# Patient Record
Sex: Female | Born: 1975 | Race: White | Hispanic: No | Smoking: Former smoker
Health system: Southern US, Community
[De-identification: ages and names within clinical notes are randomized; demographics above are authoritative.]

## PROBLEM LIST (undated history)

## (undated) DIAGNOSIS — N3010 Interstitial cystitis (chronic) without hematuria: Secondary | ICD-10-CM

## (undated) DIAGNOSIS — I639 Cerebral infarction, unspecified: Secondary | ICD-10-CM

## (undated) DIAGNOSIS — E785 Hyperlipidemia, unspecified: Secondary | ICD-10-CM

## (undated) DIAGNOSIS — Z889 Allergy status to unspecified drugs, medicaments and biological substances status: Secondary | ICD-10-CM

## (undated) DIAGNOSIS — J45909 Unspecified asthma, uncomplicated: Secondary | ICD-10-CM

## (undated) DIAGNOSIS — N200 Calculus of kidney: Secondary | ICD-10-CM

## (undated) DIAGNOSIS — Z8489 Family history of other specified conditions: Secondary | ICD-10-CM

## (undated) DIAGNOSIS — N809 Endometriosis, unspecified: Secondary | ICD-10-CM

## (undated) HISTORY — PX: CERVICAL BIOPSY  W/ LOOP ELECTRODE EXCISION: SUR135

## (undated) HISTORY — DX: Cerebral infarction, unspecified: I63.9

## (undated) HISTORY — PX: TUBAL LIGATION: SHX77

## (undated) HISTORY — PX: COLPOSCOPY: SHX161

## (undated) HISTORY — DX: Hyperlipidemia, unspecified: E78.5

## (undated) HISTORY — PX: LEEP: SHX91

## (undated) HISTORY — PX: ABLATION: SHX5711

## (undated) HISTORY — PX: RHINOPLASTY: SUR1284

## (undated) HISTORY — PX: CHOLECYSTECTOMY: SHX55

## (undated) HISTORY — PX: KNEE SURGERY: SHX244

## (undated) HISTORY — PX: TONSILLECTOMY: SUR1361

## (undated) HISTORY — DX: Unspecified asthma, uncomplicated: J45.909

## (undated) HISTORY — PX: CYSTOSTOMY W/ BLADDER BIOPSY: SHX1431

## (undated) HISTORY — PX: ABDOMINAL HYSTERECTOMY: SHX81

---

## 1998-10-26 ENCOUNTER — Emergency Department (HOSPITAL_COMMUNITY): Admission: EM | Admit: 1998-10-26 | Discharge: 1998-10-26 | Payer: Self-pay | Admitting: Emergency Medicine

## 1999-02-25 ENCOUNTER — Emergency Department (HOSPITAL_COMMUNITY): Admission: EM | Admit: 1999-02-25 | Discharge: 1999-02-25 | Payer: Self-pay | Admitting: Internal Medicine

## 1999-06-20 ENCOUNTER — Other Ambulatory Visit: Admission: RE | Admit: 1999-06-20 | Discharge: 1999-06-20 | Payer: Self-pay | Admitting: Obstetrics and Gynecology

## 2000-01-29 ENCOUNTER — Inpatient Hospital Stay (HOSPITAL_COMMUNITY): Admission: AD | Admit: 2000-01-29 | Discharge: 2000-01-31 | Payer: Self-pay | Admitting: *Deleted

## 2000-05-08 ENCOUNTER — Emergency Department (HOSPITAL_COMMUNITY): Admission: EM | Admit: 2000-05-08 | Discharge: 2000-05-08 | Payer: Self-pay | Admitting: Emergency Medicine

## 2000-11-26 ENCOUNTER — Other Ambulatory Visit: Admission: RE | Admit: 2000-11-26 | Discharge: 2000-11-26 | Payer: Self-pay | Admitting: Obstetrics and Gynecology

## 2000-12-13 ENCOUNTER — Emergency Department (HOSPITAL_COMMUNITY): Admission: EM | Admit: 2000-12-13 | Discharge: 2000-12-13 | Payer: Self-pay | Admitting: Emergency Medicine

## 2001-03-10 ENCOUNTER — Emergency Department (HOSPITAL_COMMUNITY): Admission: EM | Admit: 2001-03-10 | Discharge: 2001-03-10 | Payer: Self-pay | Admitting: Emergency Medicine

## 2001-08-24 ENCOUNTER — Encounter: Payer: Self-pay | Admitting: Emergency Medicine

## 2001-08-24 ENCOUNTER — Emergency Department (HOSPITAL_COMMUNITY): Admission: EM | Admit: 2001-08-24 | Discharge: 2001-08-24 | Payer: Self-pay | Admitting: Emergency Medicine

## 2001-11-26 ENCOUNTER — Other Ambulatory Visit: Admission: RE | Admit: 2001-11-26 | Discharge: 2001-11-26 | Payer: Self-pay | Admitting: Obstetrics & Gynecology

## 2002-01-01 ENCOUNTER — Encounter (INDEPENDENT_AMBULATORY_CARE_PROVIDER_SITE_OTHER): Payer: Self-pay

## 2002-01-01 ENCOUNTER — Observation Stay (HOSPITAL_COMMUNITY): Admission: AD | Admit: 2002-01-01 | Discharge: 2002-01-01 | Payer: Self-pay | Admitting: Obstetrics and Gynecology

## 2002-01-21 ENCOUNTER — Emergency Department (HOSPITAL_COMMUNITY): Admission: EM | Admit: 2002-01-21 | Discharge: 2002-01-21 | Payer: Self-pay | Admitting: Emergency Medicine

## 2002-11-15 ENCOUNTER — Emergency Department (HOSPITAL_COMMUNITY): Admission: EM | Admit: 2002-11-15 | Discharge: 2002-11-16 | Payer: Self-pay | Admitting: Emergency Medicine

## 2002-11-16 ENCOUNTER — Encounter: Payer: Self-pay | Admitting: Emergency Medicine

## 2002-11-18 ENCOUNTER — Encounter: Payer: Self-pay | Admitting: Internal Medicine

## 2002-11-18 LAB — CONVERTED CEMR LAB

## 2002-12-21 ENCOUNTER — Inpatient Hospital Stay (HOSPITAL_COMMUNITY): Admission: EM | Admit: 2002-12-21 | Discharge: 2002-12-29 | Payer: Self-pay | Admitting: Psychiatry

## 2003-03-07 ENCOUNTER — Emergency Department (HOSPITAL_COMMUNITY): Admission: EM | Admit: 2003-03-07 | Discharge: 2003-03-08 | Payer: Self-pay

## 2003-11-22 ENCOUNTER — Emergency Department (HOSPITAL_COMMUNITY): Admission: EM | Admit: 2003-11-22 | Discharge: 2003-11-22 | Payer: Self-pay | Admitting: Emergency Medicine

## 2004-07-29 ENCOUNTER — Emergency Department (HOSPITAL_COMMUNITY): Admission: EM | Admit: 2004-07-29 | Discharge: 2004-07-29 | Payer: Self-pay | Admitting: Emergency Medicine

## 2004-08-23 ENCOUNTER — Emergency Department (HOSPITAL_COMMUNITY): Admission: EM | Admit: 2004-08-23 | Discharge: 2004-08-24 | Payer: Self-pay | Admitting: *Deleted

## 2004-10-23 ENCOUNTER — Emergency Department (HOSPITAL_COMMUNITY): Admission: EM | Admit: 2004-10-23 | Discharge: 2004-10-23 | Payer: Self-pay | Admitting: Emergency Medicine

## 2005-02-06 ENCOUNTER — Emergency Department (HOSPITAL_COMMUNITY): Admission: EM | Admit: 2005-02-06 | Discharge: 2005-02-06 | Payer: Self-pay | Admitting: Emergency Medicine

## 2005-02-20 ENCOUNTER — Emergency Department (HOSPITAL_COMMUNITY): Admission: EM | Admit: 2005-02-20 | Discharge: 2005-02-21 | Payer: Self-pay | Admitting: Emergency Medicine

## 2005-02-25 ENCOUNTER — Ambulatory Visit: Payer: Self-pay | Admitting: Internal Medicine

## 2005-03-01 ENCOUNTER — Encounter: Admission: RE | Admit: 2005-03-01 | Discharge: 2005-03-01 | Payer: Self-pay | Admitting: Internal Medicine

## 2005-04-26 ENCOUNTER — Ambulatory Visit: Payer: Self-pay | Admitting: Internal Medicine

## 2005-05-02 ENCOUNTER — Other Ambulatory Visit: Admission: RE | Admit: 2005-05-02 | Discharge: 2005-05-02 | Payer: Self-pay | Admitting: Obstetrics and Gynecology

## 2005-05-03 ENCOUNTER — Other Ambulatory Visit: Admission: RE | Admit: 2005-05-03 | Discharge: 2005-05-03 | Payer: Self-pay | Admitting: Obstetrics and Gynecology

## 2005-06-08 ENCOUNTER — Observation Stay (HOSPITAL_COMMUNITY): Admission: AD | Admit: 2005-06-08 | Discharge: 2005-06-09 | Payer: Self-pay | Admitting: Obstetrics and Gynecology

## 2005-10-01 ENCOUNTER — Ambulatory Visit (HOSPITAL_COMMUNITY): Admission: RE | Admit: 2005-10-01 | Discharge: 2005-10-01 | Payer: Self-pay | Admitting: Obstetrics and Gynecology

## 2005-10-11 ENCOUNTER — Inpatient Hospital Stay (HOSPITAL_COMMUNITY): Admission: AD | Admit: 2005-10-11 | Discharge: 2005-10-11 | Payer: Self-pay | Admitting: Obstetrics and Gynecology

## 2005-12-14 ENCOUNTER — Inpatient Hospital Stay (HOSPITAL_COMMUNITY): Admission: AD | Admit: 2005-12-14 | Discharge: 2005-12-14 | Payer: Self-pay | Admitting: Obstetrics and Gynecology

## 2005-12-19 ENCOUNTER — Inpatient Hospital Stay (HOSPITAL_COMMUNITY): Admission: AD | Admit: 2005-12-19 | Discharge: 2005-12-22 | Payer: Self-pay | Admitting: Obstetrics and Gynecology

## 2005-12-20 ENCOUNTER — Encounter (INDEPENDENT_AMBULATORY_CARE_PROVIDER_SITE_OTHER): Payer: Self-pay | Admitting: *Deleted

## 2006-01-02 ENCOUNTER — Ambulatory Visit (HOSPITAL_COMMUNITY): Admission: RE | Admit: 2006-01-02 | Discharge: 2006-01-02 | Payer: Self-pay | Admitting: Obstetrics and Gynecology

## 2006-02-21 ENCOUNTER — Emergency Department (HOSPITAL_COMMUNITY): Admission: EM | Admit: 2006-02-21 | Discharge: 2006-02-21 | Payer: Self-pay | Admitting: Emergency Medicine

## 2006-03-19 ENCOUNTER — Ambulatory Visit: Payer: Self-pay | Admitting: Cardiology

## 2006-03-19 ENCOUNTER — Ambulatory Visit: Payer: Self-pay | Admitting: Internal Medicine

## 2006-03-24 ENCOUNTER — Ambulatory Visit (HOSPITAL_BASED_OUTPATIENT_CLINIC_OR_DEPARTMENT_OTHER): Admission: RE | Admit: 2006-03-24 | Discharge: 2006-03-24 | Payer: Self-pay | Admitting: Urology

## 2006-03-27 ENCOUNTER — Ambulatory Visit (HOSPITAL_COMMUNITY): Admission: RE | Admit: 2006-03-27 | Discharge: 2006-03-27 | Payer: Self-pay | Admitting: Urology

## 2006-04-11 ENCOUNTER — Emergency Department (HOSPITAL_COMMUNITY): Admission: EM | Admit: 2006-04-11 | Discharge: 2006-04-11 | Payer: Self-pay | Admitting: Emergency Medicine

## 2006-09-16 ENCOUNTER — Ambulatory Visit: Payer: Self-pay | Admitting: Internal Medicine

## 2006-10-17 ENCOUNTER — Ambulatory Visit: Payer: Self-pay | Admitting: Internal Medicine

## 2006-10-17 LAB — CONVERTED CEMR LAB
ALT: 11 units/L (ref 0–40)
AST: 14 units/L (ref 0–37)
Alkaline Phosphatase: 51 units/L (ref 39–117)
Basophils Relative: 0.9 % (ref 0.0–1.0)
Bilirubin Urine: NEGATIVE
Calcium: 9.2 mg/dL (ref 8.4–10.5)
Chloride: 107 meq/L (ref 96–112)
Cholesterol: 220 mg/dL (ref 0–200)
Crystals: NEGATIVE
Eosinophil percent: 9 % — ABNORMAL HIGH (ref 0.0–5.0)
GFR calc non Af Amer: 89 mL/min
Glomerular Filtration Rate, Af Am: 108 mL/min/{1.73_m2}
Glucose, Bld: 93 mg/dL (ref 70–99)
HCT: 43.5 % (ref 36.0–46.0)
HDL: 40.6 mg/dL (ref >39.0)
Hemoglobin, Urine: NEGATIVE
LDL DIRECT: 172.1 mg/dL
MCV: 88 fL (ref 78.0–100.0)
Mucus, UA: NEGATIVE
Nitrite: NEGATIVE
Platelets: 269 10*3/uL (ref 150–400)
Potassium: 4.6 meq/L (ref 3.5–5.1)
RBC: 4.95 M/uL (ref 3.87–5.11)
Sodium: 140 meq/L (ref 135–145)
Total Protein, Urine: NEGATIVE mg/dL
Total Protein: 6.5 g/dL (ref 6.0–8.3)
WBC: 6.1 10*3/uL (ref 4.5–10.5)
pH: 7.5 (ref 5.0–8.0)

## 2006-10-23 ENCOUNTER — Ambulatory Visit: Payer: Self-pay | Admitting: Internal Medicine

## 2006-12-22 ENCOUNTER — Emergency Department (HOSPITAL_COMMUNITY): Admission: EM | Admit: 2006-12-22 | Discharge: 2006-12-22 | Payer: Self-pay | Admitting: Emergency Medicine

## 2007-01-30 ENCOUNTER — Ambulatory Visit (HOSPITAL_COMMUNITY): Admission: RE | Admit: 2007-01-30 | Discharge: 2007-01-30 | Payer: Self-pay | Admitting: Obstetrics and Gynecology

## 2007-02-25 ENCOUNTER — Ambulatory Visit (HOSPITAL_COMMUNITY): Admission: RE | Admit: 2007-02-25 | Discharge: 2007-02-25 | Payer: Self-pay | Admitting: Obstetrics and Gynecology

## 2007-02-25 ENCOUNTER — Encounter (INDEPENDENT_AMBULATORY_CARE_PROVIDER_SITE_OTHER): Payer: Self-pay | Admitting: *Deleted

## 2007-07-23 ENCOUNTER — Ambulatory Visit: Payer: Self-pay | Admitting: Internal Medicine

## 2007-07-30 ENCOUNTER — Encounter: Payer: Self-pay | Admitting: Internal Medicine

## 2007-07-30 DIAGNOSIS — F3289 Other specified depressive episodes: Secondary | ICD-10-CM | POA: Insufficient documentation

## 2007-07-30 DIAGNOSIS — G4700 Insomnia, unspecified: Secondary | ICD-10-CM

## 2007-07-30 DIAGNOSIS — G43909 Migraine, unspecified, not intractable, without status migrainosus: Secondary | ICD-10-CM | POA: Insufficient documentation

## 2007-07-30 DIAGNOSIS — F329 Major depressive disorder, single episode, unspecified: Secondary | ICD-10-CM

## 2007-07-30 DIAGNOSIS — N3010 Interstitial cystitis (chronic) without hematuria: Secondary | ICD-10-CM | POA: Insufficient documentation

## 2007-07-30 DIAGNOSIS — E785 Hyperlipidemia, unspecified: Secondary | ICD-10-CM | POA: Insufficient documentation

## 2007-07-30 DIAGNOSIS — J309 Allergic rhinitis, unspecified: Secondary | ICD-10-CM | POA: Insufficient documentation

## 2007-07-30 DIAGNOSIS — J45909 Unspecified asthma, uncomplicated: Secondary | ICD-10-CM

## 2007-07-30 DIAGNOSIS — F32A Depression, unspecified: Secondary | ICD-10-CM | POA: Insufficient documentation

## 2007-07-30 DIAGNOSIS — R609 Edema, unspecified: Secondary | ICD-10-CM

## 2007-07-30 DIAGNOSIS — F419 Anxiety disorder, unspecified: Secondary | ICD-10-CM

## 2007-08-06 ENCOUNTER — Emergency Department (HOSPITAL_COMMUNITY): Admission: EM | Admit: 2007-08-06 | Discharge: 2007-08-06 | Payer: Self-pay

## 2007-09-14 ENCOUNTER — Emergency Department (HOSPITAL_COMMUNITY): Admission: EM | Admit: 2007-09-14 | Discharge: 2007-09-14 | Payer: Self-pay | Admitting: Emergency Medicine

## 2007-10-06 ENCOUNTER — Emergency Department (HOSPITAL_COMMUNITY): Admission: EM | Admit: 2007-10-06 | Discharge: 2007-10-06 | Payer: Self-pay | Admitting: Emergency Medicine

## 2007-10-19 DIAGNOSIS — Z87442 Personal history of urinary calculi: Secondary | ICD-10-CM | POA: Insufficient documentation

## 2007-11-24 ENCOUNTER — Emergency Department (HOSPITAL_COMMUNITY): Admission: EM | Admit: 2007-11-24 | Discharge: 2007-11-24 | Payer: Self-pay | Admitting: Emergency Medicine

## 2007-12-29 ENCOUNTER — Emergency Department (HOSPITAL_COMMUNITY): Admission: EM | Admit: 2007-12-29 | Discharge: 2007-12-29 | Payer: Self-pay | Admitting: Emergency Medicine

## 2008-01-06 ENCOUNTER — Emergency Department (HOSPITAL_COMMUNITY): Admission: EM | Admit: 2008-01-06 | Discharge: 2008-01-06 | Payer: Self-pay | Admitting: Emergency Medicine

## 2008-04-07 ENCOUNTER — Ambulatory Visit: Payer: Self-pay | Admitting: Internal Medicine

## 2008-04-07 DIAGNOSIS — R109 Unspecified abdominal pain: Secondary | ICD-10-CM

## 2008-04-07 LAB — CONVERTED CEMR LAB
Bilirubin Urine: NEGATIVE
CO2: 29 meq/L (ref 19–32)
Calcium: 9.4 mg/dL (ref 8.4–10.5)
Creatinine, Ser: 0.8 mg/dL (ref 0.4–1.2)
Crystals: NEGATIVE
GFR calc Af Amer: 107 mL/min
Hemoglobin, Urine: NEGATIVE
Nitrite: NEGATIVE
Urobilinogen, UA: 0.2 (ref 0.0–1.0)

## 2008-04-13 ENCOUNTER — Encounter: Payer: Self-pay | Admitting: Internal Medicine

## 2008-06-23 ENCOUNTER — Emergency Department (HOSPITAL_COMMUNITY): Admission: EM | Admit: 2008-06-23 | Discharge: 2008-06-23 | Payer: Self-pay | Admitting: Emergency Medicine

## 2008-06-29 ENCOUNTER — Emergency Department (HOSPITAL_COMMUNITY): Admission: EM | Admit: 2008-06-29 | Discharge: 2008-06-29 | Payer: Self-pay | Admitting: Emergency Medicine

## 2008-09-05 ENCOUNTER — Emergency Department (HOSPITAL_COMMUNITY): Admission: EM | Admit: 2008-09-05 | Discharge: 2008-09-06 | Payer: Self-pay | Admitting: Emergency Medicine

## 2008-11-28 ENCOUNTER — Ambulatory Visit: Payer: Self-pay | Admitting: Internal Medicine

## 2008-12-07 ENCOUNTER — Telehealth (INDEPENDENT_AMBULATORY_CARE_PROVIDER_SITE_OTHER): Payer: Self-pay | Admitting: *Deleted

## 2008-12-07 ENCOUNTER — Emergency Department (HOSPITAL_COMMUNITY): Admission: EM | Admit: 2008-12-07 | Discharge: 2008-12-07 | Payer: Self-pay | Admitting: Emergency Medicine

## 2008-12-08 ENCOUNTER — Ambulatory Visit: Payer: Self-pay | Admitting: Internal Medicine

## 2008-12-08 ENCOUNTER — Encounter (INDEPENDENT_AMBULATORY_CARE_PROVIDER_SITE_OTHER): Payer: Self-pay | Admitting: *Deleted

## 2008-12-13 ENCOUNTER — Ambulatory Visit: Payer: Self-pay | Admitting: Internal Medicine

## 2008-12-13 LAB — CONVERTED CEMR LAB: LDL Goal: 172 mg/dL

## 2008-12-30 ENCOUNTER — Telehealth: Payer: Self-pay | Admitting: Internal Medicine

## 2009-01-17 ENCOUNTER — Encounter: Payer: Self-pay | Admitting: Internal Medicine

## 2009-04-09 IMAGING — CT CT PELVIS W/O CM
1 of 2 series · 15 of 32 positions shown, 19 images · IV contrast (agent unspecified)
Comparison: 03/27/06.

CLINICAL DATA: Left flank pain since 3 a.m.  Assess for stone.  History of previous bladder repair.
 ABDOMEN CT WITHOUT CONTRAST:
TECHNIQUE: Multidetector CT imaging of the abdomen was performed following the standard protocol without IV contrast.
TECHNIQUE: Multidetector CT imaging of the pelvis was performed following the standard protocol without IV contrast.

[Series 2: 160 stone 5.0 b40f st · axial · 0.77mm/px · z∈[-585,-225]mm · 15 of 81 slices shown, 19 images]
[im 6/81  soft-tissue]
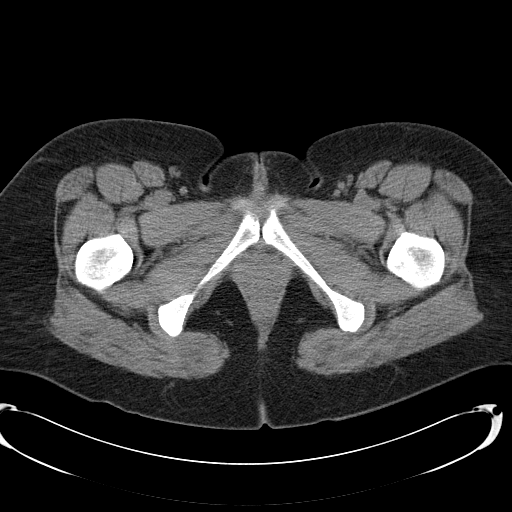
[im 6/81  bone]
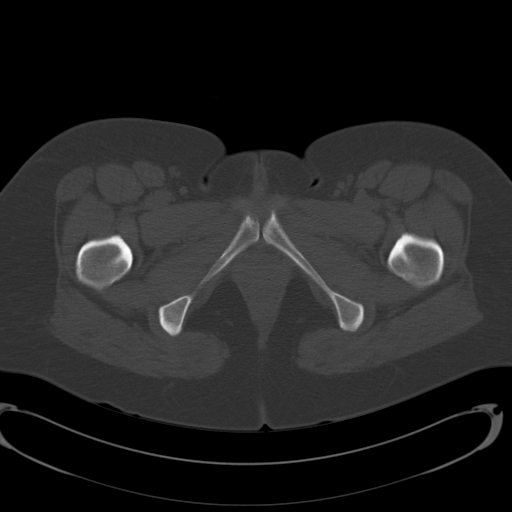
[im 12/81  soft-tissue]
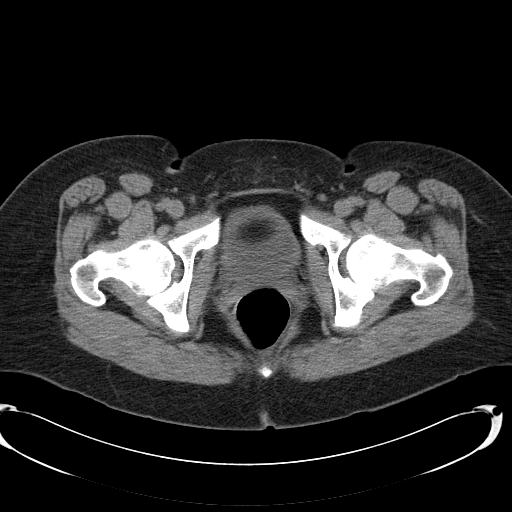
[im 18/81  soft-tissue]
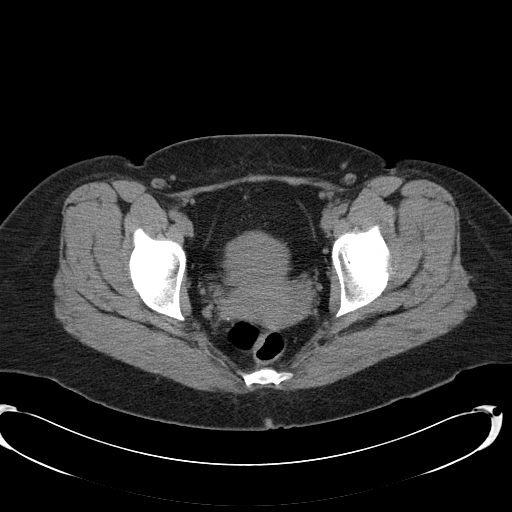
[im 23/81  soft-tissue]
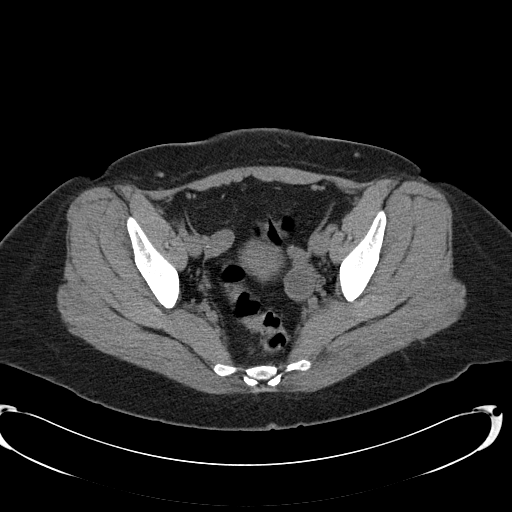
[im 29/81  soft-tissue]
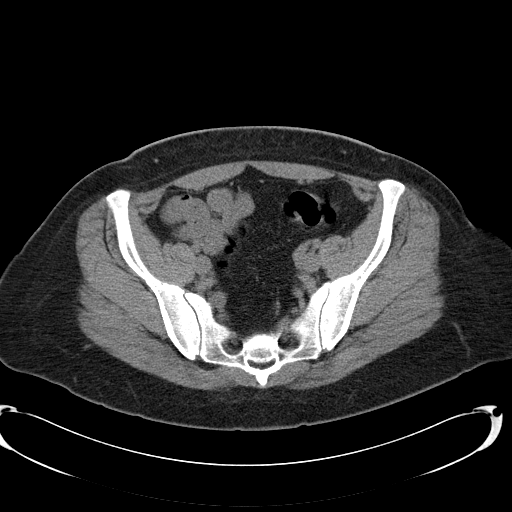
[im 35/81  soft-tissue]
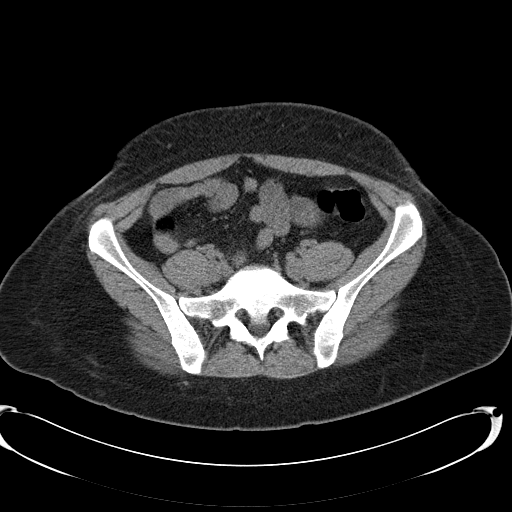
[im 41/81  soft-tissue]
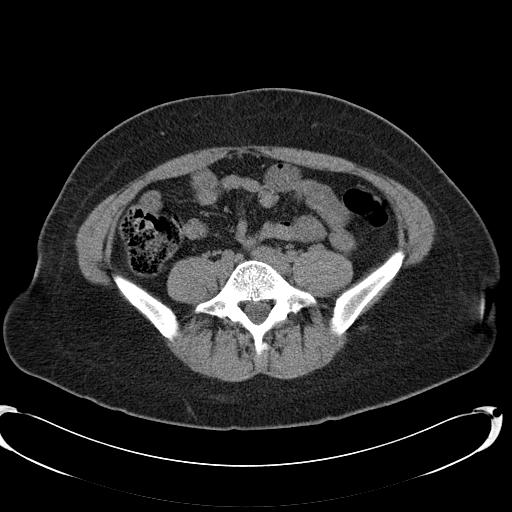
[im 46/81  soft-tissue]
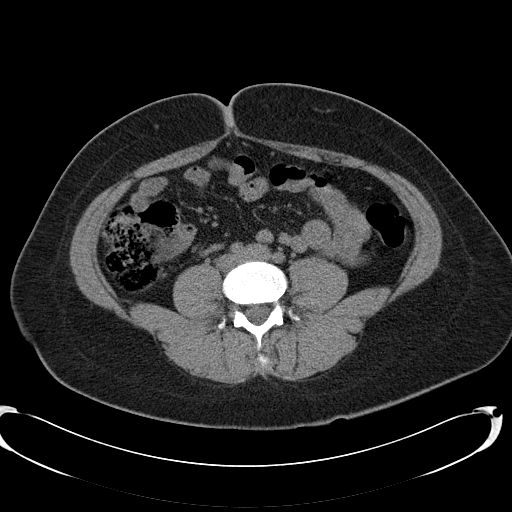
[im 52/81  soft-tissue]
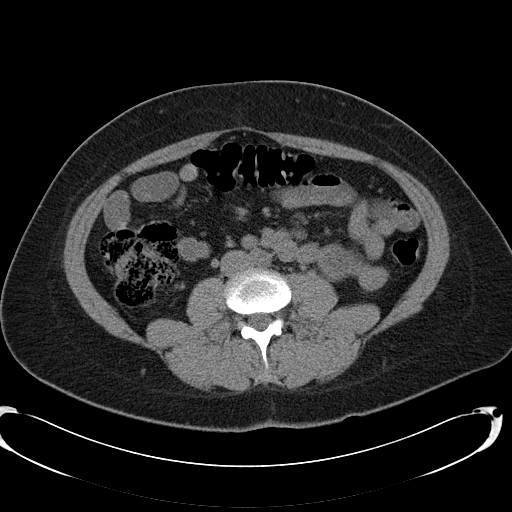
[im 52/81  bone]
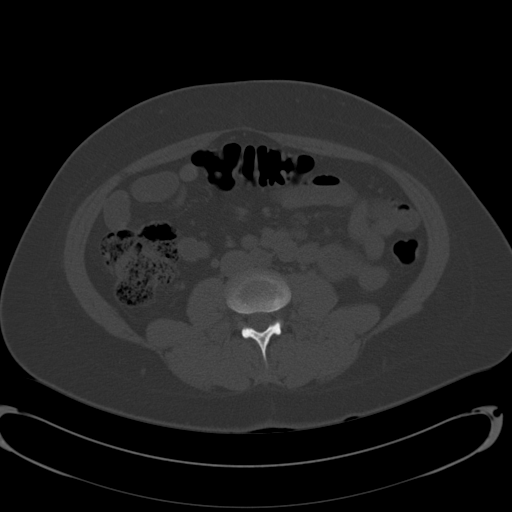
[im 58/81  soft-tissue]
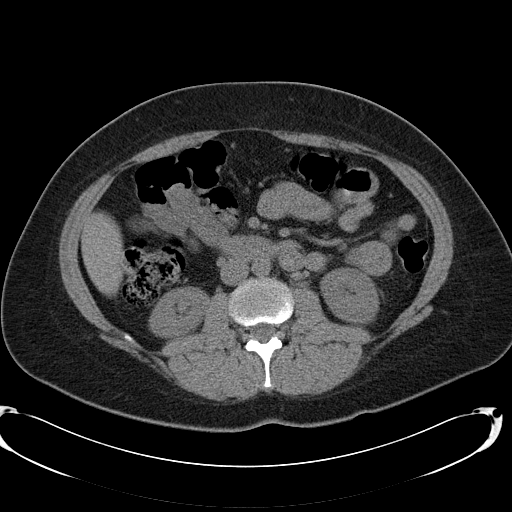
[im 63/81  soft-tissue]
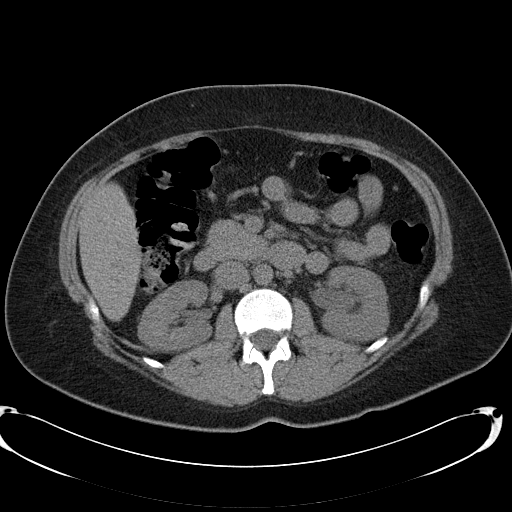
[im 69/81  soft-tissue]
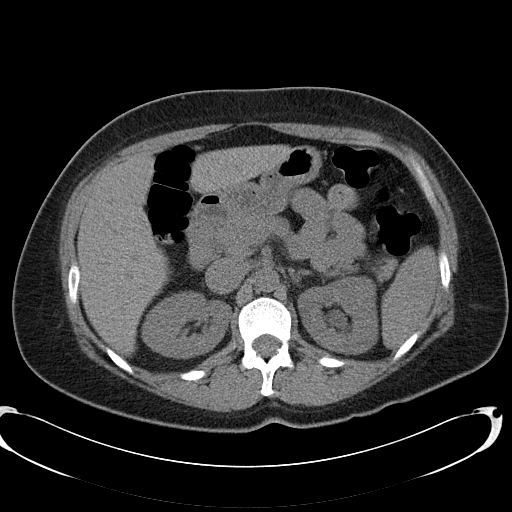
[im 69/81  lung]
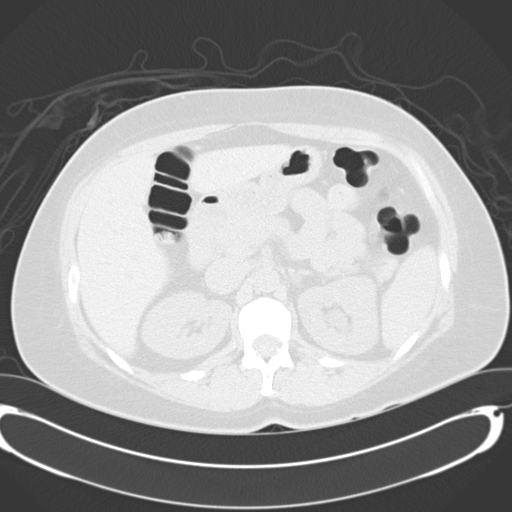
[im 72/81  lung]
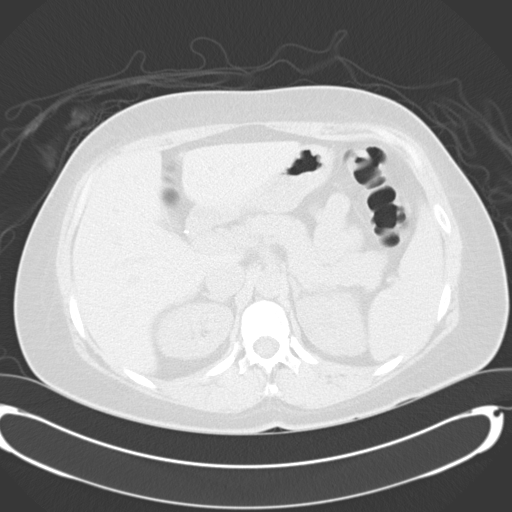
[im 75/81  soft-tissue]
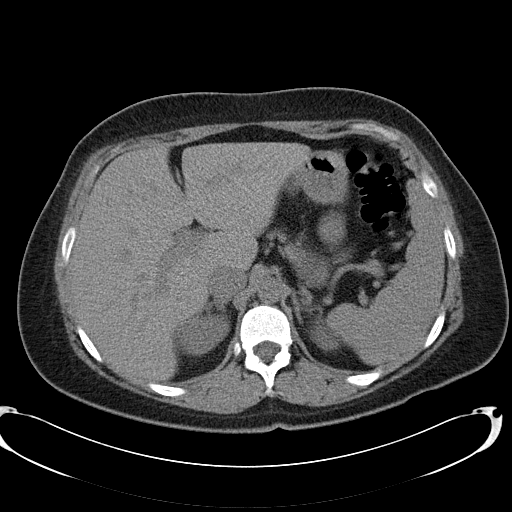
[im 75/81  lung]
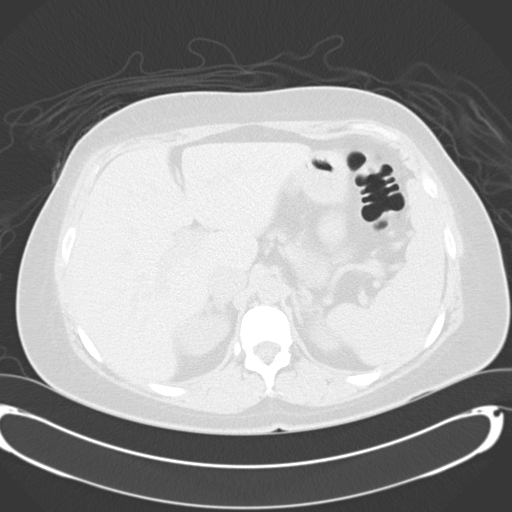
[im 78/81  lung]
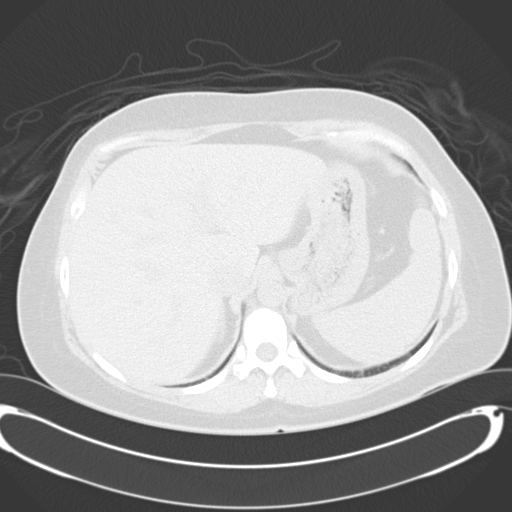

[15 of 32 positions shown; findings below may reference images not displayed]

FINDINGS: The visualized portion of the liver appears normal.  The patient has had cholecystectomy.  The spleen is normal in size, shape, and position.  The pancreas is unremarkable.  Both adrenal glands are normal.  The right kidney is normal.  The left kidney is swollen and shows hydronephrosis of a moderate degree. There is a 1-2 mm stone in an upper pole calix.  There is a 3 mm stone within the proximal ureter apparently responsible for the obstruction.
 The aorta and IVC are unremarkable.  No mass or adenopathy.  No free fluid or air.  No primary bowel pathology is evident.
IMPRESSION: Moderate hydronephrosis on the left because of a 3 mm stone in the proximal left ureter.  There is a 1-2 mm stone in an upper pole calix on the left, nonobstructive.
 PELVIS CT WITHOUT CONTRAST:
FINDINGS: No free fluid, mass, or adenopathy.  No sign of a stone in the pelvic portion of the examination.  There are a few phleboliths which appear the same as was seen previously.  There is a cyst associated with the left ovary measuring a few centimeters in size.
IMPRESSION: No urinary tract stone disease evident in the pelvis.  2 cm left ovarian cyst.

## 2009-05-26 ENCOUNTER — Ambulatory Visit: Payer: Self-pay | Admitting: Internal Medicine

## 2009-05-26 DIAGNOSIS — D239 Other benign neoplasm of skin, unspecified: Secondary | ICD-10-CM | POA: Insufficient documentation

## 2009-05-26 DIAGNOSIS — F17210 Nicotine dependence, cigarettes, uncomplicated: Secondary | ICD-10-CM

## 2009-08-04 ENCOUNTER — Telehealth: Payer: Self-pay | Admitting: Internal Medicine

## 2009-08-26 ENCOUNTER — Emergency Department (HOSPITAL_COMMUNITY): Admission: EM | Admit: 2009-08-26 | Discharge: 2009-08-26 | Payer: Self-pay | Admitting: Emergency Medicine

## 2010-03-06 ENCOUNTER — Emergency Department (HOSPITAL_COMMUNITY): Admission: EM | Admit: 2010-03-06 | Discharge: 2010-03-06 | Payer: Self-pay | Admitting: Emergency Medicine

## 2010-06-11 ENCOUNTER — Emergency Department (HOSPITAL_COMMUNITY): Admission: EM | Admit: 2010-06-11 | Discharge: 2010-06-11 | Payer: Self-pay | Admitting: Emergency Medicine

## 2010-06-18 ENCOUNTER — Emergency Department (HOSPITAL_COMMUNITY): Admission: EM | Admit: 2010-06-18 | Discharge: 2010-06-18 | Payer: Self-pay | Admitting: Family Medicine

## 2010-09-14 ENCOUNTER — Emergency Department (HOSPITAL_COMMUNITY): Admission: EM | Admit: 2010-09-14 | Discharge: 2010-09-14 | Payer: Self-pay | Admitting: Emergency Medicine

## 2010-12-09 ENCOUNTER — Encounter: Payer: Self-pay | Admitting: Internal Medicine

## 2011-01-17 ENCOUNTER — Emergency Department (HOSPITAL_COMMUNITY)
Admission: EM | Admit: 2011-01-17 | Discharge: 2011-01-18 | Disposition: A | Discharge status: Home / Self Care | Payer: Medicaid Other | Attending: Emergency Medicine | Admitting: Emergency Medicine

## 2011-01-17 ENCOUNTER — Emergency Department (HOSPITAL_COMMUNITY): Payer: Medicaid Other

## 2011-01-17 DIAGNOSIS — Z8742 Personal history of other diseases of the female genital tract: Secondary | ICD-10-CM | POA: Insufficient documentation

## 2011-01-17 DIAGNOSIS — N72 Inflammatory disease of cervix uteri: Secondary | ICD-10-CM | POA: Insufficient documentation

## 2011-01-17 DIAGNOSIS — R1032 Left lower quadrant pain: Secondary | ICD-10-CM | POA: Insufficient documentation

## 2011-01-17 DIAGNOSIS — R112 Nausea with vomiting, unspecified: Secondary | ICD-10-CM | POA: Insufficient documentation

## 2011-01-17 DIAGNOSIS — Z87442 Personal history of urinary calculi: Secondary | ICD-10-CM | POA: Insufficient documentation

## 2011-01-17 LAB — DIFFERENTIAL
Basophils Absolute: 0.1 10*3/uL (ref 0.0–0.1)
Basophils Relative: 1 % (ref 0–1)
Neutro Abs: 4.4 10*3/uL (ref 1.7–7.7)
Neutrophils Relative %: 45 % (ref 43–77)

## 2011-01-17 LAB — URINE MICROSCOPIC-ADD ON

## 2011-01-17 LAB — URINALYSIS, ROUTINE W REFLEX MICROSCOPIC
Bilirubin Urine: NEGATIVE
Hgb urine dipstick: NEGATIVE
Ketones, ur: NEGATIVE mg/dL
Specific Gravity, Urine: 1.018 (ref 1.005–1.030)
Urobilinogen, UA: 1 mg/dL (ref 0.0–1.0)

## 2011-01-17 LAB — BASIC METABOLIC PANEL
CO2: 23 mEq/L (ref 19–32)
Chloride: 108 mEq/L (ref 96–112)
GFR calc Af Amer: >60 mL/min (ref >60)
Potassium: 3.5 mEq/L (ref 3.5–5.1)
Sodium: 140 mEq/L (ref 135–145)

## 2011-01-17 LAB — CBC
Hemoglobin: 15.6 g/dL — ABNORMAL HIGH (ref 12.0–15.0)
RBC: 5.05 MIL/uL (ref 3.87–5.11)

## 2011-01-17 LAB — WET PREP, GENITAL
Clue Cells Wet Prep HPF POC: NONE SEEN
Trich, Wet Prep: NONE SEEN

## 2011-01-18 LAB — GC/CHLAMYDIA PROBE AMP, GENITAL
Chlamydia, DNA Probe: NEGATIVE
GC Probe Amp, Genital: NEGATIVE

## 2011-01-29 ENCOUNTER — Emergency Department (HOSPITAL_COMMUNITY)
Admission: EM | Admit: 2011-01-29 | Discharge: 2011-01-29 | Disposition: A | Discharge status: Home / Self Care | Payer: Medicaid Other | Attending: Emergency Medicine | Admitting: Emergency Medicine

## 2011-01-29 DIAGNOSIS — R112 Nausea with vomiting, unspecified: Secondary | ICD-10-CM | POA: Insufficient documentation

## 2011-01-29 DIAGNOSIS — R1032 Left lower quadrant pain: Secondary | ICD-10-CM | POA: Insufficient documentation

## 2011-01-29 LAB — CBC
MCV: 88.8 fL (ref 78.0–100.0)
Platelets: 212 10*3/uL (ref 150–400)
RDW: 12.5 % (ref 11.5–15.5)
WBC: 6.7 10*3/uL (ref 4.0–10.5)

## 2011-01-29 LAB — COMPREHENSIVE METABOLIC PANEL
Albumin: 4 g/dL (ref 3.5–5.2)
BUN: 7 mg/dL (ref 6–23)
CO2: 25 mEq/L (ref 19–32)
Chloride: 106 mEq/L (ref 96–112)
Creatinine, Ser: 0.85 mg/dL (ref 0.4–1.2)
GFR calc non Af Amer: >60 mL/min (ref >60)
Glucose, Bld: 93 mg/dL (ref 70–99)
Total Bilirubin: 0.6 mg/dL (ref 0.3–1.2)

## 2011-01-29 LAB — DIFFERENTIAL
Basophils Absolute: 0.1 10*3/uL (ref 0.0–0.1)
Eosinophils Absolute: 1 10*3/uL — ABNORMAL HIGH (ref 0.0–0.7)
Eosinophils Relative: 15 % — ABNORMAL HIGH (ref 0–5)
Lymphs Abs: 2.3 10*3/uL (ref 0.7–4.0)

## 2011-01-29 LAB — URINALYSIS, ROUTINE W REFLEX MICROSCOPIC
Bilirubin Urine: NEGATIVE
Nitrite: NEGATIVE
Protein, ur: NEGATIVE mg/dL
Specific Gravity, Urine: 1.015 (ref 1.005–1.030)
Urobilinogen, UA: 0.2 mg/dL (ref 0.0–1.0)

## 2011-01-29 LAB — LIPASE, BLOOD: Lipase: 28 U/L (ref 11–59)

## 2011-01-29 LAB — URINE MICROSCOPIC-ADD ON

## 2011-01-30 LAB — DIFFERENTIAL
Basophils Absolute: 0.1 K/uL (ref 0.0–0.1)
Basophils Relative: 1 % (ref 0–1)
Eosinophils Absolute: 1.3 K/uL — ABNORMAL HIGH (ref 0.0–0.7)
Eosinophils Relative: 14 % — ABNORMAL HIGH (ref 0–5)
Lymphocytes Relative: 40 % (ref 12–46)
Lymphs Abs: 3.6 10*3/uL (ref 0.7–4.0)
Monocytes Absolute: 0.5 10*3/uL (ref 0.1–1.0)
Monocytes Relative: 5 % (ref 3–12)
Neutro Abs: 3.4 10*3/uL (ref 1.7–7.7)
Neutrophils Relative %: 39 % — ABNORMAL LOW (ref 43–77)

## 2011-01-30 LAB — URINALYSIS, ROUTINE W REFLEX MICROSCOPIC
Bilirubin Urine: NEGATIVE
Glucose, UA: NEGATIVE mg/dL
Hgb urine dipstick: NEGATIVE
Ketones, ur: NEGATIVE mg/dL
Nitrite: NEGATIVE
Protein, ur: NEGATIVE mg/dL
Specific Gravity, Urine: 1.015 (ref 1.005–1.030)
Urobilinogen, UA: 0.2 mg/dL (ref 0.0–1.0)
pH: 6.5 (ref 5.0–8.0)

## 2011-01-30 LAB — BASIC METABOLIC PANEL WITH GFR
CO2: 24 meq/L (ref 19–32)
Chloride: 109 meq/L (ref 96–112)
GFR calc non Af Amer: >60 mL/min (ref >60)
Glucose, Bld: 97 mg/dL (ref 70–99)
Potassium: 3.5 meq/L (ref 3.5–5.1)
Sodium: 140 meq/L (ref 135–145)

## 2011-01-30 LAB — CBC
HCT: 42.6 % (ref 36.0–46.0)
Hemoglobin: 14.9 g/dL (ref 12.0–15.0)
MCH: 31.5 pg (ref 26.0–34.0)
MCHC: 34.9 g/dL (ref 30.0–36.0)
MCV: 90.3 fL (ref 78.0–100.0)
Platelets: 254 10*3/uL (ref 150–400)
RBC: 4.72 MIL/uL (ref 3.87–5.11)
RDW: 12.9 % (ref 11.5–15.5)
WBC: 8.8 10*3/uL (ref 4.0–10.5)

## 2011-01-30 LAB — LIPASE, BLOOD: Lipase: 30 U/L (ref 11–59)

## 2011-01-30 LAB — BASIC METABOLIC PANEL
BUN: 10 mg/dL (ref 6–23)
Calcium: 9 mg/dL (ref 8.4–10.5)
Creatinine, Ser: 0.73 mg/dL (ref 0.4–1.2)
GFR calc Af Amer: >60 mL/min (ref >60)

## 2011-03-04 LAB — URINALYSIS, ROUTINE W REFLEX MICROSCOPIC
Bilirubin Urine: NEGATIVE
Glucose, UA: NEGATIVE mg/dL
Hgb urine dipstick: NEGATIVE
Ketones, ur: NEGATIVE mg/dL
Nitrite: NEGATIVE
Protein, ur: NEGATIVE mg/dL
Specific Gravity, Urine: 1.02 (ref 1.005–1.030)
Urobilinogen, UA: 0.2 mg/dL (ref 0.0–1.0)
pH: 7 (ref 5.0–8.0)

## 2011-03-04 LAB — PREGNANCY, URINE: Preg Test, Ur: NEGATIVE

## 2011-04-05 NOTE — Op Note (Signed)
NAMEHANNAH, STRADER              ACCOUNT NO.:  [id number]   MEDICAL RECORD NO.:  [id number]          PATIENT TYPE:  INP   LOCATION:  9137                          FACILITY:  [facility]   PHYSICIAN:  Ilda Mori, M.D.   DATE OF BIRTH:  May 19, 1976   DATE OF PROCEDURE:  12/20/2005  DATE OF DISCHARGE:                                 OPERATIVE REPORT   PREOPERATIVE DIAGNOSES:  Multiparity and voluntary sterilization.   POSTOPERATIVE DIAGNOSIS:  Multiparity and voluntary sterilization.   PROCEDURE:  Postpartum bilateral partial salpingectomy for sterilization.   SURGEON:  Dr. Ilda Mori.   ANESTHESIA:  Epidural.   ESTIMATED BLOOD LOSS:  Minimal.   FINDINGS:  Normal-appearing fallopian tubes.   COMPLICATIONS:  None.   SPECIMENS:  Portions of right and left fallopian tube.   INDICATIONS:  This is a 35 year old, gravida 3, para 3 who is approximately  3 hours status post spontaneous vaginal delivery who requested permanent  sterilization. A failure rate of 2 -03/999 was discussed with the patient.  In addition, the patient was informed that this was a permanent procedure  and that there were other forms of birth control that were not permanent.  The patient understood all of this information and elected to proceed   DESCRIPTION OF PROCEDURE:  The patient was taken to the operating room where  epidural anesthesia was reinjected for surgical anesthesia. The abdomen was  prepped and draped in sterile fashion. A subumbilical transverse incision  was made and carried down to the fascia which was grasped with Kocher  clamps, and elevated and incised sharply and extended by sharp and blunt  dissection. The peritoneum was then entered bluntly. The fallopian tube was  identified on the right, grasped with a Babcock clamp and traced to the  fimbriated end. The mid portion of the tube was then elevated and doubly  ligated with plain #0 catgut suture. A portion of tube was then excised for  pathological confirmation. The identical procedure was then carried out on  the left fallopian tube. The peritoneum and fascia was then closed with  running #0 Vicryl suture and the skin was closed with Dermabond adhesive.  The patient tolerated the procedure well and left the operating room in good  condition.     Ilda Mori, M.D.  Electronically Signed    RK/MEDQ  D:  12/20/2005  T:  12/20/2005  Job:  [id number]
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
## 2011-04-05 NOTE — Discharge Summary (Signed)
NAME:  Brandi Morris, Brandi Morris                        ACCOUNT NO.:  [id number]   MEDICAL RECORD NO.:  [id number]                   PATIENT TYPE:  IPS   LOCATION:  0302                                 [facility]ACILITY:  [facility]   PHYSICIAN:  Jeanice Lim, M.D.              DATE OF BIRTH:  1976-09-29   DATE OF ADMISSION:  12/21/2002  DATE OF DISCHARGE:  12/29/2002                                 DISCHARGE SUMMARY   IDENTIFYING DATA:  This is a 35 year old married Caucasian female,  voluntarily admitted, presenting with a history of depression, anxiety and  suicidal thoughts to drive a truck into a ditch or telephone pole.   ADMISSION MEDICATIONS:  Lexapro, Xanax.   ALLERGIES:  LODINE, AMITRIPTYLINE.   PHYSICAL EXAMINATION:  Essentially within normal limits, neurologically  nonfocal.   ROUTINE ADMISSION LABS:  Essentially within normal limits, including CBC,  CMET, and TSH.  Urine pregnancy test negative.  Urine drug screen negative.   MENTAL STATUS EXAM:  Alert, oriented, cooperative female, casually dressed.  Speech was clear, polite, mood depressed, anxious teary eyed.  Thought  process goal directed, thought content negative for psychotic symptoms or  dangerous ideation, cognitively intact.  Judgment and insight fair.   ADMISSION DIAGNOSES:   AXIS I:  1. Major depressive disorder, severe, single episode.  2. Rule out bipolar disorder, mixed.   AXIS II:  None.   AXIS III:  Asthma and tobacco abuse.   AXIS IV:  Moderate, problems related to support system.   AXIS V:  30/70.   HOSPITAL COURSE:  The patient was admitted and ordered routine p.r.n.  medications, underwent further monitoring, and was encouraged to participate  in individual, group and milieu therapy.  The patient was continued on  albuterol, Ambien, Lexapro, and Ativan initially, and Seroquel was added for  acute agitation.  The patient was quite sedated after taking low-dose  Seroquel.  The patient was given Motrin for  headache.  Lexapro was titrated  and stopped, and the patient was placed on Trileptal to stabilize mood and  Darvocet for headache.  The patient reported a positive response to  optimizing Trileptal and Neurontin was added for anxiety, along with mood  swings.  The patient described a clear history of hypomania and depression  and rage episodes, feeling violent, and reported a positive response to  medication changes and clinical intervention.   CONDITION ON DISCHARGE:  Markedly improved.  Mood was more euthymic, affect  brighter, thought process goal directed.  Thought content negative for  dangerous ideation or psychotic symptoms.  The patient reported motivation  to be compliant with the aftercare plan.   DISCHARGE MEDICATIONS:  1. Lexapro 10 mg 1/2 q.a.m.  The patient is to stop.  2. Trileptal 300 mg q.a.m. and q. 3 p.m. and 2 q.h.s.  3. Neurontin 100 mg 2 four times a day.  4. Ativan 0.5 mg 1/2 to 1 twice a  day p.r.n.   DISPOSITION:  Husband was to secure medications per discharge physician and  the patient was to follow up with Dr. Kathrynn Running on February 12 at 2:45 and  [facility] on February 13, at 9:30.  The patient was also to start parenting  classes with family services, to start in April.   DISCHARGE DIAGNOSES:   AXIS I:  Rule out bipolar disorder II,  mixed.   AXIS II:  None.   AXIS III:  Asthma and tobacco abuse.   AXIS IV:  Moderate, problems related to support system.   AXIS V:  Global assessment of function at discharge was 50-55.                                                 Jeanice Lim, M.D.    JEM/MEDQ  D:  01/18/2003  T:  01/18/2003  Job:  [id number]

## 2011-04-05 NOTE — Discharge Summary (Signed)
NAMEJOSHALYN, Brandi Morris              ACCOUNT NO.:  [id number]   MEDICAL RECORD NO.:  [id number]          PATIENT TYPE:  OBV   LOCATION:  9316                          FACILITY:  [facility]   PHYSICIAN:  Carrington Clamp, M.D. DATE OF BIRTH:  Jan 16, 1976   DATE OF ADMISSION:  06/08/2005  DATE OF DISCHARGE:  06/09/2005                                 DISCHARGE SUMMARY   FINAL DIAGNOSES:  1.  Intrauterine pregnancy at 11 and one-seventh weeks gestation.  2.  Probable gastrointestinal illness.   COMPLICATIONS:  None.   This 35 year old G5 P2-0-2-2 presents at 72 and one-seventh weeks gestation  complaining of 3 days of nausea, vomiting and diarrhea. The patient had a  fever up to about 101. She was unable to really eat, but was keeping down  water. She did have some watery diarrhea but no blood and no mucus in stool  Nobody else in her family had been sick. She was admitted at this time, was  started on some Zofran for the nausea and vomiting. Stool cultures were  obtained as well as labs and the patient was started on some IV fluids and  antiemetics. By hospital day #1 the patient was feeling slightly better. She  was having some more semi-formed stool and her stool cultures were still  pending at this time. She was felt ready for discharge. She was sent home on  bland diet with no dairy, to use Phenergan as needed for her nausea, was to  call on Monday for an appointment to be seen that week, and of course to  call if any of these problems returned.   LABORATORY ON DISCHARGE:  The patient had a hemoglobin of 13.1, white blood  cell count of 13.1 Normal liver function tests and normal renal function.  Stool cultures all came back negative.      Leilani Able, P.A.-C.      Carrington Clamp, M.D.  Electronically Signed    MB/MEDQ  D:  07/04/2005  T:  07/04/2005  Job:  [id number]

## 2011-04-05 NOTE — Op Note (Signed)
NAMESHALEE, PAOLO              ACCOUNT NO.:  [id number]   MEDICAL RECORD NO.:  [id number]          PATIENT TYPE:  AMB   LOCATION:  [facility]                         FACILITY:  [facility]   PHYSICIAN:  Sigmund I. Patsi Sears, M.D.DATE OF BIRTH:  07/17/1976   DATE OF PROCEDURE:  03/24/2006  DATE OF DISCHARGE:                                 OPERATIVE REPORT   PREOP DIAGNOSIS:  Right lower ureteral ureterovesical junction stone.   POSTOP DIAGNOSIS:  Right lower ureteral ureterovesical junction stone.   OPERATION:  Cystourethroscopy, bilateral retrograde pyelogram, bilateral  ureteroscopy.   SURGEON:  Sigmund I. Patsi Sears, M.D.   ANESTHESIA:  General LMA.   PREPARATION:  After appropriate preanesthesia, the patient is brought to the  operating, placed on the operating room dorsal supine position where general  LMA anesthesia was induced.  She was then replaced in dorsal lithotomy  position and pubis was prepped with Betadine solution and draped usual  fashion.   REVIEW OF HISTORY:  This 35 year old female was referred by [facility]  emergency room for a 5 mm right ureterovesical junction stone, with  hydronephrosis, and right ureteral colic.  The patient had not passed her  stone when seen on Friday in the office, and was scheduled for surgery on  Monday, if she had not passed  her stone.  She was treated with Flomax, and  IM Toradol on Friday.  The patient reports that she did not pass her stone  over the weekend, and has continued to have right flank pain.  Therefore,  she is for basket extraction today.   PROCEDURE:  Cystourethroscopy accomplished after B and O suppository was  given.  The patient was marked prior to surgery on her right side.  She had  previously had a kidney stone on the left side with basket extraction.   Right retrograde pyelogram was essentially negative, but did show a dilated  right ureter.  Right ureteroscopy was accomplished with a short ureteroscope  to  the level of UPJ, no stone could be identified.  Therefore, the long  ureteroscope was passed, and again, no stone identified.  The retrograde  pyelogram showed a dilated ureter on the right side, but no evidence of  abnormality of the renal pelvis or kidney.  Endoscopy of the renal pelvis  revealed no stone as well.  The calyces that were identified, including the  upper pole and the posterior pole calices were also negative.  Considered  placing a double-J catheter, but because there was no stone, because the  ureter was nicely dilated all the way to the opening, but I elected to not  leave a double-J in this patient.   Left retrograde pyelogram, ureteroscopy were accomplished and showed normal  caliber ureter, with no evidence of stone disease.   The bladder was searched carefully, and showed normal bladder wall with  normal mucosa.  There is no evidence of bladder stone, tumor, or  diverticular formation.  The bladder was drained of fluid.  The patient was  given IV Toradol, awakened and taken to recovery room in good condition.  Sigmund I. Patsi Sears, M.D.  Electronically Signed     SIT/MEDQ  D:  03/24/2006  T:  03/25/2006  Job:  [id number]

## 2011-04-23 ENCOUNTER — Emergency Department (HOSPITAL_COMMUNITY)
Admission: EM | Admit: 2011-04-23 | Discharge: 2011-04-24 | Disposition: A | Discharge status: Home / Self Care | Payer: Medicaid Other | Attending: Emergency Medicine | Admitting: Emergency Medicine

## 2011-04-23 ENCOUNTER — Emergency Department (HOSPITAL_COMMUNITY): Payer: Medicaid Other

## 2011-04-23 DIAGNOSIS — Z87442 Personal history of urinary calculi: Secondary | ICD-10-CM | POA: Insufficient documentation

## 2011-04-23 DIAGNOSIS — N200 Calculus of kidney: Secondary | ICD-10-CM | POA: Insufficient documentation

## 2011-04-23 DIAGNOSIS — R109 Unspecified abdominal pain: Secondary | ICD-10-CM | POA: Insufficient documentation

## 2011-04-23 LAB — URINALYSIS, ROUTINE W REFLEX MICROSCOPIC
Glucose, UA: NEGATIVE mg/dL
Ketones, ur: NEGATIVE mg/dL
Nitrite: NEGATIVE
Specific Gravity, Urine: 1.013 (ref 1.005–1.030)
pH: 6.5 (ref 5.0–8.0)

## 2011-04-23 LAB — URINE MICROSCOPIC-ADD ON

## 2011-07-16 ENCOUNTER — Emergency Department (HOSPITAL_COMMUNITY)
Admission: EM | Admit: 2011-07-16 | Discharge: 2011-07-16 | Disposition: A | Discharge status: Home / Self Care | Payer: Medicaid Other | Attending: Emergency Medicine | Admitting: Emergency Medicine

## 2011-07-16 DIAGNOSIS — K029 Dental caries, unspecified: Secondary | ICD-10-CM | POA: Insufficient documentation

## 2011-07-16 DIAGNOSIS — R300 Dysuria: Secondary | ICD-10-CM | POA: Insufficient documentation

## 2011-07-16 DIAGNOSIS — N390 Urinary tract infection, site not specified: Secondary | ICD-10-CM | POA: Insufficient documentation

## 2011-07-16 LAB — URINE MICROSCOPIC-ADD ON

## 2011-07-16 LAB — CBC
HCT: 41.9 % (ref 36.0–46.0)
Hemoglobin: 14.8 g/dL (ref 12.0–15.0)
RBC: 4.89 MIL/uL (ref 3.87–5.11)

## 2011-07-16 LAB — COMPREHENSIVE METABOLIC PANEL
ALT: 15 U/L (ref 0–35)
Alkaline Phosphatase: 52 U/L (ref 39–117)
CO2: 23 mEq/L (ref 19–32)
GFR calc Af Amer: >60 mL/min (ref >60)
GFR calc non Af Amer: >60 mL/min (ref >60)
Glucose, Bld: 98 mg/dL (ref 70–99)
Potassium: 3.9 mEq/L (ref 3.5–5.1)
Sodium: 136 mEq/L (ref 135–145)
Total Bilirubin: 0.5 mg/dL (ref 0.3–1.2)

## 2011-07-16 LAB — URINALYSIS, ROUTINE W REFLEX MICROSCOPIC
Bilirubin Urine: NEGATIVE
Glucose, UA: NEGATIVE mg/dL
Ketones, ur: NEGATIVE mg/dL
Nitrite: NEGATIVE
Protein, ur: NEGATIVE mg/dL
pH: 6 (ref 5.0–8.0)

## 2011-07-24 ENCOUNTER — Emergency Department (HOSPITAL_COMMUNITY)
Admission: EM | Admit: 2011-07-24 | Discharge: 2011-07-24 | Disposition: A | Discharge status: Home / Self Care | Payer: Medicaid Other | Attending: Emergency Medicine | Admitting: Emergency Medicine

## 2011-07-24 DIAGNOSIS — G43909 Migraine, unspecified, not intractable, without status migrainosus: Secondary | ICD-10-CM | POA: Insufficient documentation

## 2011-08-09 LAB — PREGNANCY, URINE: Preg Test, Ur: NEGATIVE

## 2011-08-09 LAB — URINALYSIS, ROUTINE W REFLEX MICROSCOPIC
Glucose, UA: NEGATIVE
Hgb urine dipstick: NEGATIVE
Ketones, ur: NEGATIVE
Leukocytes, UA: NEGATIVE
Nitrite: NEGATIVE
Protein, ur: NEGATIVE
Specific Gravity, Urine: 1.026
pH: 6.5

## 2011-08-09 LAB — URINE MICROSCOPIC-ADD ON

## 2011-08-16 LAB — POCT I-STAT, CHEM 8
Chloride: 102
HCT: 47 — ABNORMAL HIGH
Potassium: 3.9

## 2011-08-16 LAB — URINALYSIS, ROUTINE W REFLEX MICROSCOPIC
Glucose, UA: NEGATIVE
Protein, ur: NEGATIVE
Urobilinogen, UA: 0.2

## 2011-08-16 LAB — HEPATIC FUNCTION PANEL
ALT: 17
AST: 22
Indirect Bilirubin: 0.7
Total Protein: 6.6

## 2011-08-16 LAB — CBC
MCHC: 35
Platelets: 257
RDW: 12.6

## 2011-08-16 LAB — DIFFERENTIAL
Basophils Absolute: 0.1
Basophils Relative: 1
Neutro Abs: 3.9
Neutrophils Relative %: 40 — ABNORMAL LOW

## 2011-08-16 LAB — URINE MICROSCOPIC-ADD ON

## 2011-08-19 LAB — POCT I-STAT, CHEM 8
Calcium, Ion: 1.12
Chloride: 103
Glucose, Bld: 94
HCT: 41
Hemoglobin: 13.9
TCO2: 27

## 2011-08-19 LAB — URINE MICROSCOPIC-ADD ON

## 2011-08-19 LAB — URINALYSIS, ROUTINE W REFLEX MICROSCOPIC
Bilirubin Urine: NEGATIVE
Hgb urine dipstick: NEGATIVE
Nitrite: NEGATIVE
Specific Gravity, Urine: 1.016
pH: 6

## 2011-08-19 LAB — DIFFERENTIAL
Basophils Absolute: 0.1
Basophils Relative: 1
Lymphocytes Relative: 33
Monocytes Absolute: 0.6
Neutro Abs: 5.4
Neutrophils Relative %: 47

## 2011-08-19 LAB — WET PREP, GENITAL: Trich, Wet Prep: NONE SEEN

## 2011-08-19 LAB — CBC
Hemoglobin: 14.1
MCHC: 33.9
RBC: 4.61
RDW: 12.5

## 2011-08-19 LAB — GC/CHLAMYDIA PROBE AMP, GENITAL: GC Probe Amp, Genital: NEGATIVE

## 2011-12-22 ENCOUNTER — Emergency Department (HOSPITAL_COMMUNITY): Payer: Medicaid Other

## 2011-12-22 ENCOUNTER — Encounter (HOSPITAL_COMMUNITY): Payer: Self-pay | Admitting: Emergency Medicine

## 2011-12-22 ENCOUNTER — Emergency Department (HOSPITAL_COMMUNITY)
Admission: EM | Admit: 2011-12-22 | Discharge: 2011-12-22 | Disposition: A | Discharge status: Home / Self Care | Payer: Medicaid Other | Attending: Emergency Medicine | Admitting: Emergency Medicine

## 2011-12-22 DIAGNOSIS — R209 Unspecified disturbances of skin sensation: Secondary | ICD-10-CM | POA: Insufficient documentation

## 2011-12-22 DIAGNOSIS — M25529 Pain in unspecified elbow: Secondary | ICD-10-CM | POA: Insufficient documentation

## 2011-12-22 DIAGNOSIS — IMO0002 Reserved for concepts with insufficient information to code with codable children: Secondary | ICD-10-CM | POA: Insufficient documentation

## 2011-12-22 DIAGNOSIS — M25521 Pain in right elbow: Secondary | ICD-10-CM

## 2011-12-22 DIAGNOSIS — Y92009 Unspecified place in unspecified non-institutional (private) residence as the place of occurrence of the external cause: Secondary | ICD-10-CM | POA: Insufficient documentation

## 2011-12-22 HISTORY — DX: Interstitial cystitis (chronic) without hematuria: N30.10

## 2011-12-22 HISTORY — DX: Calculus of kidney: N20.0

## 2011-12-22 MED ORDER — HYDROCODONE-ACETAMINOPHEN 5-325 MG PO TABS: ORAL_TABLET | ORAL | Status: AC

## 2011-12-22 NOTE — ED Notes (Signed)
Pt states while assisting husband seizure in on ED stretcher 2 weeks ago pt became tangled in metal rail. Pt experiencing severe pain to R elbow. Pt states pain now unbearable. Limited ROM d/t pain. Slight swelling noted to elbow. +pulses, good cap refill.

## 2011-12-22 NOTE — ED Notes (Signed)
Pt c/o R elbow pain after injury while assisting husband during seizure 12/08/11

## 2011-12-22 NOTE — ED Provider Notes (Signed)
History     CSN: [id number]  Arrival date & time 12/22/11  2005   None     Chief Complaint  Patient presents with  . Elbow Injury    (Consider location/radiation/quality/duration/timing/severity/associated sxs/prior treatment) HPI Comments: Patient presents with right elbow pain from an injury that she sustained 2 weeks ago while straining her husband was having a seizure. She had a direct blow the elbow against the bed. Since that time she has pain but is able to move her elbow through full range of motion. She's been treating at home with ibuprofen and ice. Patient states that tonight the pain was worse and that she just can't stand it anymore. Denies pain in shoulder, wrist or fingers. Patient states that her elbow was swollen at first however swelling has resolved.  Patient is a 36 y.o. female presenting with arm injury. The history is provided by the patient.  Arm Injury  The incident occurred more than 2 days ago. The injury mechanism was a direct blow. The pain is moderate. Pertinent negatives include no numbness, no neck pain and no weakness.    History reviewed. No pertinent past medical history.  No past surgical history on file.  No family history on file.  History  Substance Use Topics  . Smoking status: Not on file  . Smokeless tobacco: Not on file  . Alcohol Use: Not on file    OB History    Grav Para Term Preterm Abortions TAB SAB Ect Mult Living                  Review of Systems  Constitutional: Negative for activity change.  HENT: Negative for neck pain.   Musculoskeletal: Positive for arthralgias. Negative for back pain and joint swelling.  Skin: Negative for wound.  Neurological: Negative for weakness and numbness.    Allergies  Entex and Etodolac  Home Medications  No current outpatient prescriptions on file.  BP 123/84  Pulse 94  Temp(Src) 98.7 F (37.1 C) (Oral)  Resp 16  SpO2 100%  Physical Exam  Nursing note and vitals  reviewed. Constitutional: She is oriented to person, place, and time. She appears well-developed and well-nourished.  HENT:  Head: Normocephalic and atraumatic.  Eyes: Pupils are equal, round, and reactive to light.  Neck: Normal range of motion. Neck supple.  Cardiovascular: Exam reveals no decreased pulses.   Pulses:      Radial pulses are 2+ on the right side, and 2+ on the left side.  Musculoskeletal: She exhibits tenderness. She exhibits no edema.       Right elbow: She exhibits normal range of motion, no swelling, no effusion and no deformity. tenderness found. Medial epicondyle and lateral epicondyle tenderness noted. No radial head and no olecranon process tenderness noted.  Neurological: She is alert and oriented to person, place, and time. A sensory deficit is present.       Motor and vascular distal to the injury is fully intact. Patient states she has some tingling at times over the radial aspect of her right forearm.  Skin: Skin is warm and dry.  Psychiatric: She has a normal mood and affect.    ED Course  Procedures (including critical care time)  Labs Reviewed - No data to display Dg Elbow Complete Right  12/22/2011  *RADIOLOGY REPORT*  Clinical Data: Larey Seat  RIGHT ELBOW - COMPLETE 3+ VIEW  Comparison: None.  Findings: No effusion. Negative for fracture, dislocation, or other acute abnormality.  Normal alignment and  mineralization. No significant degenerative change.  Regional soft tissues unremarkable.  IMPRESSION:  Negative  Original Report Authenticated By: Thora Lance III, M.D.     1. Right elbow pain     8:22 PM Patient seen and examined. X-rays ordered. Patient refuses pain medication here as she is driving home.  Vital signs reviewed and are as follows: Filed Vitals:   12/22/11 2013  BP: 123/84  Pulse: 94  Temp: 98.7 F (37.1 C)  Resp: 16   Patient was informed of x-ray results. Patient was counseled on RICE protocol and told to rest injury, use ice  for no longer than 15 minutes every hour, compress the area, and elevate above the level of their heart as much as possible to reduce swelling.  Questions answered.  Urged to f/u with her orthopedist.  Patient verbalized understanding.   Patient counseled on use of narcotic pain medications. Counseled not to combine these medications with others containing tylenol. Urged not to drink alcohol, drive, or perform any other activities that requires focus while taking these medications. The patient verbalizes understanding and agrees with the plan.    MDM  Elbow pain s/p injury. Neg x-ray. Recommend ortho f/u given duration of symptoms. She has orthopedist.         Carolee Rota, PA 12/22/11 2205

## 2011-12-23 NOTE — ED Provider Notes (Signed)
Medical screening examination/treatment/procedure(s) were performed by non-physician practitioner and as supervising physician I was immediately available for consultation/collaboration.   Zamariya Neal, MD 12/23/11 1706 

## 2012-02-11 ENCOUNTER — Encounter (HOSPITAL_COMMUNITY): Payer: Self-pay | Admitting: Emergency Medicine

## 2012-02-11 ENCOUNTER — Emergency Department (HOSPITAL_COMMUNITY)
Admission: EM | Admit: 2012-02-11 | Discharge: 2012-02-12 | Disposition: A | Discharge status: Home / Self Care | Payer: Medicaid Other | Attending: Emergency Medicine | Admitting: Emergency Medicine

## 2012-02-11 DIAGNOSIS — N3010 Interstitial cystitis (chronic) without hematuria: Secondary | ICD-10-CM | POA: Insufficient documentation

## 2012-02-11 DIAGNOSIS — L299 Pruritus, unspecified: Secondary | ICD-10-CM | POA: Insufficient documentation

## 2012-02-11 DIAGNOSIS — J3489 Other specified disorders of nose and nasal sinuses: Secondary | ICD-10-CM | POA: Insufficient documentation

## 2012-02-11 DIAGNOSIS — Y9229 Other specified public building as the place of occurrence of the external cause: Secondary | ICD-10-CM | POA: Insufficient documentation

## 2012-02-11 DIAGNOSIS — R059 Cough, unspecified: Secondary | ICD-10-CM | POA: Insufficient documentation

## 2012-02-11 DIAGNOSIS — I10 Essential (primary) hypertension: Secondary | ICD-10-CM | POA: Insufficient documentation

## 2012-02-11 DIAGNOSIS — T7840XA Allergy, unspecified, initial encounter: Secondary | ICD-10-CM | POA: Insufficient documentation

## 2012-02-11 DIAGNOSIS — F17200 Nicotine dependence, unspecified, uncomplicated: Secondary | ICD-10-CM | POA: Insufficient documentation

## 2012-02-11 DIAGNOSIS — R05 Cough: Secondary | ICD-10-CM | POA: Insufficient documentation

## 2012-02-11 DIAGNOSIS — R0602 Shortness of breath: Secondary | ICD-10-CM | POA: Insufficient documentation

## 2012-02-11 HISTORY — DX: Allergy status to unspecified drugs, medicaments and biological substances: Z88.9

## 2012-02-11 MED ORDER — PREDNISONE 20 MG PO TABS
60.0000 mg | ORAL_TABLET | Freq: Once | ORAL | Status: AC
  Administered 2012-02-11: 60 mg via ORAL
  Filled 2012-02-11: qty 3

## 2012-02-11 NOTE — ED Notes (Signed)
PT. REPORTS LAVENDER SCENT WHILE AT A FUNERAL HOME - STATES ALLERGIC TO LAVENDER , PRESENTS WITH THROAT ITCHING WITH PRODUCTIVE COUGH / LIPS TINGLING , AIRWAY INTACY /RESPIRATIONS UNLABORED, RECEIVED EPI PEN PRIOR TO ARRIVAL.

## 2012-02-11 NOTE — ED Notes (Signed)
Patient states that she was around a lot of lavender flowers and has allergy to same. Pt states she had onset of watering eyes, SOB and wheezing.  Pt used her epi pen for same.  Pt also used someone's albuterol inhaler.  PT states that SOB has improved, watering eyes have improved.  Pt states that she cont. To have a lump in her throat.  Pt is able to speak complete sentences. No distress noted.  No hives noted.

## 2012-02-11 NOTE — ED Provider Notes (Signed)
History     CSN: [id number]  Arrival date & time 02/11/12  2041   First MD Initiated Contact with Patient 02/11/12 2302      Chief Complaint  Patient presents with  . Allergic Reaction     HPI  History provided by the patient. 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 Patient reports that she was attending a funeral at which she had noticed some flower arrangements containing lavender. Patient also reports having a woman nearby with a lavender body lotion they gave a strong odor. During this process she began to feel scratching around her mouth and throat with increased rhinorrhea and congestion. Patient reports having several episodes of coughing lasting approximately 30 minutes. Patient came concern for a severe allergic reaction and use her EpiPen around 8 PM. Since that time she has had gradually improvement of symptoms. Currently patient reports some itching to the eyes. Patient no longer feels any shortness of breath or difficulty breathing.     Past Medical History  Diagnosis Date  . Migraine   . Kidney stone   . Interstitial cystitis   . Multiple allergies     Past Surgical History  Procedure Date  . Rhinoplasty   . Bladder augmentation   . Tubal ligation   . Knee surgery     No family history on file.  History  Substance Use Topics  . Smoking status: Current Everyday Smoker  . Smokeless tobacco: Not on file  . Alcohol Use: No    OB History    Grav Para Term Preterm Abortions TAB SAB Ect Mult Living   5 3              Review of Systems  Constitutional: Negative for fever.  HENT: Positive for rhinorrhea. Negative for sore throat, drooling and trouble swallowing.   Eyes: Positive for itching.  Respiratory: Positive for cough and shortness of breath.   Gastrointestinal: Negative for nausea and vomiting.    Allergies    Amitriptyline; Entex; and Etodolac  Home Medications   Current Outpatient Rx  Name Route Sig Dispense Refill  . ALPRAZOLAM 1 MG PO TABS Oral Take 1 mg by mouth 2 (two) times daily as needed. For anxiety    . BUPROPION HCL ER (XL) 300 MG PO TB24 Oral Take 300 mg by mouth daily.    Marland Kitchen EPIPEN IJ Injection Inject 1 application as directed daily as needed. For severe allergic reaction    . IBUPROFEN 200 MG PO TABS Oral Take 800 mg by mouth daily.     Marland Kitchen LORATADINE 10 MG PO TABS Oral Take 10 mg by mouth daily.    Marland Kitchen PRAVASTATIN SODIUM 40 MG PO TABS Oral Take 40 mg by mouth daily.    . TOPIRAMATE 50 MG PO TABS Oral Take 50 mg by mouth 2 (two) times daily.    . TRAMADOL HCL 50 MG PO TABS Oral Take 50 mg by mouth every 6 (six) hours as needed. For pain      BP 128/85  Pulse 98  Resp 16  SpO2 96%  Physical Exam  Nursing note and vitals reviewed. Constitutional: She is oriented to person, place, and time. She appears well-developed and well-nourished. No distress.  HENT:  Head: Normocephalic and atraumatic.  Mouth/Throat: Oropharynx is clear and moist.       Lips, tongue and oropharynx normal without signs  of edema.  Neck: Normal range of motion. Neck supple.       No stridor. No swelling of the neck.  Cardiovascular: Normal rate and regular rhythm.   Pulmonary/Chest: Effort normal and breath sounds normal. No stridor. No respiratory distress. She has no wheezes. She has no rales.  Musculoskeletal: She exhibits no edema and no tenderness.  Neurological: She is alert and oriented to person, place, and time.  Skin: Skin is warm and dry. No rash noted.  Psychiatric: She has a normal mood and affect. Her behavior is normal.    ED Course  Procedures      1. Allergic reaction       MDM  11:08 PM patient seen and evaluated. Patient no acute distress. Patient with normal respirations and normal speech.        Angus Seller, [place] 02/13/12 [id number]

## 2012-02-12 MED ORDER — EPINEPHRINE 0.3 MG/0.3ML IJ DEVI: 0.3000 mg | INTRAMUSCULAR | Status: DC | PRN

## 2012-02-12 NOTE — Discharge Instructions (Signed)
Please followup with your primary care provider for continued evaluation and treatment of your allergy symptoms. Return to emergency room if you have any swelling of the throat, shortness of breath or concerns for allergic reaction.   Allergic Reaction, Mild to Moderate Allergies may happen from anything your body is sensitive to. This may be food, medications, pollens, chemicals, and nearly anything around you in everyday life that produces allergens. An allergen is anything that causes an allergy producing substance. Allergens cause your body to release allergic antibodies. Through a chain of events, they cause a release of histamine into the blood stream. Histamines are meant to protect you, but they also cause your discomfort. This is why antihistamines are often used for allergies. Heredity is often a factor in causing allergic reactions. This means you may have some of the same allergies as your parents. Allergies happen in all age groups. You may have some idea of what caused your reaction. There are many allergens around [place]. It may be difficult to know what caused your reaction. If this is a first time event, it may never happen again. Allergies cannot be cured but can be controlled with medications. SYMPTOMS  You may get some or all of the following problems from allergies.  Swelling and itching in and around the mouth.   Tearing, itchy eyes.   Nasal congestion and runny nose.   Sneezing and coughing.   An itchy red rash or hives.   Vomiting or diarrhea.   Difficulty breathing.  Seasonal allergies occur in all age groups. They are seasonal because they usually occur during the same season every year. They may be a reaction to molds, grass pollens, or tree pollens. Other causes of allergies are house dust mite allergens, pet dander and mold spores. These are just a common few of the thousands of allergens around [place]. All of the symptoms listed above happen when you come in contact with  pollens and other allergens. Seasonal allergies are usually not life threatening. They are generally more of a nuisance that can often be handled using medications. Hay fever is a combination of all or some of the above listed allergy problems. It may often be treated with simple over-the-counter medications such as diphenhydramine. Take medication as directed. Check with your caregiver or package insert for child dosages. TREATMENT AND HOME CARE INSTRUCTIONS If hives or rash are present:  Take medications as directed.   You may use an over-the-counter antihistamine (diphenhydramine) for hives and itching as needed. Do not drive or drink alcohol until medications used to treat the reaction have worn off. Antihistamines tend to make people sleepy.   Apply cold cloths (compresses) to the skin or take baths in cool water. This will help itching. Avoid hot baths or showers. Heat will make a rash and itching worse.   If your allergies persist and become more severe, and over the counter medications are not effective, there are many new medications your caretaker can prescribe. Immunotherapy or desensitizing injections can be used if all else fails. Follow up with your caregiver if problems continue.  SEEK MEDICAL CARE IF:   Your allergies are becoming progressively more troublesome.   You suspect a food allergy. Symptoms generally happen within 30 minutes of eating a food.   Your symptoms have not gone away within 2 days or are getting worse.   You develop new symptoms.   You want to retest yourself or your child with a food or drink you think causes an  allergic reaction. Never test yourself or your child of a suspected allergy without being under the watchful eye of your caregivers. A second exposure to an allergen may be life-threatening.  SEEK IMMEDIATE MEDICAL CARE IF:  You develop difficulty breathing or wheezing, or have a tight feeling in your chest or throat.   You develop a swollen  mouth, hives, swelling, or itching all over your body.  A severe reaction with any of the above problems should be considered life-threatening. If you suddenly develop difficulty breathing call for local emergency medical help. THIS IS AN EMERGENCY. MAKE SURE YOU:   Understand these instructions.   Will watch your condition.   Will get help right away if you are not doing well or get worse.  Document Released: 09/01/2007 Document Revised: 10/24/2011 Document Reviewed: 09/01/2007 [facility] Patient Information 2012 [place], [place].   RESOURCE GUIDE  Dental Problems  Patients with Medicaid: [facility] [phone] [address]                                           [phone] [address] Phone:  [phone]                                                  Phone:  [phone]  If unable to pay or uninsured, contact:  [facility] or [facility] [employer] to become qualified for the adult dental clinic.  Chronic Pain Problems Contact [facility]  [phone] Patients need to be referred by their primary care doctor.  Insufficient Money for Medicine Contact [employer]:  call "211" or [facility] 408-246-8661.  No Primary Care Doctor Call [facility]  [phone] Other agencies that provide inexpensive medical care    [facility]  [phone]    [facility]  [phone]    [facility]  [phone]    [facility]  [phone]    [facility]  [phone]    [facility]  [phone]  Psychological Services [facility]  [phone] [facility]  4036705995 [facility]   [phone] (emergency services ([phone])  Substance Abuse Resources [facility]  (938)681-3996 [facility] 610-154-1081 [facility] [phone] Floydene Flock 9190531837 Residential & Outpatient Substance Abuse Program   (312)598-3802  Abuse/Neglect [facility] Child Abuse Hotline 5101288487 [facility] Child Abuse Hotline (203)051-3043 (After Hours)  Emergency Shelter [facility] 250-606-5389  Maternity Homes [facility] at the [place] [facility] (210)860-5773 [facility] 302-285-0304  MRSA Hotline #:   909-089-1191    [facility]  [facility]     [employer]                          [facility] [address] [place]                       [address]      [address] [place]  [address]                                                Cristobal Goldmann Phone:  [phone]                                   Phone:  [phone]                 Phone:  [phone]  [facility] Phone:  [phone]  [facility] Child Abuse Hotline (551)184-8400 559-299-4631 (After Hours)

## 2012-02-13 NOTE — ED Provider Notes (Signed)
Medical screening examination/treatment/procedure(s) were performed by non-physician practitioner and as supervising physician I was immediately available for consultation/collaboration.   Aimi Essner Y. Caeli Linehan, MD 02/13/12 0644 

## 2012-05-25 ENCOUNTER — Emergency Department (HOSPITAL_COMMUNITY)
Admission: EM | Admit: 2012-05-25 | Discharge: 2012-05-25 | Disposition: A | Discharge status: Home / Self Care | Payer: Self-pay | Attending: Emergency Medicine | Admitting: Emergency Medicine

## 2012-05-25 ENCOUNTER — Encounter (HOSPITAL_COMMUNITY): Payer: Self-pay | Admitting: *Deleted

## 2012-05-25 DIAGNOSIS — X58XXXA Exposure to other specified factors, initial encounter: Secondary | ICD-10-CM | POA: Insufficient documentation

## 2012-05-25 DIAGNOSIS — F17200 Nicotine dependence, unspecified, uncomplicated: Secondary | ICD-10-CM | POA: Insufficient documentation

## 2012-05-25 DIAGNOSIS — S335XXA Sprain of ligaments of lumbar spine, initial encounter: Secondary | ICD-10-CM | POA: Insufficient documentation

## 2012-05-25 DIAGNOSIS — S39012A Strain of muscle, fascia and tendon of lower back, initial encounter: Secondary | ICD-10-CM

## 2012-05-25 DIAGNOSIS — M545 Low back pain, unspecified: Secondary | ICD-10-CM | POA: Insufficient documentation

## 2012-05-25 DIAGNOSIS — Z87442 Personal history of urinary calculi: Secondary | ICD-10-CM | POA: Insufficient documentation

## 2012-05-25 MED ORDER — OXYCODONE-ACETAMINOPHEN 5-325 MG PO TABS: 1.0000 | ORAL_TABLET | Freq: Four times a day (QID) | ORAL | Status: AC | PRN

## 2012-05-25 MED ORDER — OXYCODONE-ACETAMINOPHEN 5-325 MG PO TABS
1.0000 | ORAL_TABLET | Freq: Once | ORAL | Status: AC
  Administered 2012-05-25: 1 via ORAL
  Filled 2012-05-25: qty 1

## 2012-05-25 MED ORDER — IBUPROFEN 800 MG PO TABS: 800.0000 mg | ORAL_TABLET | Freq: Three times a day (TID) | ORAL | Status: AC | PRN

## 2012-05-25 NOTE — ED Notes (Signed)
Pt reports lower back pain that began approx 7hrs ago - states she is packing to move and has been lifting heavy items. Pt reports she was bending over to lift something when she heard a grinding noise w/ acute onset of back pain. Pt w/ grimacing on assessment.

## 2012-05-25 NOTE — ED Provider Notes (Signed)
History     CSN: [id number]  Arrival date & time 05/25/12  4098   First MD Initiated Contact with Patient 05/25/12 2017      Chief Complaint  Patient presents with  . Back Pain    (Consider location/radiation/quality/duration/timing/severity/associated sxs/prior treatment) HPI Comments: Patient presents with left lower back pain that started acutely after bending over while packing today. Patient states that the pain radiates into her left leg. She's been taking ibuprofen without relief. She denies red flag signs and symptoms of lower back pain. She is not a history of lower back problems. She denies weakness, numbness, or tingling in her leg. She did not fall. Onset was acute. Course is constant. Nothing makes the symptoms better. Certain positions and movement makes the pain worse.  Patient is a 36 y.o. female presenting with back pain. The history is provided by the patient.  Back Pain  This is a new problem. The current episode started 3 to 5 hours ago. The problem occurs constantly. The problem has not changed since onset.The pain is associated with lifting heavy objects. The pain is present in the lumbar spine. The quality of the pain is described as shooting. The pain radiates to the left thigh. The pain is moderate. The symptoms are aggravated by bending, twisting and certain positions. Pertinent negatives include no fever, no numbness, no weight loss, no abdominal swelling, no bowel incontinence, no perianal numbness, no bladder incontinence, no dysuria, no pelvic pain, no tingling and no weakness. She has tried NSAIDs for the symptoms. The treatment provided no relief.    Past Medical History  Diagnosis Date  . Migraine   . Kidney stone   . Interstitial cystitis   . Multiple allergies     Past Surgical History  Procedure Date  . Rhinoplasty   . Bladder augmentation   . Tubal ligation   . Knee surgery     No family history on file.  History  Substance Use Topics  .  Smoking status: Current Everyday Smoker  . Smokeless tobacco: Not on file  . Alcohol Use: No    OB History    Grav Para Term Preterm Abortions TAB SAB Ect Mult Living   5 3              Review of Systems  Constitutional: Negative for fever, weight loss and unexpected weight change.  Gastrointestinal: Negative for constipation and bowel incontinence.       Negative for fecal incontinence.   Genitourinary: Negative for bladder incontinence, dysuria, hematuria, flank pain, vaginal bleeding, vaginal discharge and pelvic pain.       Negative for urinary incontinence or retention.  Musculoskeletal: Positive for back pain.  Neurological: Negative for tingling, weakness and numbness.       Denies saddle paresthesias.    Allergies  Amitriptyline; Entex; and Etodolac  Home Medications  No current outpatient prescriptions on file.  BP 115/75  Pulse 110  Temp 97.9 F (36.6 C) (Oral)  Resp 20  SpO2 100%  Physical Exam  Nursing note and vitals reviewed. Constitutional: She appears well-developed and well-nourished.  HENT:  Head: Normocephalic and atraumatic.  Eyes: Conjunctivae are normal.  Neck: Normal range of motion. Neck supple.  Pulmonary/Chest: Effort normal.  Abdominal: Soft. There is no tenderness. There is no CVA tenderness.  Musculoskeletal: Normal range of motion.       There is no tenderness to palpation over cervical/thoracic/lumbar/sacral spine. Tenderness to palpation over lumbar paraspinal muscles. No step-off noted with  palpation of spine.   Neurological: She is alert. She has normal strength and normal reflexes. No sensory deficit.       5/5 strength in entire lower extremities bilaterally. No sensation deficit.   Skin: Skin is warm and dry. No rash noted.  Psychiatric: She has a normal mood and affect.    ED Course  Procedures (including critical care time)  Labs Reviewed - No data to display No results found.   1. Lumbosacral strain     8:47 PM  Patient seen and examined. Medications ordered.   Vital signs reviewed and are as follows: Filed Vitals:   05/25/12 1948  BP: 115/75  Pulse: 110  Temp: 97.9 F (36.6 C)  Resp: 20   No red flag s/s of low back pain. Patient was counseled on back pain precautions and told to do activity as tolerated but do not lift, push, or pull heavy objects more than 10 pounds for the next week.  Patient counseled to use ice or heat on back for no longer than 15 minutes every hour.   Patient prescribed narcotic pain medicine and counseled on proper use of narcotic pain medications. Counseled not to combine this medication with others containing tylenol.   Urged patient not to drink alcohol, drive, or perform any other activities that requires focus while taking either of these medications.  Patient urged to follow-up with PCP if pain does not improve with treatment and rest or if pain becomes recurrent. Urged to return with worsening severe pain, loss of bowel or bladder control, trouble walking.   The patient verbalizes understanding and agrees with the plan.   MDM  Patient with back pain. No neurological deficits. Patient is ambulatory. No warning symptoms of back pain including: loss of bowel or bladder control, night sweats, waking from sleep with back pain, unexplained fevers or weight loss, h/o cancer, IVDU, recent trauma. No concern for cauda equina, epidural abscess, or other serious cause of back pain. Conservative measures such as rest, ice/heat and pain medicine indicated with PCP follow-up if no improvement with conservative management.          Renne Crigler, [place] 05/28/12 1112

## 2012-05-25 NOTE — ED Notes (Signed)
Pt packing today; bent over and heard a noise like "grinding" and has had excruciating pain since that time

## 2012-05-25 NOTE — ED Notes (Signed)
Rx given x2 Pt ambulating independently w/ steady gait on d/c in no acute distress, A&Ox4. D/c instructions reviewed w/ pt and family - pt and family deny any further questions or concerns at present.  

## 2012-05-29 NOTE — ED Provider Notes (Signed)
Medical screening examination/treatment/procedure(s) were performed by non-physician practitioner and as supervising physician I was immediately available for consultation/collaboration.    Celene Kras, MD 05/29/12 1110

## 2013-12-28 ENCOUNTER — Emergency Department (HOSPITAL_COMMUNITY)
Admission: EM | Admit: 2013-12-28 | Discharge: 2013-12-28 | Disposition: A | Discharge status: Home / Self Care | Payer: Self-pay | Attending: Emergency Medicine | Admitting: Emergency Medicine

## 2013-12-28 ENCOUNTER — Emergency Department (HOSPITAL_COMMUNITY): Payer: Self-pay

## 2013-12-28 ENCOUNTER — Encounter (HOSPITAL_COMMUNITY): Payer: Self-pay | Admitting: Emergency Medicine

## 2013-12-28 DIAGNOSIS — Z87442 Personal history of urinary calculi: Secondary | ICD-10-CM | POA: Insufficient documentation

## 2013-12-28 DIAGNOSIS — F17200 Nicotine dependence, unspecified, uncomplicated: Secondary | ICD-10-CM | POA: Insufficient documentation

## 2013-12-28 DIAGNOSIS — Z8701 Personal history of pneumonia (recurrent): Secondary | ICD-10-CM | POA: Insufficient documentation

## 2013-12-28 DIAGNOSIS — Z87448 Personal history of other diseases of urinary system: Secondary | ICD-10-CM | POA: Insufficient documentation

## 2013-12-28 DIAGNOSIS — Z8679 Personal history of other diseases of the circulatory system: Secondary | ICD-10-CM | POA: Insufficient documentation

## 2013-12-28 DIAGNOSIS — J40 Bronchitis, not specified as acute or chronic: Secondary | ICD-10-CM | POA: Insufficient documentation

## 2013-12-28 LAB — CBC
HCT: 41.2 % (ref 36.0–46.0)
HEMOGLOBIN: 14.3 g/dL (ref 12.0–15.0)
MCH: 31 pg (ref 26.0–34.0)
MCHC: 34.7 g/dL (ref 30.0–36.0)
MCV: 89.2 fL (ref 78.0–100.0)
Platelets: 252 10*3/uL (ref 150–400)
RBC: 4.62 MIL/uL (ref 3.87–5.11)
RDW: 13.6 % (ref 11.5–15.5)
WBC: 12.7 10*3/uL — ABNORMAL HIGH (ref 4.0–10.5)

## 2013-12-28 LAB — BASIC METABOLIC PANEL
BUN: 8 mg/dL (ref 6–23)
CHLORIDE: 101 meq/L (ref 96–112)
CO2: 21 mEq/L (ref 19–32)
Calcium: 8.5 mg/dL (ref 8.4–10.5)
Creatinine, Ser: 0.61 mg/dL (ref 0.50–1.10)
Glucose, Bld: 108 mg/dL — ABNORMAL HIGH (ref 70–99)
Potassium: 5.8 mEq/L — ABNORMAL HIGH (ref 3.7–5.3)
Sodium: 134 mEq/L — ABNORMAL LOW (ref 137–147)

## 2013-12-28 LAB — POCT I-STAT TROPONIN I: TROPONIN I, POC: 0 ng/mL (ref 0.00–0.08)

## 2013-12-28 MED ORDER — ALBUTEROL SULFATE HFA 108 (90 BASE) MCG/ACT IN AERS
2.0000 | INHALATION_SPRAY | RESPIRATORY_TRACT | Status: DC
  Administered 2013-12-28: 2 via RESPIRATORY_TRACT
  Filled 2013-12-28 (×2): qty 6.7

## 2013-12-28 MED ORDER — PREDNISONE 10 MG PO TABS
20.0000 mg | ORAL_TABLET | Freq: Every day | ORAL | Status: DC
Start: 2013-12-28 — End: 2014-01-17

## 2013-12-28 NOTE — ED Notes (Signed)
Bed: JQ73 Expected date:  Expected time:  Means of arrival:  Comments: Closed

## 2013-12-28 NOTE — ED Notes (Signed)
Pt escorted to discharge window. Pt verbalized understanding discharge instructions. In no acute distress.  

## 2013-12-28 NOTE — ED Notes (Signed)
Pt alert and oriented x4. Respirations even and unlabored, bilateral symmetrical rise and fall of chest. Skin warm and dry. In no acute distress. Denies needs.   

## 2013-12-28 NOTE — Progress Notes (Signed)
Rt gave pt MDI with spacer. Pt knows and understands how to use. 

## 2013-12-28 NOTE — Discharge Instructions (Signed)

## 2013-12-28 NOTE — ED Provider Notes (Signed)
CSN: [id number]     Arrival date & time 12/28/13  1028 History   First MD Initiated Contact with Patient 12/28/13 1116     Chief Complaint  Patient presents with  . Shortness of Breath  . Cough     (Consider location/radiation/quality/duration/timing/severity/associated sxs/prior Treatment) Patient is a 38 y.o. female presenting with shortness of breath and cough. The history is provided by the patient.  Shortness of Breath Associated symptoms: cough   Cough Associated symptoms: shortness of breath    patient here complaining of cough x3 days that is worse when she is exposed to cool air. Notes subjective temperature without vomiting or diarrhea. Cough has been productive of green sputum and is similar to when she had pneumonia in the past. She does have a history of heavy tobacco use and continues to smoke a pack of cigarettes a day. Denies any anginal type chest pain. No vomiting or diarrhea. Denies any urinary symptoms. Seen using over-the-counter medications without relief.  Past Medical History  Diagnosis Date  . Migraine   . Kidney stone   . Interstitial cystitis   . Multiple allergies    Past Surgical History  Procedure Laterality Date  . Rhinoplasty    . Bladder augmentation    . Tubal ligation    . Knee surgery     History reviewed. No pertinent family history. History  Substance Use Topics  . Smoking status: Current Every Day Smoker  . Smokeless tobacco: Not on file  . Alcohol Use: No   OB History   Grav Para Term Preterm Abortions TAB SAB Ect Mult Living   5 3             Review of Systems  Respiratory: Positive for cough and shortness of breath.   All other systems reviewed and are negative.      Allergies  Amitriptyline; Entex; and Etodolac  Home Medications   Current Outpatient Rx  Name  Route  Sig  Dispense  Refill  . ibuprofen (ADVIL,MOTRIN) 200 MG tablet   Oral   Take 600 mg by mouth every 6 (six) hours as needed (fever).         .  Pseudoephedrine-DM-GG-APAP (TYLENOL COLD) 30-15-200-325 MG TABS   Oral   Take 2 tablets by mouth every 8 (eight) hours as needed (Cold Symptoms).          BP 121/74  Pulse 99  Temp(Src) 98.3 F (36.8 C) (Oral)  Resp 20  SpO2 100% Physical Exam  Nursing note and vitals reviewed. Constitutional: She is oriented to person, place, and time. She appears well-developed and well-nourished.  Non-toxic appearance. No distress.  HENT:  Head: Normocephalic and atraumatic.  Eyes: Conjunctivae, EOM and lids are normal. Pupils are equal, round, and reactive to light.  Neck: Normal range of motion. Neck supple. No tracheal deviation present. No mass present.  Cardiovascular: Normal rate, regular rhythm and normal heart sounds.  Exam reveals no gallop.   No murmur heard. Pulmonary/Chest: Effort normal. No stridor. No respiratory distress. She has decreased breath sounds. She has no wheezes. She has no rhonchi. She has no rales.  Abdominal: Soft. Normal appearance and bowel sounds are normal. She exhibits no distension. There is no tenderness. There is no rebound and no CVA tenderness.  Musculoskeletal: Normal range of motion. She exhibits no edema and no tenderness.  Neurological: She is alert and oriented to person, place, and time. She has normal strength. No cranial nerve deficit or sensory deficit. GCS  eye subscore is 4. GCS verbal subscore is 5. GCS motor subscore is 6.  Skin: Skin is warm and dry. No abrasion and no rash noted.  Psychiatric: She has a normal mood and affect. Her speech is normal and behavior is normal.    ED Course  Procedures (including critical care time) Labs Review Labs Reviewed  CBC - Abnormal; Notable for the following:    WBC 12.7 (*)    All other components within normal limits  BASIC METABOLIC PANEL  POCT I-STAT TROPONIN I   Imaging Review No results found.  EKG Interpretation    Date/Time:  Tuesday December 28 2013 10:46:51 EST Ventricular Rate:  90 PR  Interval:  116 QRS Duration: 89 QT Interval:  339 QTC Calculation: 415 R Axis:   77 Text Interpretation:  Sinus rhythm Borderline short PR interval Confirmed by Zenia Resides  MD, Vashti Bolanos ([id number]) on 12/28/2013 11:37:46 AM            MDM   Final diagnoses:  None    Patient to be treated for bronchitis with albuterol and course of prednisone.    Leota Jacobsen, MD 12/28/13 [phone]

## 2013-12-28 NOTE — ED Notes (Addendum)
Pt reports nonproductive cough x2 days, slight shortness of breath yesterday but today SOB increased, pt reports she is unable to catch her breath. Reports her chest "burns" when she takes a breath. Pain 4/10. Lung sounds clear. Vomiting, denies diarrhea.

## 2013-12-28 NOTE — Progress Notes (Signed)
[facility] provided pt with a list of primary care resources, [facility] information, and a [facility] Orange Card application to help patient establish primary care.

## 2014-01-17 ENCOUNTER — Emergency Department (HOSPITAL_COMMUNITY)
Admission: EM | Admit: 2014-01-17 | Discharge: 2014-01-17 | Discharge status: Against Medical Advice | Payer: Medicaid Other | Attending: Emergency Medicine | Admitting: Emergency Medicine

## 2014-01-17 ENCOUNTER — Encounter (HOSPITAL_BASED_OUTPATIENT_CLINIC_OR_DEPARTMENT_OTHER): Payer: Self-pay | Admitting: Emergency Medicine

## 2014-01-17 ENCOUNTER — Emergency Department (HOSPITAL_BASED_OUTPATIENT_CLINIC_OR_DEPARTMENT_OTHER): Payer: Medicaid Other

## 2014-01-17 ENCOUNTER — Emergency Department (HOSPITAL_BASED_OUTPATIENT_CLINIC_OR_DEPARTMENT_OTHER)
Admission: EM | Admit: 2014-01-17 | Discharge: 2014-01-17 | Disposition: A | Discharge status: Home / Self Care | Payer: Medicaid Other | Attending: Emergency Medicine | Admitting: Emergency Medicine

## 2014-01-17 ENCOUNTER — Encounter (HOSPITAL_COMMUNITY): Payer: Self-pay | Admitting: Emergency Medicine

## 2014-01-17 DIAGNOSIS — Z79899 Other long term (current) drug therapy: Secondary | ICD-10-CM | POA: Insufficient documentation

## 2014-01-17 DIAGNOSIS — F17200 Nicotine dependence, unspecified, uncomplicated: Secondary | ICD-10-CM | POA: Insufficient documentation

## 2014-01-17 DIAGNOSIS — Z87442 Personal history of urinary calculi: Secondary | ICD-10-CM | POA: Insufficient documentation

## 2014-01-17 DIAGNOSIS — Z87448 Personal history of other diseases of urinary system: Secondary | ICD-10-CM | POA: Insufficient documentation

## 2014-01-17 DIAGNOSIS — G43909 Migraine, unspecified, not intractable, without status migrainosus: Secondary | ICD-10-CM

## 2014-01-17 MED ORDER — DIPHENHYDRAMINE HCL 50 MG/ML IJ SOLN
25.0000 mg | Freq: Once | INTRAMUSCULAR | Status: AC
  Administered 2014-01-17: 25 mg via INTRAVENOUS
  Filled 2014-01-17: qty 1

## 2014-01-17 MED ORDER — SODIUM CHLORIDE 0.9 % IV BOLUS (SEPSIS)
1000.0000 mL | Freq: Once | INTRAVENOUS | Status: AC
  Administered 2014-01-17: 1000 mL via INTRAVENOUS

## 2014-01-17 MED ORDER — METHYLPREDNISOLONE SODIUM SUCC 125 MG IJ SOLR
125.0000 mg | Freq: Once | INTRAMUSCULAR | Status: AC
  Administered 2014-01-17: 125 mg via INTRAVENOUS
  Filled 2014-01-17: qty 2

## 2014-01-17 MED ORDER — METOCLOPRAMIDE HCL 5 MG/ML IJ SOLN
10.0000 mg | Freq: Once | INTRAMUSCULAR | Status: AC
Start: 2014-01-17 — End: 2014-01-17
  Administered 2014-01-17: 10 mg via INTRAVENOUS
  Filled 2014-01-17: qty 2

## 2014-01-17 NOTE — ED Notes (Signed)
Per pt sts that she has had a migraine since yesterday associated with N,V. sts blurred vision. sts taking OTC meds without relief.

## 2014-01-17 NOTE — ED Notes (Signed)
Headache x 2 days. Hx of migraines. States her speech has been slurred and she cant focus with her eyes. Chills.

## 2014-01-17 NOTE — ED Notes (Signed)
Pt stated to registration that she is leaving.

## 2014-01-17 NOTE — ED Provider Notes (Signed)
CSN: [id number]     Arrival date & time 01/17/14  1853 History  This chart was scribed for Brandi Johns, MD by Marcha Dutton, ED Scribe. This patient was seen in room MH02/MH02 and the patient's care was started at 8:08 PM.    Chief Complaint  Patient presents with  . Headache     The history is provided by the patient. A language interpreter was used.   HPI Comments: SONG Brandi Morris is a 38 y.o. female who presents to the Emergency Department complaining of gradually worsening head ache that she states came on quickly in the back of her head yesterday. She reports a h/o of migraines with similar pain however it is usually located in the top of her head. Pt reports using 2 doses of BC powder and excedrin yesterday with minimal relief. Pt denies numbness, weakness in extremities. She also reports a metallic taste in her mouth she hasn't been able to get rid of.  She states that this pain feels different and worse than her typical migraines.   Past Medical History  Diagnosis Date  . Migraine   . Kidney stone   . Interstitial cystitis   . Multiple allergies    Past Surgical History  Procedure Laterality Date  . Rhinoplasty    . Bladder augmentation    . Tubal ligation    . Knee surgery     No family history on file. History  Substance Use Topics  . Smoking status: Current Every Day Smoker  . Smokeless tobacco: Not on file  . Alcohol Use: No   OB History   Grav Para Term Preterm Abortions TAB SAB Ect Mult Living   5 3             Review of Systems  Constitutional: Negative for fever, chills, diaphoresis and fatigue.  HENT: Negative for congestion, rhinorrhea and sneezing.   Eyes: Negative.   Respiratory: Negative for cough, chest tightness and shortness of breath.   Cardiovascular: Negative for chest pain and leg swelling.  Gastrointestinal: Positive for nausea and vomiting. Negative for abdominal pain, diarrhea and blood in stool.  Genitourinary: Negative for  frequency, hematuria, flank pain and difficulty urinating.  Musculoskeletal: Negative for arthralgias and back pain.  Skin: Negative for rash.  Neurological: Positive for headaches. Negative for dizziness, speech difficulty, weakness and numbness.      Allergies  Amitriptyline; Entex; Etodolac; Flexeril; Lavender oil; and Robaxin  Home Medications   Current Outpatient Rx  Name  Route  Sig  Dispense  Refill  . albuterol (PROVENTIL HFA;VENTOLIN HFA) 108 (90 BASE) MCG/ACT inhaler   Inhalation   Inhale 1-2 puffs into the lungs every 6 (six) hours as needed for wheezing or shortness of breath.         Marland Kitchen aspirin-acetaminophen-caffeine (EXCEDRIN MIGRAINE) 250-250-65 MG per tablet   Oral   Take 2 tablets by mouth every 4 (four) hours as needed for migraine.         . diphenhydrAMINE (BENADRYL) 25 MG tablet   Oral   Take 50 mg by mouth 3 (three) times daily.         Marland Kitchen EPINEPHrine (EPI-PEN) 0.3 mg/0.3 mL SOAJ injection   Intramuscular   Inject 0.3 mg into the muscle daily as needed (allergic reaction).         Marland Kitchen ibuprofen (ADVIL,MOTRIN) 200 MG tablet   Oral   Take 600 mg by mouth every 6 (six) hours as needed (fever).  Triage Vitals: BP 113/76  Pulse 93  Temp(Src) 98.3 F (36.8 C) (Oral)  Resp 18  Ht 5\' 2"  (1.575 m)  Wt 161 lb (73.029 kg)  BMI 29.44 kg/m2  SpO2 98%  Physical Exam  Constitutional: She is oriented to person, place, and time. She appears well-developed and well-nourished.  HENT:  Head: Normocephalic and atraumatic.  Eyes: Pupils are equal, round, and reactive to light.  Neck: Normal range of motion. Neck supple.  Cardiovascular: Normal rate, regular rhythm and normal heart sounds.   Pulmonary/Chest: Effort normal and breath sounds normal. No respiratory distress. She has no wheezes. She has no rales. She exhibits no tenderness.  Abdominal: Soft. Bowel sounds are normal. There is no tenderness. There is no rebound and no guarding.   Musculoskeletal: Normal range of motion. She exhibits no edema.  Lymphadenopathy:    She has no cervical adenopathy.  Neurological: She is alert and oriented to person, place, and time. No cranial nerve deficit.  Motor 5/5 all ext, sensation grossly intact to LT all extremities.  FTN intact, no pronator drift  Skin: Skin is warm and dry. No rash noted.  Psychiatric: She has a normal mood and affect.    ED Course  Procedures (including critical care time)  DIAGNOSTIC STUDIES: Oxygen Saturation is 98% on RA, normal by my interpretation.    COORDINATION OF CARE: 8:11 PM- Will order CT scan of head. Pt advised of plan for treatment and pt agrees.    Labs Review Labs Reviewed - No data to display Imaging Review Ct Head Wo Contrast  01/17/2014   CLINICAL DATA:  Headache 2 days with blurred vision.  EXAM: CT HEAD WITHOUT CONTRAST  TECHNIQUE: Contiguous axial images were obtained from the base of the skull through the vertex without intravenous contrast.  COMPARISON:  12/07/2008 and 06/23/2008  FINDINGS: Ventricles, cisterns and other CSF spaces are within normal. There is no mass, mass effect, shift of midline structures or acute hemorrhage. There is no evidence to suggest acute infarction. Remaining bones soft tissues are within normal.  IMPRESSION: No acute intracranial findings.   Electronically Signed   By: Marin Olp M.D.   On: 01/17/2014 21:24     EKG Interpretation None      MDM   Final diagnoses:  Migraine    Patient presents with a headache. She states it's different than her typical migraine headaches. She was given a migraine cocktail and felt much better after that. Given that she said her headache was different and worse than her typical migraines, I did do a head CT which was negative. I advised her that an LP was indicated next, however she is refusing this at this point. I did advise her that she could potentially have a subarachnoid hemorrhage or meningitis that do  not show up on the CT scan and that could be life-threatening. She understands this and is refusing LP. She states she'll return if her headache worsens again or she changes her mind about the LP.  I personally performed the services described in this documentation, which was scribed in my presence.  The recorded information has been reviewed and considered.     Brandi Johns, MD 01/17/14 2138

## 2014-01-17 NOTE — Discharge Instructions (Signed)
Migraine Headache A migraine headache is an intense, throbbing pain on one or both sides of your head. A migraine can last for 30 minutes to several hours. CAUSES  The exact cause of a migraine headache is not always known. However, a migraine may be caused when nerves in the brain become irritated and release chemicals that cause inflammation. This causes pain. Certain things may also trigger migraines, such as:  Alcohol.  Smoking.  Stress.  Menstruation.  Aged cheeses.  Foods or drinks that contain nitrates, glutamate, aspartame, or tyramine.  Lack of sleep.  Chocolate.  Caffeine.  Hunger.  Physical exertion.  Fatigue.  Medicines used to treat chest pain (nitroglycerine), birth control pills, estrogen, and some blood pressure medicines. SIGNS AND SYMPTOMS  Pain on one or both sides of your head.  Pulsating or throbbing pain.  Severe pain that prevents daily activities.  Pain that is aggravated by any physical activity.  Nausea, vomiting, or both.  Dizziness.  Pain with exposure to bright lights, loud noises, or activity.  General sensitivity to bright lights, loud noises, or smells. Before you get a migraine, you may get warning signs that a migraine is coming (aura). An aura may include:  Seeing flashing lights.  Seeing bright spots, halos, or zig-zag lines.  Having tunnel vision or blurred vision.  Having feelings of numbness or tingling.  Having trouble talking.  Having muscle weakness. DIAGNOSIS  A migraine headache is often diagnosed based on:  Symptoms.  Physical exam.  A CT scan or MRI of your head. These imaging tests cannot diagnose migraines, but they can help rule out other causes of headaches. TREATMENT Medicines may be given for pain and nausea. Medicines can also be given to help prevent recurrent migraines.  HOME CARE INSTRUCTIONS  Only take over-the-counter or prescription medicines for pain or discomfort as directed by your  health care provider. The use of long-term narcotics is not recommended.  Lie down in a dark, quiet room when you have a migraine.  Keep a journal to find out what may trigger your migraine headaches. For example, write down:  What you eat and drink.  How much sleep you get.  Any change to your diet or medicines.  Limit alcohol consumption.  Quit smoking if you smoke.  Get 7 9 hours of sleep, or as recommended by your health care provider.  Limit stress.  Keep lights dim if bright lights bother you and make your migraines worse. SEEK IMMEDIATE MEDICAL CARE IF:   Your migraine becomes severe.  You have a fever.  You have a stiff neck.  You have vision loss.  You have muscular weakness or loss of muscle control.  You start losing your balance or have trouble walking.  You feel faint or pass out.  You have severe symptoms that are different from your first symptoms. MAKE SURE YOU:   Understand these instructions.  Will watch your condition.  Will get help right away if you are not doing well or get worse. Document Released: 11/04/2005 Document Revised: 08/25/2013 Document Reviewed: 07/12/2013 [employer] Patient Information 2014 [employer].  

## 2014-03-08 ENCOUNTER — Encounter (HOSPITAL_COMMUNITY): Payer: Self-pay | Admitting: Emergency Medicine

## 2014-03-08 ENCOUNTER — Emergency Department (HOSPITAL_COMMUNITY)
Admission: EM | Admit: 2014-03-08 | Discharge: 2014-03-08 | Disposition: A | Discharge status: Home / Self Care | Payer: Medicaid Other | Attending: Emergency Medicine | Admitting: Emergency Medicine

## 2014-03-08 DIAGNOSIS — F39 Unspecified mood [affective] disorder: Secondary | ICD-10-CM | POA: Insufficient documentation

## 2014-03-08 DIAGNOSIS — R197 Diarrhea, unspecified: Secondary | ICD-10-CM | POA: Insufficient documentation

## 2014-03-08 DIAGNOSIS — Z87442 Personal history of urinary calculi: Secondary | ICD-10-CM | POA: Insufficient documentation

## 2014-03-08 DIAGNOSIS — R5383 Other fatigue: Secondary | ICD-10-CM

## 2014-03-08 DIAGNOSIS — F3289 Other specified depressive episodes: Secondary | ICD-10-CM | POA: Insufficient documentation

## 2014-03-08 DIAGNOSIS — Z79899 Other long term (current) drug therapy: Secondary | ICD-10-CM | POA: Insufficient documentation

## 2014-03-08 DIAGNOSIS — R5381 Other malaise: Secondary | ICD-10-CM | POA: Insufficient documentation

## 2014-03-08 DIAGNOSIS — F329 Major depressive disorder, single episode, unspecified: Secondary | ICD-10-CM | POA: Insufficient documentation

## 2014-03-08 DIAGNOSIS — Z9851 Tubal ligation status: Secondary | ICD-10-CM | POA: Insufficient documentation

## 2014-03-08 DIAGNOSIS — G43909 Migraine, unspecified, not intractable, without status migrainosus: Secondary | ICD-10-CM | POA: Insufficient documentation

## 2014-03-08 DIAGNOSIS — Z7982 Long term (current) use of aspirin: Secondary | ICD-10-CM | POA: Insufficient documentation

## 2014-03-08 DIAGNOSIS — F430 Acute stress reaction: Secondary | ICD-10-CM | POA: Insufficient documentation

## 2014-03-08 DIAGNOSIS — Z3202 Encounter for pregnancy test, result negative: Secondary | ICD-10-CM | POA: Insufficient documentation

## 2014-03-08 DIAGNOSIS — F411 Generalized anxiety disorder: Secondary | ICD-10-CM | POA: Insufficient documentation

## 2014-03-08 DIAGNOSIS — Z791 Long term (current) use of non-steroidal anti-inflammatories (NSAID): Secondary | ICD-10-CM | POA: Insufficient documentation

## 2014-03-08 DIAGNOSIS — Z87448 Personal history of other diseases of urinary system: Secondary | ICD-10-CM | POA: Insufficient documentation

## 2014-03-08 DIAGNOSIS — F17200 Nicotine dependence, unspecified, uncomplicated: Secondary | ICD-10-CM | POA: Insufficient documentation

## 2014-03-08 DIAGNOSIS — R112 Nausea with vomiting, unspecified: Secondary | ICD-10-CM | POA: Insufficient documentation

## 2014-03-08 LAB — COMPREHENSIVE METABOLIC PANEL
ALBUMIN: 3.8 g/dL (ref 3.5–5.2)
ALK PHOS: 62 U/L (ref 39–117)
ALT: 7 U/L (ref 0–35)
AST: 10 U/L (ref 0–37)
BILIRUBIN TOTAL: 0.2 mg/dL — AB (ref 0.3–1.2)
BUN: 14 mg/dL (ref 6–23)
CHLORIDE: 106 meq/L (ref 96–112)
CO2: 24 meq/L (ref 19–32)
Calcium: 9.2 mg/dL (ref 8.4–10.5)
Creatinine, Ser: 0.83 mg/dL (ref 0.50–1.10)
GFR calc Af Amer: >90 mL/min (ref >90)
GFR calc non Af Amer: 88 mL/min — ABNORMAL LOW (ref >90)
Glucose, Bld: 94 mg/dL (ref 70–99)
POTASSIUM: 4.4 meq/L (ref 3.7–5.3)
Sodium: 142 mEq/L (ref 137–147)
Total Protein: 6.4 g/dL (ref 6.0–8.3)

## 2014-03-08 LAB — PREGNANCY, URINE: Preg Test, Ur: NEGATIVE

## 2014-03-08 LAB — URINALYSIS, ROUTINE W REFLEX MICROSCOPIC
BILIRUBIN URINE: NEGATIVE
Glucose, UA: NEGATIVE mg/dL
Hgb urine dipstick: NEGATIVE
Ketones, ur: NEGATIVE mg/dL
Nitrite: NEGATIVE
PH: 6.5 (ref 5.0–8.0)
Protein, ur: NEGATIVE mg/dL
Specific Gravity, Urine: 1.013 (ref 1.005–1.030)
Urobilinogen, UA: 0.2 mg/dL (ref 0.0–1.0)

## 2014-03-08 LAB — CBC WITH DIFFERENTIAL/PLATELET
BASOS ABS: 0 10*3/uL (ref 0.0–0.1)
Basophils Relative: 1 % (ref 0–1)
Eosinophils Absolute: 0.5 10*3/uL (ref 0.0–0.7)
Eosinophils Relative: 7 % — ABNORMAL HIGH (ref 0–5)
HCT: 42.7 % (ref 36.0–46.0)
HEMOGLOBIN: 14.2 g/dL (ref 12.0–15.0)
LYMPHS PCT: 25 % (ref 12–46)
Lymphs Abs: 2 10*3/uL (ref 0.7–4.0)
MCH: 29.5 pg (ref 26.0–34.0)
MCHC: 33.3 g/dL (ref 30.0–36.0)
MCV: 88.8 fL (ref 78.0–100.0)
Monocytes Absolute: 0.7 10*3/uL (ref 0.1–1.0)
Monocytes Relative: 8 % (ref 3–12)
NEUTROS ABS: 4.6 10*3/uL (ref 1.7–7.7)
NEUTROS PCT: 59 % (ref 43–77)
Platelets: 218 10*3/uL (ref 150–400)
RBC: 4.81 MIL/uL (ref 3.87–5.11)
RDW: 13.4 % (ref 11.5–15.5)
WBC: 7.8 10*3/uL (ref 4.0–10.5)

## 2014-03-08 LAB — URINE MICROSCOPIC-ADD ON

## 2014-03-08 LAB — LIPASE, BLOOD: Lipase: 25 U/L (ref 11–59)

## 2014-03-08 MED ORDER — SODIUM CHLORIDE 0.9 % IV BOLUS (SEPSIS)
1000.0000 mL | Freq: Once | INTRAVENOUS | Status: AC
  Administered 2014-03-08: 1000 mL via INTRAVENOUS

## 2014-03-08 NOTE — ED Notes (Signed)
Pt c/o nausea, diarrhea, fatigue, indigestion, and brusiing on body x 1 month. Has not seen be seen for any of these symptoms. Pt also states she gets dizzy and lightheaded at times.

## 2014-03-08 NOTE — Progress Notes (Signed)
[facility] provided pt with a list of primary care resources. Patient stated that she was going to [facility] with Dr. Jimmye Norman, MD. Patient stated that she was pending insurance through job.

## 2014-03-08 NOTE — ED Provider Notes (Signed)
CSN: [id number]     Arrival date & time 03/08/14  1018 History   First MD Initiated Contact with Patient 03/08/14 1101     Chief Complaint  Patient presents with  . Nausea  . Diarrhea  . Gastrophageal Reflux  . Fatigue     (Consider location/radiation/quality/duration/timing/severity/associated sxs/prior Treatment) HPI Comments: Patient presents emergency department with chief complaint of anxiety/depression. She states that she has over the past month had nausea, diarrhea, fatigue, and indigestion. He states that she has been very stressed because of social and work issues. She denies any chest pain, or abdominal pain. She denies fevers or chills. She states that sometimes she is dizzy and lightheaded. She is tried taking OTC antidiarrheals. She states her symptoms are aggravated by eating. They're relieved by nothing. She states that prior to the diarrhea, she does have some crampy abdominal pain. Denies any foreign travel, or recent surgery.  The history is provided by the patient. No language interpreter was used.    Past Medical History  Diagnosis Date  . Migraine   . Kidney stone   . Interstitial cystitis   . Multiple allergies    Past Surgical History  Procedure Laterality Date  . Rhinoplasty    . Bladder augmentation    . Tubal ligation    . Knee surgery     No family history on file. History  Substance Use Topics  . Smoking status: Current Every Day Smoker  . Smokeless tobacco: Not on file  . Alcohol Use: No   OB History   Grav Para Term Preterm Abortions TAB SAB Ect Mult Living   5 3             Review of Systems  Constitutional: Negative for fever and chills.  Respiratory: Negative for shortness of breath.   Cardiovascular: Negative for chest pain.  Gastrointestinal: Positive for nausea, vomiting and diarrhea. Negative for constipation.  Genitourinary: Negative for dysuria.  Psychiatric/Behavioral: Positive for dysphoric mood. The patient is  nervous/anxious.       Allergies  Amitriptyline; Entex; Etodolac; Flexeril; Lavender oil; and Robaxin  Home Medications   Prior to Admission medications   Medication Sig Start Date End Date Taking? Authorizing Provider  albuterol (PROVENTIL HFA;VENTOLIN HFA) 108 (90 BASE) MCG/ACT inhaler Inhale 1-2 puffs into the lungs every 6 (six) hours as needed for wheezing or shortness of breath.   Yes Historical Provider, MD  aspirin-acetaminophen-caffeine (EXCEDRIN MIGRAINE) [phone] MG per tablet Take 2 tablets by mouth every 4 (four) hours as needed for migraine.   Yes Historical Provider, MD  diphenhydrAMINE (BENADRYL) 25 MG tablet Take 50 mg by mouth 3 (three) times daily.   Yes Historical Provider, MD  ibuprofen (ADVIL,MOTRIN) 200 MG tablet Take 600 mg by mouth every 6 (six) hours as needed (fever).   Yes Historical Provider, MD  EPINEPHrine (EPI-PEN) 0.3 mg/0.3 mL SOAJ injection Inject 0.3 mg into the muscle daily as needed (allergic reaction).    Historical Provider, MD   BP 118/85  Pulse 89  Temp(Src) 97.8 F (36.6 C) (Oral)  Resp 18  SpO2 99% Physical Exam  Nursing note and vitals reviewed. Constitutional: She is oriented to person, place, and time. She appears well-developed and well-nourished.  HENT:  Head: Normocephalic and atraumatic.  Eyes: Conjunctivae and EOM are normal. Pupils are equal, round, and reactive to light.  Neck: Normal range of motion. Neck supple.  Cardiovascular: Normal rate and regular rhythm.  Exam reveals no gallop and no friction rub.  No murmur heard. Pulmonary/Chest: Effort normal and breath sounds normal. No respiratory distress. She has no wheezes. She has no rales. She exhibits no tenderness.  Abdominal: Soft. She exhibits no distension and no mass. There is no tenderness. There is no rebound and no guarding.  No focal abdominal tenderness, no RLQ tenderness or pain at McBurney's point, no RUQ tenderness or Murphy's sign, no left-sided abdominal  tenderness, no fluid wave, or signs of peritonitis   Musculoskeletal: Normal range of motion. She exhibits no edema and no tenderness.  Neurological: She is alert and oriented to person, place, and time.  Skin: Skin is warm and dry.  Scattered ecchymosis on lower extremities, no signs of cellulitis  Psychiatric: She has a normal mood and affect. Her behavior is normal. Judgment and thought content normal.    ED Course  Procedures (including critical care time) Results for orders placed during the hospital encounter of 03/08/14  CBC WITH DIFFERENTIAL      Result Value Ref Range   WBC 7.8  4.0 - 10.5 K/uL   RBC 4.81  3.87 - 5.11 MIL/uL   Hemoglobin 14.2  12.0 - 15.0 g/dL   HCT 42.7  36.0 - 46.0 %   MCV 88.8  78.0 - 100.0 fL   MCH 29.5  26.0 - 34.0 pg   MCHC 33.3  30.0 - 36.0 g/dL   RDW 13.4  11.5 - 15.5 %   Platelets 218  150 - 400 K/uL   Neutrophils Relative % 59  43 - 77 %   Neutro Abs 4.6  1.7 - 7.7 K/uL   Lymphocytes Relative 25  12 - 46 %   Lymphs Abs 2.0  0.7 - 4.0 K/uL   Monocytes Relative 8  3 - 12 %   Monocytes Absolute 0.7  0.1 - 1.0 K/uL   Eosinophils Relative 7 (*) 0 - 5 %   Eosinophils Absolute 0.5  0.0 - 0.7 K/uL   Basophils Relative 1  0 - 1 %   Basophils Absolute 0.0  0.0 - 0.1 K/uL  COMPREHENSIVE METABOLIC PANEL      Result Value Ref Range   Sodium 142  137 - 147 mEq/L   Potassium 4.4  3.7 - 5.3 mEq/L   Chloride 106  96 - 112 mEq/L   CO2 24  19 - 32 mEq/L   Glucose, Bld 94  70 - 99 mg/dL   BUN 14  6 - 23 mg/dL   Creatinine, Ser 0.83  0.50 - 1.10 mg/dL   Calcium 9.2  8.4 - 10.5 mg/dL   Total Protein 6.4  6.0 - 8.3 g/dL   Albumin 3.8  3.5 - 5.2 g/dL   AST 10  0 - 37 U/L   ALT 7  0 - 35 U/L   Alkaline Phosphatase 62  39 - 117 U/L   Total Bilirubin 0.2 (*) 0.3 - 1.2 mg/dL   GFR calc non Af Amer 88 (*) >90 mL/min   GFR calc Af Amer >90  >90 mL/min  LIPASE, BLOOD      Result Value Ref Range   Lipase 25  11 - 59 U/L  URINALYSIS, ROUTINE W REFLEX  MICROSCOPIC      Result Value Ref Range   Color, Urine YELLOW  YELLOW   APPearance CLOUDY (*) CLEAR   Specific Gravity, Urine 1.013  1.005 - 1.030   pH 6.5  5.0 - 8.0   Glucose, UA NEGATIVE  NEGATIVE mg/dL   Hgb urine dipstick  NEGATIVE  NEGATIVE   Bilirubin Urine NEGATIVE  NEGATIVE   Ketones, ur NEGATIVE  NEGATIVE mg/dL   Protein, ur NEGATIVE  NEGATIVE mg/dL   Urobilinogen, UA 0.2  0.0 - 1.0 mg/dL   Nitrite NEGATIVE  NEGATIVE   Leukocytes, UA TRACE (*) NEGATIVE  PREGNANCY, URINE      Result Value Ref Range   Preg Test, Ur NEGATIVE  NEGATIVE  URINE MICROSCOPIC-ADD ON      Result Value Ref Range   Squamous Epithelial / LPF FEW (*) RARE   WBC, UA 3-6  <3 WBC/hpf   RBC / HPF 0-2  <3 RBC/hpf   Bacteria, UA FEW (*) RARE   No results found.   Imaging Review No results found.   EKG Interpretation None      MDM   Final diagnoses:  Diarrhea    Patient diarrhea. She states that she's been having symptoms for the past month. The diarrhea is nonbloody, nonbilious. Abdomen is benign on exam. No imaging indicated at this time. Labs are unremarkable. Vitals are stable. Discussed patient with Dr. Tamera Punt, who recommends gastroenterology followup. Will give the patient instructions regarding digestive diseases such as celiacs. Recommend GI followup. Patient understands and agrees with the plan. She is stable and ready for discharge.  Patient instructed to return for:  New or worsening symptoms, including, increased abdominal pain, especially pain that localizes to one side, bloody vomit, bloody diarrhea, fever >101, and intractable vomiting.     Montine Circle, PA-C 03/08/14 1238

## 2014-03-08 NOTE — ED Notes (Signed)
MD at bedside. EDPA Herbie Baltimore present to evaluate this pt

## 2014-03-08 NOTE — Discharge Instructions (Signed)
Celiac Disease Celiac disease is a digestive disease that causes your body's natural defense system (immune system) to react against its own cells. It interferes with taking in (absorbing) nutrients from food. Celiac disease is also known as celiac sprue, nontropical sprue, and gluten-sensitive enteropathy. People who have celiac disease cannot tolerate gluten. Gluten is a protein found in wheat, rye, and barley. With time, celiac disease will damage the cells lining the small intestine. This leads to being unable to absorb nutrients from food (malabsorption), diarrhea, and nutritional problems. CAUSES  Celiac disease is genetic. This means you have a higher likelihood of getting the disease if someone in your family has or has had it. Up to 10% of your close relatives (parent, sibling, child) may also have the disease.  People with celiac disease tend to have other autoimmune diseases. The link may be genetic. These diseases include:  Dermatitis herpetiformis.  Thyroid disease.  Systemic lupus erythematosis.  Type 1 diabetes.  Liver disease.  Collagen vascular disease.  Rheumatoid arthritis.  Sjgren's syndrome. SYMPTOMS  The symptoms of celiac disease vary from person to person. The symptoms are generally digestive or nutritional. Digestive symptoms include:  Recurring belly (abdominal) bloating and pain.  Gas.  Long-term (chronic) diarrhea.  Pale, bad-smelling, greasy, or oily stool. Nutritional symptoms include:  Failure to thrive in infants.  Delayed growth in children.  Weight loss in children and adults.  Missed menstrual periods (often due to extreme weight loss).  Anemia.  Weakening bones (osteoporosis).  Fatigue and weakness.  Tingling or other signs of nerve damage (peripheral neuropathy).  Depression. DIAGNOSIS  If your symptoms and physical exam suggest that a digestive disorder or malnutrition is present, your caregiver may suspect celiac disease. You  may have already begun a gluten-free diet. If symptoms persist, testing may be needed to confirm the diagnosis. Some tests are best done while you are on a normal, unrestricted diet. Tests may include:  Blood tests to check for nutritional deficiencies.  Blood tests to look for evidence that the body is producing antibodies against its own small intestine cells.  Taking a tissue sample (biopsy) from the small bowel for evaluation.  X-rays of the small bowel.  Evaluating the stool for fat.  Tests to check for nutrient absorption from the intestine. TREATMENT  It is important to seek treatment. Untreated celiac disease can cause growth problems (in children), anemia, osteoporosis, and possible nerve problems. A pregnant patient with untreated celiac disease has a higher risk of miscarriage, and the fetus has an increased risk of low birth weight and other growth problems. If celiac disease is diagnosed in the early stages, treatment can allow you to live a long, nearly symptom-free life. Treatment includes following a gluten-free diet. This means avoiding all foods that contain gluten. Eating even a small amount of gluten can damage your intestine. For most people, following this diet will stop symptoms. It will heal existing intestinal damage and prevent further damage. Improvements begin within days of starting the diet. The small intestine is usually completely healed within 3 to 6 months, or it may take up to 2 years for older adults. A small percentage of people do not improve on the gluten-free diet. Depending on your age and the stage at which you were diagnosed, some problems such as delayed growth and discolored teeth may not improve. Sometimes, damaged intestines cannot heal. If your intestines are not absorbing enough nutrients, you may need to receive nutrition supplements through an intravenous (IV) tube.  Drug treatments are being tested for unresponsive celiac disease. In this case, you  may need to be evaluated for complications of the disease. Your caregiver may also recommend:  A pneumonia vaccination.  Nutrients and other treatments for any nutritional deficiencies. Your caregiver can provide you with more information on a gluten-free diet. Discussion with a dietician skilled in this illness will be valuable. Support groups may also be helpful. HOME CARE INSTRUCTIONS   Focus on a gluten-free diet. This diet must become a way of life.  Monitor your response to the gluten-free diet and treat any nutritional deficiencies.  Prepare ahead of time if you decide to eat outside the home.  Make and keep your regular follow-up visits with your caregiver.  Suggest to family members that they get screened for early signs of the disease. SEEK MEDICAL CARE IF:   You continue to have digestive symptoms (gas, cramping, diarrhea) despite a proper diet.  You have trouble sticking to the gluten-free diet.  You develop an itchy rash with groups of tiny blisters.  You develop severe weakness, balance problems, menstrual problems, or depression. Document Released: 11/04/2005 Document Revised: 01/27/2012 Document Reviewed: 02/21/2010 [facility] Patient Information 2014 [place], [place]. Gluten-Free Diet Gluten is a protein found in many grains. Gluten is present in wheat, rye, and barley. Gluten from wheat, rye, and barley protein interferes with the absorption of food in people with gluten sensitivity. It may also cause intestinal injury when eaten by individuals with gluten sensitivity.  A sample piece (biopsy) of the small intestine is usually required for a positive diagnosis of gluten sensitivity. Dietary treatment consists of eliminating foods and food ingredients from wheat, rye, and barley. When these are taken out of the diet completely, most people regain function of the small intestine. Strict compliance is important even during symptom-free periods. People with gluten  sensitivity need to be on a gluten-free diet for a lifetime. During the first stages of treatment, some people will also need to restrict dairy products that contain lactose, which is a naturally occurring sugar. Lactose is difficult to absorb when the small intestines are damaged (lactose intolerance).  WHO NEEDS THIS DIET Some people who have certain diseases need to be on a gluten-free diet. These diseases include:  Celiac disease.  Nontropical sprue.  Gluten-sensitive enteropathy.  Dermatitis herpetiformis. SPECIAL NOTES  Read all labels because gluten may have been added as an incidental ingredient. Words to check for on the label include: flour, starch, durum flour, graham flour, phosphated flour, self-rising flour, semolina, farina, modified food starch, cereal, thickening, fillers, emulsifiers, any kind of malt flavoring, and hydrolyzed vegetable protein. A registered dietician can help you identify possible harmful ingredients in the foods you normally eat.  If you are not sure whether an ingredient contains gluten, check with the manufacturer. Note that some manufacturers may change ingredients without notice. Always read labels.  Since flour and cereal products are often used in the preparation of foods, it is important to be aware of the methods of preparation used, as well as the foods themselves. This is especially true when you are dining out. Starches  Allowed: Only those prepared from arrowroot, corn, potato, rice, and bean flours. Rice wafers(*), pure cornmeal tortillas, popcorn, some crackers, and chips(*). Hot cereals made from cornmeal. Ask your dietician which specific hot and cold cereals are allowed. White or sweet potatoes, yams, hominy, rice or wild rice, and special gluten-free pasta. Some oriental rice noodles or bean noodles.  Avoid: All wheat and  rye cereals, wheat germ, barley, bran, graham, malt, bulgur, and millet(-). NOTE: Avoid cereals containing malt as a  flavoring, such as rice cereal. Regular noodles, spaghetti, macaroni, and most packaged rice mixes(*). All others containing wheat, rye, or barley. Vegetables  Allowed: All plain, fresh, frozen, or canned vegetables.  Avoid: Creamed vegetables(*) and vegetables canned in sauces(*). Any prepared with wheat, rye, or barley. Fruit  Allowed: All fresh, frozen, canned, or dried fruits. Fruit juices.  Avoid: Thickened or prepared fruits and some pie fillings(*). Meat and Meat Substitutes  Allowed: Meat, fish, poultry, or eggs prepared without added wheat, rye, or barley. Luncheon meat(*), frankfurters(*), and pure meat. All aged cheese and processed cheese products(*). Cottage cheese(+) and cream cheese(+). Dried beans, dried peas, and lentils.  Avoid: Any meat or meat alternate containing wheat, rye, barley, or gluten stabilizers. Bread-containing products, such as Swiss steak, croquettes, and meatloaf. Tuna canned in vegetable broth(*); [place] with HVP injected as part of the basting; any cheese product containing oat gum as an ingredient. Milk  Allowed: Milk. Yogurt made with allowed ingredients(*).  Avoid: Commercial chocolate milk which may have cereal added(*). Malted milk. Soups and Combination Foods  Allowed: Homemade broth and soups made with allowed ingredients; some canned or frozen soups are allowed(*). Combination or prepared foods that do not contain gluten(*). Read labels.  Avoid: All soups containing wheat, rye, or barley flour. Bouillon and bouillon cubes that contain hydrolyzed vegetable protein (HVP). Combination or prepared foods that contain gluten(*). Desserts  Allowed:  Custard, some pudding mixes(*), homemade puddings from cornstarch, rice, and tapioca. Gelatin desserts, ices, and sherbet(*). Cake, cookies, and other desserts prepared with allowed flours. Some commercial ice creams(*). Ask your dietician about specific brands of dessert that are allowed.  Avoid: Cakes,  cookies, doughnuts, and pastries that are prepared with wheat, rye, or barley flour. Some commercial ice creams(*), ice cream flavors which contain cookies, crumbs, or cheesecake(*). Ice cream cones. All commercially prepared mixes for cakes, cookies, and other desserts(*). Bread pudding and other puddings thickened with flour. Sweets  Allowed: Sugar, honey, syrup(*), molasses, jelly, jam, plain hard candy, marshmallows, gumdrops, homemade candies free from wheat, rye, or barley. Coconut.  Avoid: Commercial candies containing wheat, rye, or barley(*). Certain buttercrunch toffees are dusted with wheat flour. Ask your dietician about specific brands that are not allowed. Chocolate-coated nuts, which are often rolled in flour. Fats and Oils  Allowed: Butter, margarine, vegetable oil, sour cream(+), whipping cream, shortening, lard, cream, mayonnaise(*). Some commercial salad dressings(*). Peanut butter.  Avoid: Some commercial salad dressings(*). Beverages  Allowed: Coffee (regular or decaffeinated), tea, herbal tea (read label to be sure that no wheat flour has been added). Carbonated beverages and some root beers(*). Wine, sake, and distilled spirits, such as gin, vodka, and whiskey.  Avoid:  Certain cereal beverages. Ask your dietician about specific brands that are not allowed. Beer (unless gluten-free), ale, malted milk, and some root beers, wine, and sake. Condiments/ Miscellaneous  Allowed: Salt, pepper, herbs, spices, extracts, and food colorings. Monosodium glutamate (MSG). Cider, rice, and wine vinegar. Baking soda and baking powder. Certain soy sauces. Ask your dietician about specific brands that are allowed. Nuts, coconut, chocolate, and pure cocoa powder.  Avoid: Some curry powder(*), some dry seasoning mixes(*), some gravy extracts(*), some meat sauces(*), some catsup(*), some prepared mustard(*), horseradish(*), some soy sauce(*), chip dips(*), and some chewing gum(*). Yeast extract  (contains barley). Caramel color (may contain malt). Ask your dietician about specific brands of condiments to avoid.  Flour and Thickening Agents  Allowed: Arrowroot starch (A); Corn bran (B); Corn flour (B,C,D); Corn germ (B); Cornmeal (B,C,D); Corn starch (A); Potato flour (B,C,E); Potato starch flour (B,C,E); Rice bran (B); Rice flours: Plain, brown (B,C,D,E), and Sweet (A,B,C,F). Rice polish (B,C,G); Soy flour (B,C,G); Tapioca starch (A). The flour and thickening agents described above are good for: (A) Good thickening agent (B) Good when combined with other flours (C) Best combined with milk and eggs in baked products (D) Best in grainy-textured products (E) Produces drier product than other flours (F) Produces moister product than other flours (G) Adds distinct flavor to product. Use in moderation. (*) Check labels and investigate any questionable ingredients.  (-) Additional research is needed before this product can be recommended. (+) Check vegetable gum used. SAMPLE MEAL PLAN Breakfast   Orange juice.  Banana.  Rice or corn cereal.  Toast (gluten-free bread).  Heart-healthy tub margarine.  Jam.  Milk.  Coffee or tea. Lunch  Chicken salad sandwich (with gluten-free bread and mayonnaise).  Sliced tomatoes.  Heart-healthy tub margarine.  Apple.  Milk.  Coffee or tea. Dinner  [employer].  Baked potato.  Broccoli.  Lettuce salad with gluten-free dressing.  Gluten-free bread.  Custard.  Heart-healthy margarine.  Coffee or tea. These meal plans are provided as samples. Your daily meal plans will vary. Document Released: 11/04/2005 Document Revised: 05/05/2012 Document Reviewed: 12/15/2011 [facility] Patient Information 2014 [place]. Diarrhea Diarrhea is frequent loose and watery bowel movements. It can cause you to feel weak and dehydrated. Dehydration can cause you to become tired and thirsty, have a dry mouth, and have decreased urination  that often is dark yellow. Diarrhea is a sign of another problem, most often an infection that will not last long. In most cases, diarrhea typically lasts 2 3 days. However, it can last longer if it is a sign of something more serious. It is important to treat your diarrhea as directed by your caregive to lessen or prevent future episodes of diarrhea. CAUSES  Some common causes include:  Gastrointestinal infections caused by viruses, bacteria, or parasites.  Food poisoning or food allergies.  Certain medicines, such as antibiotics, chemotherapy, and laxatives.  Artificial sweeteners and fructose.  Digestive disorders. HOME CARE INSTRUCTIONS  Ensure adequate fluid intake (hydration): have 1 cup (8 oz) of fluid for each diarrhea episode. Avoid fluids that contain simple sugars or sports drinks, fruit juices, whole milk products, and sodas. Your urine should be clear or pale yellow if you are drinking enough fluids. Hydrate with an oral rehydration solution that you can purchase at pharmacies, retail stores, and online. You can prepare an oral rehydration solution at home by mixing the following ingredients together:    tsp table salt.   tsp baking soda.   tsp salt substitute containing potassium chloride.  1  tablespoons sugar.  1 L (34 oz) of water.  Certain foods and beverages may increase the speed at which food moves through the gastrointestinal (GI) tract. These foods and beverages should be avoided and include:  Caffeinated and alcoholic beverages.  High-fiber foods, such as raw fruits and vegetables, nuts, seeds, and whole grain breads and cereals.  Foods and beverages sweetened with sugar alcohols, such as xylitol, sorbitol, and mannitol.  Some foods may be well tolerated and may help thicken stool including:  Starchy foods, such as rice, toast, pasta, low-sugar cereal, oatmeal, grits, baked potatoes, crackers, and bagels.  Bananas.  Applesauce.  Add probiotic-rich  foods to help increase  healthy bacteria in the GI tract, such as yogurt and fermented milk products.  Wash your hands well after each diarrhea episode.  Only take over-the-counter or prescription medicines as directed by your caregiver.  Take a warm bath to relieve any burning or pain from frequent diarrhea episodes. SEEK IMMEDIATE MEDICAL CARE IF:   You are unable to keep fluids down.  You have persistent vomiting.  You have blood in your stool, or your stools are black and tarry.  You do not urinate in 6 8 hours, or there is only a small amount of very dark urine.  You have abdominal pain that increases or localizes.  You have weakness, dizziness, confusion, or lightheadedness.  You have a severe headache.  Your diarrhea gets worse or does not get better.  You have a fever or persistent symptoms for more than 2 3 days.  You have a fever and your symptoms suddenly get worse. MAKE SURE YOU:   Understand these instructions.  Will watch your condition.  Will get help right away if you are not doing well or get worse. Document Released: 10/25/2002 Document Revised: 10/21/2012 Document Reviewed: 07/12/2012 [facility] Patient Information 2014 [place], [place].

## 2014-03-08 NOTE — ED Provider Notes (Signed)
Medical screening examination/treatment/procedure(s) were performed by non-physician practitioner and as supervising physician I was immediately available for consultation/collaboration.   EKG Interpretation None        [name], MD 03/08/14 [phone]

## 2014-03-08 NOTE — ED Notes (Signed)
MD at bedside. 

## 2014-03-24 ENCOUNTER — Emergency Department (INDEPENDENT_AMBULATORY_CARE_PROVIDER_SITE_OTHER)
Admission: EM | Admit: 2014-03-24 | Discharge: 2014-03-24 | Disposition: A | Discharge status: Home / Self Care | Payer: Self-pay | Source: Home / Self Care | Attending: Family Medicine | Admitting: Family Medicine

## 2014-03-24 ENCOUNTER — Encounter (HOSPITAL_COMMUNITY): Payer: Self-pay | Admitting: Emergency Medicine

## 2014-03-24 DIAGNOSIS — G43909 Migraine, unspecified, not intractable, without status migrainosus: Secondary | ICD-10-CM

## 2014-03-24 MED ORDER — ONDANSETRON 4 MG PO TBDP
4.0000 mg | ORAL_TABLET | Freq: Once | ORAL | Status: AC
  Administered 2014-03-24: 4 mg via ORAL

## 2014-03-24 MED ORDER — RIZATRIPTAN BENZOATE 5 MG PO TABS: 5.0000 mg | ORAL_TABLET | ORAL | Status: DC | PRN

## 2014-03-24 MED ORDER — ONDANSETRON HCL 4 MG PO TABS: 4.0000 mg | ORAL_TABLET | Freq: Three times a day (TID) | ORAL | Status: DC | PRN

## 2014-03-24 MED ORDER — KETOROLAC TROMETHAMINE 30 MG/ML IJ SOLN
INTRAMUSCULAR | Status: AC
  Filled 2014-03-24: qty 1

## 2014-03-24 MED ORDER — ONDANSETRON 4 MG PO TBDP
ORAL_TABLET | ORAL | Status: AC
  Filled 2014-03-24: qty 1

## 2014-03-24 MED ORDER — KETOROLAC TROMETHAMINE 30 MG/ML IJ SOLN
30.0000 mg | Freq: Once | INTRAMUSCULAR | Status: AC
  Administered 2014-03-24: 30 mg via INTRAMUSCULAR

## 2014-03-24 NOTE — Discharge Instructions (Signed)
Migraine Headache A migraine headache is an intense, throbbing pain on one or both sides of your head. A migraine can last for 30 minutes to several hours. CAUSES  The exact cause of a migraine headache is not always known. However, a migraine may be caused when nerves in the brain become irritated and release chemicals that cause inflammation. This causes pain. Certain things may also trigger migraines, such as:  Alcohol.  Smoking.  Stress.  Menstruation.  Aged cheeses.  Foods or drinks that contain nitrates, glutamate, aspartame, or tyramine.  Lack of sleep.  Chocolate.  Caffeine.  Hunger.  Physical exertion.  Fatigue.  Medicines used to treat chest pain (nitroglycerine), birth control pills, estrogen, and some blood pressure medicines. SIGNS AND SYMPTOMS  Pain on one or both sides of your head.  Pulsating or throbbing pain.  Severe pain that prevents daily activities.  Pain that is aggravated by any physical activity.  Nausea, vomiting, or both.  Dizziness.  Pain with exposure to bright lights, loud noises, or activity.  General sensitivity to bright lights, loud noises, or smells. Before you get a migraine, you may get warning signs that a migraine is coming (aura). An aura may include:  Seeing flashing lights.  Seeing bright spots, halos, or zig-zag lines.  Having tunnel vision or blurred vision.  Having feelings of numbness or tingling.  Having trouble talking.  Having muscle weakness. DIAGNOSIS  A migraine headache is often diagnosed based on:  Symptoms.  Physical exam.  A CT scan or MRI of your head. These imaging tests cannot diagnose migraines, but they can help rule out other causes of headaches. TREATMENT Medicines may be given for pain and nausea. Medicines can also be given to help prevent recurrent migraines.  HOME CARE INSTRUCTIONS  Only take over-the-counter or prescription medicines for pain or discomfort as directed by your  health care provider. The use of long-term narcotics is not recommended.  Lie down in a dark, quiet room when you have a migraine.  Keep a journal to find out what may trigger your migraine headaches. For example, write down:  What you eat and drink.  How much sleep you get.  Any change to your diet or medicines.  Limit alcohol consumption.  Quit smoking if you smoke.  Get 7 9 hours of sleep, or as recommended by your health care provider.  Limit stress.  Keep lights dim if bright lights bother you and make your migraines worse. SEEK IMMEDIATE MEDICAL CARE IF:   Your migraine becomes severe.  You have a fever.  You have a stiff neck.  You have vision loss.  You have muscular weakness or loss of muscle control.  You start losing your balance or have trouble walking.  You feel faint or pass out.  You have severe symptoms that are different from your first symptoms. MAKE SURE YOU:   Understand these instructions.  Will watch your condition.  Will get help right away if you are not doing well or get worse. Document Released: 11/04/2005 Document Revised: 08/25/2013 Document Reviewed: 07/12/2013 [employer] Patient Information 2014 [employer].  

## 2014-03-24 NOTE — ED Notes (Signed)
Pt  Reports  A  History  Of  Migraines    With   Nausea     Vomiting    And  Photophobia        X  2  Days      Cannot  Afford  Her  Home  maintance  meds

## 2014-03-24 NOTE — ED Provider Notes (Signed)
CSN: [id number]     Arrival date & time 03/24/14  55 History   First MD Initiated Contact with Patient 03/24/14 1300     Chief Complaint  Patient presents with  . Headache   (Consider location/radiation/quality/duration/timing/severity/associated sxs/prior Treatment) HPI Comments: Patient presents with what she describes as a typical migraine headache that has been unrelieved at home with use of multiple OTC products for pain. States she used to take Zomig and Maxalt for her migraine headaches, but is now without insurance and cannot afford these medications. PCP: Dr. Gwyndolyn Saxon at [facility] on [address] in [place] Headache has been associated with N/V, photophobia, phonophobia and blurred vision in right eye. 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  Smoker   Patient is a 38 y.o. female presenting with headaches. The history is provided by the patient.  Headache Pain location:  Frontal and R temporal Quality: "throbbing" Onset quality:  Gradual Duration:  2 days Timing:  Constant Progression:  Unchanged Associated symptoms: nausea, photophobia and vomiting     Past Medical History  Diagnosis Date  . Migraine   . Kidney stone   . Interstitial cystitis   . Multiple allergies    Past Surgical History  Procedure Laterality Date  . Rhinoplasty    . Bladder augmentation    . Tubal ligation    . Knee surgery     History reviewed. No pertinent family history. History  Substance Use Topics  . Smoking status: Current Every Day Smoker  . Smokeless tobacco: Not on file  . Alcohol Use: No   OB History   Grav Para Term Preterm Abortions TAB SAB Ect Mult Living   5 3             Review of Systems  Constitutional: Negative.   Eyes: Positive for photophobia and visual disturbance.  Respiratory: Negative.   Cardiovascular: Negative.   Gastrointestinal: Positive for nausea and vomiting.  Neurological: Positive for headaches.     Allergies  Amitriptyline; Entex; Etodolac; Flexeril; Lavender oil; and Robaxin  Home Medications   Prior to Admission medications   Medication Sig Start Date End Date Taking? Authorizing Provider  albuterol (PROVENTIL HFA;VENTOLIN HFA) 108 (90 BASE) MCG/ACT inhaler Inhale 1-2 puffs into the lungs every 6 (six) hours as needed for wheezing or shortness of breath.    Historical Provider, MD  aspirin-acetaminophen-caffeine (EXCEDRIN MIGRAINE) [id number] MG per tablet Take 2 tablets by mouth every 4 (four) hours as needed for migraine.    Historical Provider, MD  diphenhydrAMINE (BENADRYL) 25 MG tablet Take 50 mg by mouth 3 (three) times daily.    Historical Provider, MD  EPINEPHrine (EPI-PEN) 0.3 mg/0.3 mL SOAJ injection Inject 0.3 mg into the muscle daily as needed (allergic reaction).    Historical Provider, MD  ibuprofen (ADVIL,MOTRIN) 200 MG tablet Take 600 mg by mouth every 6 (six) hours as needed (fever).    Historical Provider, MD   BP 118/75  Pulse 75  Temp(Src) 98.9 F (37.2 C) (Oral)  Resp 18  SpO2 100% Physical Exam  Nursing note and vitals reviewed. Constitutional: She is oriented to person, place, and time. She appears well-developed and well-nourished. No distress.  HENT:  Head: Normocephalic and atraumatic.  Eyes: Conjunctivae and EOM are normal. Pupils are equal, round, and reactive to light. Right eye exhibits no discharge. Left eye exhibits no discharge. No scleral icterus.  Neck: Normal range of motion, full passive range of motion without pain  and phonation normal. Neck supple. No thyromegaly present.  Cardiovascular: Normal rate, regular rhythm and normal heart sounds.   Pulmonary/Chest: Effort normal and breath sounds normal.  Abdominal: Soft. Bowel sounds are normal. She exhibits no distension. There is no tenderness.  Musculoskeletal: Normal range of motion.  Lymphadenopathy:    She has no cervical adenopathy.  Neurological: She is alert and oriented to  person, place, and time. No cranial nerve deficit.  Skin: Skin is warm and dry.  Psychiatric: She has a normal mood and affect. Her behavior is normal.    ED Course  Procedures (including critical care time) Labs Review Labs Reviewed - No data to display  Imaging Review No results found.   MDM   1. Migraine   Some relief following toradol and zofran. Patient states she wells well enough to drive home to rest. We discussed that maxalt was now available in generic form and she welcomed the idea of having Rx for this medication for home. Will also provide Rx for zofran to help with nausea. Patient plans to follow up with her PCP.    [place], [place] 03/24/14 [phone]

## 2014-03-25 NOTE — ED Provider Notes (Signed)
Medical screening examination/treatment/procedure(s) were performed by a resident physician or non-physician practitioner and as the supervising physician I was immediately available for consultation/collaboration.  Lynne Leader, MD    Gregor Hams, MD 03/25/14 [phone]

## 2014-04-10 ENCOUNTER — Emergency Department (HOSPITAL_COMMUNITY)
Admission: EM | Admit: 2014-04-10 | Discharge: 2014-04-11 | Disposition: A | Discharge status: Home / Self Care | Payer: Self-pay | Attending: Emergency Medicine | Admitting: Emergency Medicine

## 2014-04-10 ENCOUNTER — Encounter (HOSPITAL_COMMUNITY): Payer: Self-pay | Admitting: Emergency Medicine

## 2014-04-10 DIAGNOSIS — T782XXA Anaphylactic shock, unspecified, initial encounter: Secondary | ICD-10-CM

## 2014-04-10 DIAGNOSIS — R059 Cough, unspecified: Secondary | ICD-10-CM | POA: Insufficient documentation

## 2014-04-10 DIAGNOSIS — R0602 Shortness of breath: Secondary | ICD-10-CM | POA: Insufficient documentation

## 2014-04-10 DIAGNOSIS — G43909 Migraine, unspecified, not intractable, without status migrainosus: Secondary | ICD-10-CM | POA: Insufficient documentation

## 2014-04-10 DIAGNOSIS — Z79899 Other long term (current) drug therapy: Secondary | ICD-10-CM | POA: Insufficient documentation

## 2014-04-10 DIAGNOSIS — Z8742 Personal history of other diseases of the female genital tract: Secondary | ICD-10-CM | POA: Insufficient documentation

## 2014-04-10 DIAGNOSIS — I10 Essential (primary) hypertension: Secondary | ICD-10-CM | POA: Insufficient documentation

## 2014-04-10 DIAGNOSIS — R05 Cough: Secondary | ICD-10-CM | POA: Insufficient documentation

## 2014-04-10 DIAGNOSIS — Z87442 Personal history of urinary calculi: Secondary | ICD-10-CM | POA: Insufficient documentation

## 2014-04-10 DIAGNOSIS — F17200 Nicotine dependence, unspecified, uncomplicated: Secondary | ICD-10-CM | POA: Insufficient documentation

## 2014-04-10 MED ORDER — METHYLPREDNISOLONE SODIUM SUCC 125 MG IJ SOLR
125.0000 mg | Freq: Once | INTRAMUSCULAR | Status: AC
  Administered 2014-04-11: 125 mg via INTRAVENOUS

## 2014-04-10 NOTE — ED Notes (Signed)
Pt given by EMS: 0.3 Epinephrine 50 mg Benadryl  5 mg Albuterol

## 2014-04-10 NOTE — ED Notes (Signed)
Bed: WA04 Expected date:  Expected time:  Means of arrival:  Comments: EMS allergic reaction 

## 2014-04-11 MED ORDER — EPINEPHRINE 0.3 MG/0.3ML IJ SOAJ: 0.3000 mg | INTRAMUSCULAR | Status: DC | PRN

## 2014-04-11 MED ORDER — SODIUM CHLORIDE 0.9 % IV BOLUS (SEPSIS)
1000.0000 mL | Freq: Once | INTRAVENOUS | Status: AC
  Administered 2014-04-11: 1000 mL via INTRAVENOUS

## 2014-04-11 NOTE — Discharge Instructions (Signed)
Anaphylactic Reaction °An anaphylactic reaction is a sudden, severe allergic reaction that involves the whole body. It can be life threatening. A hospital stay is often required. People with asthma, eczema, or hay fever are slightly more likely to have an anaphylactic reaction. °CAUSES  °An anaphylactic reaction may be caused by anything to which you are allergic. After being exposed to the allergic substance, your immune system becomes sensitized to it. When you are exposed to that allergic substance again, an allergic reaction can occur. Common causes of an anaphylactic reaction include: °· Medicines. °· Foods, especially peanuts, wheat, shellfish, milk, and eggs. °· Insect bites or stings. °· Blood products. °· Chemicals, such as dyes, latex, and contrast material used for imaging tests. °SYMPTOMS  °When an allergic reaction occurs, the body releases histamine and other substances. These substances cause symptoms such as tightening of the airway. Symptoms often develop within seconds or minutes of exposure. Symptoms may include: °· Skin rash or hives. °· Itching. °· Chest tightness. °· Swelling of the eyes, tongue, or lips. °· Trouble breathing or swallowing. °· Lightheadedness or fainting. °· Anxiety or confusion. °· Stomach pains, vomiting, or diarrhea. °· Nasal congestion. °· A fast or irregular heartbeat (palpitations). °DIAGNOSIS  °Diagnosis is based on your history of recent exposure to allergic substances, your symptoms, and a physical exam. Your caregiver may also perform blood or urine tests to confirm the diagnosis. °TREATMENT  °Epinephrine medicine is the main treatment for an anaphylactic reaction. Other medicines that may be used for treatment include antihistamines, steroids, and albuterol. In severe cases, fluids and medicine to support blood pressure may be given through an intravenous line (IV). Even if you improve after treatment, you need to be observed to make sure your condition does not get  worse. This may require a stay in the hospital. °HOME CARE INSTRUCTIONS  °· Wear a medical alert bracelet or necklace stating your allergy. °· You and your family must learn how to use an anaphylaxis kit or give an epinephrine injection to temporarily treat an emergency allergic reaction. Always carry your epinephrine injection or anaphylaxis kit with you. This can be lifesaving if you have a severe reaction. °· Do not drive or perform tasks after treatment until the medicines used to treat your reaction have worn off, or until your caregiver says it is okay. °· If you have hives or a rash: °· Take medicines as directed by your caregiver. °· You may use an over-the-counter antihistamine (diphenhydramine) as needed. °· Apply cold compresses to the skin or take baths in cool water. Avoid hot baths or showers. °SEEK MEDICAL CARE IF:  °· You develop symptoms of an allergic reaction to a new substance. Symptoms may start right away or minutes later. °· You develop a rash, hives, or itching. °· You develop new symptoms. °SEEK IMMEDIATE MEDICAL CARE IF:  °· You have swelling of the mouth, difficulty breathing, or wheezing. °· You have a tight feeling in your chest or throat. °· You develop hives, swelling, or itching all over your body. °· You develop severe vomiting or diarrhea. °· You feel faint or pass out. °This is an emergency. Use your epinephrine injection or anaphylaxis kit as you have been instructed. Call your local emergency services (911 in [place] Even if you improve after the injection, you need to be examined at a hospital emergency department. °MAKE SURE YOU:  °· Understand these instructions. °· Will watch your condition. °· Will get help right away if you are not   doing well or get worse. Document Released: 11/04/2005 Document Revised: 05/05/2012 Document Reviewed: 02/05/2012 [facility] Patient Information 2014 [place], [place].

## 2014-04-11 NOTE — ED Provider Notes (Signed)
CSN: [id number]     Arrival date & time 04/10/14  2313 History   First MD Initiated Contact with Patient 04/10/14 2337     Chief Complaint  Patient presents with  . Allergic Reaction     (Consider location/radiation/quality/duration/timing/severity/associated sxs/prior Treatment) HPI Comments: 38 year old female with asthma, interstitial cystitis, smoking, anaphylaxis, multiple allergies presents after coughing and shortness breath episode following lavender exposure. This is similar previous. Patient gave EpiPen after breathing issues similar to previous. Patient gradually improved since and has had Benadryl for which he takes daily. No other new exposures known. No syncope. Symptoms started prior to arrival.  Patient is a 38 y.o. female presenting with allergic reaction. The history is provided by the patient.  Allergic Reaction   Past Medical History  Diagnosis Date  . Migraine   . Kidney stone   . Interstitial cystitis   . Multiple allergies    Past Surgical History  Procedure Laterality Date  . Rhinoplasty    . Bladder augmentation    . Tubal ligation    . Knee surgery     History reviewed. No pertinent family history. History  Substance Use Topics  . Smoking status: Current Every Day Smoker  . Smokeless tobacco: Not on file  . Alcohol Use: No   OB History   Grav Para Term Preterm Abortions TAB SAB Ect Mult Living   5 3             Review of Systems  Constitutional: Negative for fever and chills.  HENT: Negative for congestion.   Eyes: Negative for visual disturbance.  Respiratory: Positive for cough and shortness of breath.   Cardiovascular: Negative for chest pain.  Gastrointestinal: Negative for vomiting and abdominal pain.  Genitourinary: Negative for dysuria and flank pain.  Musculoskeletal: Negative for neck pain and neck stiffness.  Neurological: Negative for light-headedness and headaches.      Allergies  Amitriptyline; Bee venom; Entex; Etodolac;  Flexeril; Iodine; Lavender oil; Mushroom extract complex; Robaxin; and Shellfish allergy  Home Medications   Prior to Admission medications   Medication Sig Start Date End Date Taking? Authorizing Provider  albuterol (PROVENTIL HFA;VENTOLIN HFA) 108 (90 BASE) MCG/ACT inhaler Inhale 1-2 puffs into the lungs every 6 (six) hours as needed for wheezing or shortness of breath.   Yes Historical Provider, MD  aspirin-acetaminophen-caffeine (EXCEDRIN MIGRAINE) [id number] MG per tablet Take 2 tablets by mouth every 4 (four) hours as needed for migraine.   Yes Historical Provider, MD  diphenhydrAMINE (BENADRYL) 25 MG tablet Take 50 mg by mouth 3 (three) times daily.   Yes Historical Provider, MD  EPINEPHrine (EPI-PEN) 0.3 mg/0.3 mL SOAJ injection Inject 0.3 mg into the muscle daily as needed (allergic reaction).   Yes Historical Provider, MD  rizatriptan (MAXALT) 5 MG tablet Take 1 tablet (5 mg total) by mouth as needed for migraine. May repeat in 2 hours if needed 03/24/14  Yes Annett Gula Presson, PA   BP 137/78  Pulse 103  Temp(Src) 98.2 F (36.8 C) (Oral)  Resp 20  SpO2 98% Physical Exam  Nursing note and vitals reviewed. Constitutional: She is oriented to person, place, and time. She appears well-developed and well-nourished.  HENT:  Head: Normocephalic and atraumatic.  Eyes: Conjunctivae are normal. Right eye exhibits no discharge. Left eye exhibits no discharge.  Neck: Normal range of motion. Neck supple. No tracheal deviation present.  No angioedema  Cardiovascular: Normal rate and regular rhythm.   Pulmonary/Chest: Effort normal and breath sounds normal.  Abdominal: Soft. She exhibits no distension. There is no tenderness. There is no guarding.  Musculoskeletal: She exhibits no edema.  Neurological: She is alert and oriented to person, place, and time.  Skin: Skin is warm. No rash noted.  Psychiatric: She has a normal mood and affect.    ED Course  Procedures (including critical  care time) Labs Review Labs Reviewed - No data to display  Imaging Review No results found.   EKG Interpretation None      MDM   Final diagnoses:  Anaphylaxis   Patient received epi Benadryl and albuterol prior to arrival and symptoms improving. Plan for IV fluids, Solu-Medrol and observation ED. Plan for sending home with EpiPen as patient is out.  Patient observed in the ER and all symptoms resolved. Vitals improved. Patient comfortable going home and return reasons discussed. Results and differential diagnosis were discussed with the patient/parent/guardian. Close follow up outpatient was discussed, comfortable with the plan.   Filed Vitals:   04/10/14 2313 04/10/14 2317 04/11/14 0130  BP: 126/79 137/78   Pulse: 123 103 81  Temp:  98.2 F (36.8 C)   TempSrc:  Oral   Resp: 22 20 17   SpO2: 99% 98% 97%      Anaphylaxis   Mariea Clonts, MD 04/11/14 [phone]

## 2014-04-19 ENCOUNTER — Encounter (HOSPITAL_COMMUNITY): Payer: Self-pay | Admitting: Emergency Medicine

## 2014-04-19 ENCOUNTER — Emergency Department (HOSPITAL_COMMUNITY)
Admission: EM | Admit: 2014-04-19 | Discharge: 2014-04-19 | Disposition: A | Discharge status: Home / Self Care | Payer: Self-pay | Attending: Emergency Medicine | Admitting: Emergency Medicine

## 2014-04-19 ENCOUNTER — Emergency Department (HOSPITAL_COMMUNITY): Payer: Self-pay

## 2014-04-19 DIAGNOSIS — G43909 Migraine, unspecified, not intractable, without status migrainosus: Secondary | ICD-10-CM | POA: Insufficient documentation

## 2014-04-19 DIAGNOSIS — R0602 Shortness of breath: Secondary | ICD-10-CM | POA: Insufficient documentation

## 2014-04-19 DIAGNOSIS — Z8719 Personal history of other diseases of the digestive system: Secondary | ICD-10-CM | POA: Insufficient documentation

## 2014-04-19 DIAGNOSIS — Z79899 Other long term (current) drug therapy: Secondary | ICD-10-CM | POA: Insufficient documentation

## 2014-04-19 DIAGNOSIS — Z87442 Personal history of urinary calculi: Secondary | ICD-10-CM | POA: Insufficient documentation

## 2014-04-19 DIAGNOSIS — R079 Chest pain, unspecified: Secondary | ICD-10-CM | POA: Insufficient documentation

## 2014-04-19 DIAGNOSIS — F17200 Nicotine dependence, unspecified, uncomplicated: Secondary | ICD-10-CM | POA: Insufficient documentation

## 2014-04-19 MED ORDER — ALBUTEROL SULFATE (2.5 MG/3ML) 0.083% IN NEBU
5.0000 mg | INHALATION_SOLUTION | Freq: Once | RESPIRATORY_TRACT | Status: AC
  Administered 2014-04-19: 5 mg via RESPIRATORY_TRACT
  Filled 2014-04-19: qty 6

## 2014-04-19 MED ORDER — BENZONATATE 100 MG PO CAPS: 100.0000 mg | ORAL_CAPSULE | Freq: Three times a day (TID) | ORAL | Status: DC

## 2014-04-19 MED ORDER — PREDNISONE 20 MG PO TABS
40.0000 mg | ORAL_TABLET | Freq: Once | ORAL | Status: AC
  Administered 2014-04-19: 40 mg via ORAL
  Filled 2014-04-19: qty 2

## 2014-04-19 MED ORDER — ALBUTEROL SULFATE HFA 108 (90 BASE) MCG/ACT IN AERS: 2.0000 | INHALATION_SPRAY | RESPIRATORY_TRACT | Status: DC | PRN

## 2014-04-19 MED ORDER — ALBUTEROL SULFATE HFA 108 (90 BASE) MCG/ACT IN AERS
2.0000 | INHALATION_SPRAY | Freq: Once | RESPIRATORY_TRACT | Status: DC
Start: 2014-04-19 — End: 2014-04-19
  Filled 2014-04-19: qty 6.7

## 2014-04-19 MED ORDER — PREDNISONE 20 MG PO TABS: 40.0000 mg | ORAL_TABLET | Freq: Every day | ORAL | Status: DC

## 2014-04-19 NOTE — ED Provider Notes (Signed)
CSN: [id number]     Arrival date & time 04/19/14  2025 History   First MD Initiated Contact with Patient 04/19/14 [phone]     Chief Complaint  Patient presents with  . Shortness of Breath  . Chest Pain     (Consider location/radiation/quality/duration/timing/severity/associated sxs/prior Treatment) HPI Comments: 38 year old female, history of anaphylactic shock to [place]. She states that 9 days ago she was exposed to [employer], she developed anaphylactic shock required EpiPen x2, was treated in the emergency department and released. She had stable vital signs. Since that time she has had ongoing shortness of breath with a cough, she feels as though she has phlegm rattling in her chest and cannot get it up, she denies any swelling of her lips tongue or face and denies any rashes. This evening she states that she has had increased shortness of breath, chest tightness and a cough. No fevers chills nausea or vomiting. She has not been on prednisone this week.  Patient is a 38 y.o. female presenting with shortness of breath and chest pain. The history is provided by the patient and medical records.  Shortness of Breath Associated symptoms: chest pain   Chest Pain Associated symptoms: shortness of breath     Past Medical History  Diagnosis Date  . Migraine   . Kidney stone   . Interstitial cystitis   . Multiple allergies    Past Surgical History  Procedure Laterality Date  . Rhinoplasty    . Bladder augmentation    . Tubal ligation    . Knee surgery    . Tonsillectomy    . Cholecystectomy     No family history on file. History  Substance Use Topics  . Smoking status: Current Every Day Smoker  . Smokeless tobacco: Not on file  . Alcohol Use: No   OB History   Grav Para Term Preterm Abortions TAB SAB Ect Mult Living   5 3             Review of Systems  Respiratory: Positive for shortness of breath.   Cardiovascular: Positive for chest pain.  All other systems reviewed and are  negative.     Allergies  Bee venom; Lavender oil; Amitriptyline; Entex; Etodolac; Flexeril; Iodine; Mushroom extract complex; Robaxin; and Shellfish allergy  Home Medications   Prior to Admission medications   Medication Sig Start Date End Date Taking? Authorizing Provider  albuterol (PROVENTIL HFA;VENTOLIN HFA) 108 (90 BASE) MCG/ACT inhaler Inhale 1-2 puffs into the lungs every 6 (six) hours as needed for wheezing or shortness of breath.   Yes Historical Provider, MD  aspirin-acetaminophen-caffeine (EXCEDRIN MIGRAINE) [id number] MG per tablet Take 2 tablets by mouth every 4 (four) hours as needed for migraine.   Yes Historical Provider, MD  diphenhydrAMINE (BENADRYL) 25 MG tablet Take 50 mg by mouth 4 (four) times daily.    Yes Historical Provider, MD  EPINEPHrine (EPI-PEN) 0.3 mg/0.3 mL SOAJ injection Inject 0.3 mg into the muscle daily as needed (allergic reaction).   Yes Historical Provider, MD  rizatriptan (MAXALT) 5 MG tablet Take 1 tablet (5 mg total) by mouth as needed for migraine. May repeat in 2 hours if needed 03/24/14  Yes Lahoma Rocker, PA  albuterol (PROVENTIL HFA;VENTOLIN HFA) 108 (90 BASE) MCG/ACT inhaler Inhale 2 puffs into the lungs every 4 (four) hours as needed for wheezing or shortness of breath. 04/19/14   Johnna Acosta, MD  benzonatate (TESSALON) 100 MG capsule Take 1 capsule (100 mg total) by mouth  every 8 (eight) hours. 04/19/14   Johnna Acosta, MD  predniSONE (DELTASONE) 20 MG tablet Take 2 tablets (40 mg total) by mouth daily. 04/19/14   Johnna Acosta, MD   BP 114/61  Pulse 94  Temp(Src) 98.1 F (36.7 C) (Oral)  Resp 19  SpO2 98% Physical Exam  Nursing note and vitals reviewed. Constitutional: She appears well-developed and well-nourished. No distress.  HENT:  Head: Normocephalic and atraumatic.  Mouth/Throat: Oropharynx is clear and moist. No oropharyngeal exudate.  Eyes: Conjunctivae and EOM are normal. Pupils are equal, round, and reactive to light.  Right eye exhibits no discharge. Left eye exhibits no discharge. No scleral icterus.  Neck: Normal range of motion. Neck supple. No JVD present. No thyromegaly present.  Cardiovascular: Normal rate, regular rhythm, normal heart sounds and intact distal pulses.  Exam reveals no gallop and no friction rub.   No murmur heard. Pulmonary/Chest: Effort normal and breath sounds normal. No respiratory distress. She has no wheezes. She has no rales.  Abdominal: Soft. Bowel sounds are normal. She exhibits no distension and no mass. There is no tenderness.  Musculoskeletal: Normal range of motion. She exhibits no edema and no tenderness.  Lymphadenopathy:    She has no cervical adenopathy.  Neurological: She is alert. Coordination normal.  Skin: Skin is warm and dry. No rash noted. No erythema.  Psychiatric: She has a normal mood and affect. Her behavior is normal.    ED Course  Procedures (including critical care time) Labs Review Labs Reviewed - No data to display  Imaging Review Dg Chest 2 View  04/19/2014   CLINICAL DATA:  Increasing shortness of breath, cough and chest tightness.  EXAM: CHEST  2 VIEW  COMPARISON:  Chest radiograph December 28, 2013  FINDINGS: Cardiomediastinal silhouette is unremarkable. The lungs are clear without pleural effusions or focal consolidations. Trachea projects midline and there is no pneumothorax. Soft tissue planes and included osseous structures are non-suspicious. Remote left anterior rib fractures. Surgical clips in the abdomen may reflect cholecystectomy.  IMPRESSION: No acute cardiopulmonary process.   Electronically Signed   By: Elon Alas   On: 04/19/2014 06:40     EKG Interpretation   Date/Time:  Tuesday April 19 2014 05:17:16 EDT Ventricular Rate:  104 PR Interval:  130 QRS Duration: 92 QT Interval:  342 QTC Calculation: 449 R Axis:   82 Text Interpretation:  Sinus tachycardia Otherwise normal ECG since last  tracing no significant change  Confirmed by Maebel Marasco  MD, [place] ([id number]) on  04/19/2014 5:21:26 AM      MDM   Final diagnoses:  Shortness of breath    The patient has occasional coughing spells, her lungs are clear without wheezing, oropharynx is clear and moist, no swelling of the posterior pharynx, uvula or soft palate. Abdomen is soft, no peripheral edema or rashes. We'll do a chest x-ray, prednisone and give albuterol treatments though at this time the patient does not appear acutely ill. Her symptoms have been ongoing for 8 days.  Medications the patient is much much improved and states she has no symptoms. She will be discharged home with albuterol MDI and hand, prednisone and a cough suppressant. The patient is agreeable to this plan.  Meds given in ED:  Medications  albuterol (PROVENTIL HFA;VENTOLIN HFA) 108 (90 BASE) MCG/ACT inhaler 2 puff (not administered)  predniSONE (DELTASONE) tablet 40 mg (40 mg Oral Given 04/19/14 0546)  albuterol (PROVENTIL) (2.5 MG/3ML) 0.083% nebulizer solution 5 mg (5 mg Nebulization  Given 04/19/14 0545)    New Prescriptions   ALBUTEROL (PROVENTIL HFA;VENTOLIN HFA) 108 (90 BASE) MCG/ACT INHALER    Inhale 2 puffs into the lungs every 4 (four) hours as needed for wheezing or shortness of breath.   BENZONATATE (TESSALON) 100 MG CAPSULE    Take 1 capsule (100 mg total) by mouth every 8 (eight) hours.   PREDNISONE (DELTASONE) 20 MG TABLET    Take 2 tablets (40 mg total) by mouth daily.      Johnna Acosta, MD 04/19/14 [phone]

## 2014-04-19 NOTE — ED Notes (Signed)
MD at bedside. 

## 2014-04-19 NOTE — ED Notes (Signed)
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  Pt her with increasing sob, cough, and chest tightness.  Pt states throat is raw.

## 2014-04-19 NOTE — Discharge Instructions (Signed)
Please call your doctor for a followup appointment within 24-48 hours. When you talk to your doctor please let them know that you were seen in the emergency department and have them acquire all of your records so that they can discuss the findings with you and formulate a treatment plan to fully care for your new and ongoing problems.  Use the prednisone once daily for shortness of breath, albuterol inhaler 2 puffs every 4 hours for 24 hours.

## 2014-05-20 ENCOUNTER — Emergency Department (INDEPENDENT_AMBULATORY_CARE_PROVIDER_SITE_OTHER)
Admission: EM | Admit: 2014-05-20 | Discharge: 2014-05-20 | Disposition: A | Discharge status: Home / Self Care | Payer: Self-pay | Source: Home / Self Care | Attending: Emergency Medicine | Admitting: Emergency Medicine

## 2014-05-20 ENCOUNTER — Encounter (HOSPITAL_COMMUNITY): Payer: Self-pay | Admitting: Emergency Medicine

## 2014-05-20 ENCOUNTER — Emergency Department (INDEPENDENT_AMBULATORY_CARE_PROVIDER_SITE_OTHER): Payer: Self-pay

## 2014-05-20 DIAGNOSIS — S8392XA Sprain of unspecified site of left knee, initial encounter: Secondary | ICD-10-CM

## 2014-05-20 DIAGNOSIS — IMO0002 Reserved for concepts with insufficient information to code with codable children: Secondary | ICD-10-CM

## 2014-05-20 DIAGNOSIS — J4530 Mild persistent asthma, uncomplicated: Secondary | ICD-10-CM

## 2014-05-20 MED ORDER — HYDROCODONE-ACETAMINOPHEN 5-325 MG PO TABS: ORAL_TABLET | ORAL | Status: DC

## 2014-05-20 MED ORDER — MELOXICAM 15 MG PO TABS: 15.0000 mg | ORAL_TABLET | Freq: Every day | ORAL | Status: DC

## 2014-05-20 MED ORDER — ALBUTEROL SULFATE HFA 108 (90 BASE) MCG/ACT IN AERS: 1.0000 | INHALATION_SPRAY | Freq: Four times a day (QID) | RESPIRATORY_TRACT | Status: DC | PRN

## 2014-05-20 MED ORDER — BECLOMETHASONE DIPROPIONATE 80 MCG/ACT IN AERS: 2.0000 | INHALATION_SPRAY | Freq: Two times a day (BID) | RESPIRATORY_TRACT | Status: DC

## 2014-05-20 MED ORDER — PREDNISONE 10 MG PO TABS: ORAL_TABLET | ORAL | Status: DC

## 2014-05-20 NOTE — ED Provider Notes (Signed)
Chief Complaint   Chief Complaint  Patient presents with  . Knee Injury    History of Present Illness   Brandi Morris is a 38 year old female who has slipped multiple times in grease while at work. The most recent time was this past Monday, 5 days ago. These injuries have occurred over the course of a month. Her left knee is swollen and painful. It pops and catches. She has difficulty straightening the knee. Is sometimes grinds. She denies any giving way of the joint. There is no swelling. She arthroscopic surgery done by Dr. Clydell Hakim when she was 24. This is not a workers comp injury.  She also wants refills on her asthma medicine. Her asthma has been worse recently and she's had to use her rescue inhaler multiple times per day.  Review of Systems   Other than as noted above, the patient denies any of the following symptoms: Systemic:  No fevers or chills. Musculoskeletal:  No arthritis, swelling, or joint pain.  Neurological:  No muscular weakness or paresthesias.  [place]   Past medical history, family history, social history, meds, and allergies were reviewed.     Physical Examination   Vital signs:  BP 119/82  Pulse 91  Temp(Src) 98.2 F (36.8 C) (Oral)  Resp 16  SpO2 99% Gen:  Alert and oriented times 3.  In no distress. Lungs: Clear to auscultation. Musculoskeletal: She has pain to palpation of the medial and lateral joint lines. The knee has a full range of motion with no crepitus. She's able to fully flex but lacks about 5 of full extension.   McMurray's test was negative.  Lachman's test was negative.  Anterior drawer test was negative.   Varus and valgus stress result in pain but no ligamentous laxity.  Otherwise, all joints had a full a ROM with no swelling, bruising or deformity.  No edema, pulses full. Extremities were warm and pink.  Capillary refill was brisk.  Skin:  Clear, warm and dry.  No rash. Neuro:  Alert and oriented times 3.  Muscle strength was  normal.  Sensation was intact to light touch.   Radiology   Dg Knee Complete 4 Views Left  05/20/2014   CLINICAL DATA:  [place].  Left knee pain.  EXAM: LEFT KNEE - COMPLETE 4+ VIEW  COMPARISON:  None.  FINDINGS: The joint spaces are maintained. No acute fractures identified. No osteochondral abnormality. No joint effusion.  IMPRESSION: No acute fracture.   Electronically Signed   By: Kalman Jewels M.D.   On: 05/20/2014 11:12   I reviewed the images independently and personally and concur with the radiologist's findings.    Course in Urgent [place]   Patient was placed in a knee sleeve.  Assessment   The primary encounter diagnosis was Knee sprain, left, initial encounter. A diagnosis of Asthma, mild persistent, uncomplicated was also pertinent to this visit.  Plan    1.  Meds:  The following meds were prescribed:   Discharge Medication List as of 05/20/2014 11:28 AM    START taking these medications   Details  !! albuterol (PROVENTIL HFA;VENTOLIN HFA) 108 (90 BASE) MCG/ACT inhaler Inhale 1-2 puffs into the lungs every 6 (six) hours as needed for wheezing or shortness of breath., Starting 05/20/2014, Until Discontinued, Normal    beclomethasone (QVAR) 80 MCG/ACT inhaler Inhale 2 puffs into the lungs 2 (two) times daily., Starting 05/20/2014, Until Discontinued, Normal    HYDROcodone-acetaminophen (NORCO/VICODIN) 5-325 MG per tablet 1 to  2 tabs every 4 to 6 hours as needed for pain., Print    meloxicam (MOBIC) 15 MG tablet Take 1 tablet (15 mg total) by mouth daily., Starting 05/20/2014, Until Discontinued, Normal    !! predniSONE (DELTASONE) 10 MG tablet Take 4 tabs daily for 4 days, 3 tabs daily for 4 days, 2 tabs daily for 4 days, then 1 tab daily for 4 days., Normal     !! - Potential duplicate medications found. Please discuss with provider.      2.  Patient Education/Counseling:  The patient was given appropriate handouts, self care instructions, and instructed in symptomatic  relief, including rest and activity, elevation, application of ice and compression.  Given the exercises and followup with orthopedics in one week. Also needs followup with her primary care physician for her asthma.  3.  Follow up:  The patient was told to follow up here if no better in 3 to 4 days, or sooner if becoming worse in any way, and given some red flag symptoms such as worsening pain or neurological symptoms which would prompt immediate return.       Harden Mo, MD 05/20/14 2118

## 2014-05-20 NOTE — Discharge Instructions (Signed)
Knee pain can be caused by many conditions:  Osteoarthritis, gout, bursitis, tendonitis, cartiledge damage, condromalacia patella, patellofemoral syndrome, and ligament sprain to name just a few.  Often some simple conservative measures can help alleviate the pain.  Do not do the following:  Avoid squatting and doing deep knee bends.  This puts too much of load on your cartiledges and tendons.  If you do a knee bend, go only half way down, flexing your knee no more than 90 degrees.  Do the following:  If you are overweight or obese, lose weight.  This makes for a lot less load on your knee joints.  If you use tobacco, quit.  Nicotine causes spasm of the small arteries, decreases blood flow, and impairs your body's normal ability to repair damage.  If your knee is acutely inflamed, use the principles of RICE (rest, ice, compression, and elevation).  Wearing a knee brace can help.  These are usually made of neoprene and can be purchased over the counter at the drug store.  Use of over the counter pain meds can be of help.  Tylenol (or acetaminophen) is the safest to use.  It often helps to take this regularly.  You can take up to 2 325 mg tablets 5 times daily, but it best to start out much lower that that, perhaps 2 325 mg tablets twice daily, then increase from there. People who are on the blood thinner warfarin have to be careful about taking high doses of Tylenol.  For people who are able to tolerate them, ibuprofen and naproxyn can also help with the pain.  You should discuss these agents with your physician before taking them.  People with chronic kidney disease, hypertension, peptic ulcer disease, and reflux can suffer adverse side effects. They should not be taken with warfarin. The maximum dosage of ibuprofen is 800 mg 3 times daily with meals.  The maximum dosage of naprosyn is 2 and 1/2 tablets twice daily with food, but again, start out low and gradually increase the dose until adequate  pain relief is achieved. Ibuprofen and naprosyn should always be taken with food.  People with cartiledge injury or osteoarthritis may find glucosamine to be helpful.  This is an over-the-counter supplement that helps nourish and repair cartiledge.  The dose is 500 mg 3 times daily or 1500 mg taken in a single dose. This can take several months to work and it doesn't always work.    For people with knee pain on just one side, use of a cane held in the hand on the same side as the knee pain takes some of the stress off the knee joint and can make a big difference in knee pain.  Wearing good shoes with adequate arch support is essential.  Regular exercise is of utmost importance.  Swimming, water aerobics, or use of an elliptical exerciser put the least stress on the knees of any exercise.  Finally doing the exercises below can be very helpful.  They tend to strengthen the muscles around the knee and provide extra support and stability.  Try to do them twice a day followed by ice for 10 minutes.

## 2014-05-20 NOTE — ED Notes (Signed)
C/o left knee injury due to slipping at work Unable to straighten leg  States knee was swollen  Did ice, elevate and use ibuprofen

## 2014-06-11 ENCOUNTER — Emergency Department (HOSPITAL_COMMUNITY)
Admission: EM | Admit: 2014-06-11 | Discharge: 2014-06-11 | Disposition: A | Discharge status: Home / Self Care | Payer: Self-pay | Attending: Emergency Medicine | Admitting: Emergency Medicine

## 2014-06-11 ENCOUNTER — Encounter (HOSPITAL_COMMUNITY): Payer: Self-pay | Admitting: Emergency Medicine

## 2014-06-11 DIAGNOSIS — Z792 Long term (current) use of antibiotics: Secondary | ICD-10-CM | POA: Insufficient documentation

## 2014-06-11 DIAGNOSIS — R05 Cough: Secondary | ICD-10-CM | POA: Insufficient documentation

## 2014-06-11 DIAGNOSIS — Z79899 Other long term (current) drug therapy: Secondary | ICD-10-CM | POA: Insufficient documentation

## 2014-06-11 DIAGNOSIS — R6889 Other general symptoms and signs: Secondary | ICD-10-CM | POA: Insufficient documentation

## 2014-06-11 DIAGNOSIS — T7840XA Allergy, unspecified, initial encounter: Secondary | ICD-10-CM

## 2014-06-11 DIAGNOSIS — R062 Wheezing: Secondary | ICD-10-CM | POA: Insufficient documentation

## 2014-06-11 DIAGNOSIS — Z87442 Personal history of urinary calculi: Secondary | ICD-10-CM | POA: Insufficient documentation

## 2014-06-11 DIAGNOSIS — Z8719 Personal history of other diseases of the digestive system: Secondary | ICD-10-CM | POA: Insufficient documentation

## 2014-06-11 DIAGNOSIS — T50995A Adverse effect of other drugs, medicaments and biological substances, initial encounter: Secondary | ICD-10-CM | POA: Insufficient documentation

## 2014-06-11 DIAGNOSIS — F17200 Nicotine dependence, unspecified, uncomplicated: Secondary | ICD-10-CM | POA: Insufficient documentation

## 2014-06-11 DIAGNOSIS — G43909 Migraine, unspecified, not intractable, without status migrainosus: Secondary | ICD-10-CM | POA: Insufficient documentation

## 2014-06-11 DIAGNOSIS — R059 Cough, unspecified: Secondary | ICD-10-CM | POA: Insufficient documentation

## 2014-06-11 DIAGNOSIS — R0602 Shortness of breath: Secondary | ICD-10-CM | POA: Insufficient documentation

## 2014-06-11 MED ORDER — EPINEPHRINE 0.3 MG/0.3ML IJ SOAJ: 0.3000 mg | Freq: Every day | INTRAMUSCULAR | Status: DC | PRN

## 2014-06-11 NOTE — Discharge Instructions (Signed)

## 2014-06-11 NOTE — ED Provider Notes (Signed)
CSN: [id number]     Arrival date & time 06/11/14  0251 History   First MD Initiated Contact with Patient 06/11/14 [phone]     Chief Complaint  Patient presents with  . 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 Patient presents to the emergency department by EMS this evening after an anaphylactic reaction to the smell of white musk.  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 5 minutes later patient noticed that she started coughing. She used her albuterol inhaler at this time with no relief. Patient then developed difficulty breathing, wheezing, and states that she was "choking in white foam". Patient administered her EpiPen at [place] with improvement in her symptoms. She was also given 50 mg Benadryl by EMS prior to arrival. Patient has had improvement in her symptoms since this time. At present, patient states "I feel fine". She denies syncope, fever, lip swelling, tongue swelling, N/V, and rashes.  Patient is a 38 y.o. female presenting with allergic reaction. The history is provided by the patient. No language interpreter was used.  Allergic Reaction Presenting symptoms: wheezing     Past Medical History  Diagnosis Date  . Migraine   . Kidney stone   . Interstitial cystitis   . Multiple allergies    Past Surgical History  Procedure Laterality Date  . Rhinoplasty    . Bladder augmentation    . Tubal ligation    . Knee surgery    . Tonsillectomy    . Cholecystectomy     History reviewed. No pertinent family history. History  Substance Use Topics  . Smoking status: Current Every Day Smoker  . Smokeless tobacco: Not on file  . Alcohol Use: No   OB History   Grav Para Term Preterm Abortions TAB SAB Ect  Mult Living   5 3              Review of Systems  Constitutional: Negative for fever.  Respiratory: Positive for cough, choking, shortness of breath and wheezing.   Gastrointestinal: Negative for nausea and vomiting.  Neurological: Negative for syncope.  All other systems reviewed and are negative.    Allergies  Bee venom; Lavender oil; Amitriptyline; Entex; Etodolac; Flexeril; Iodine; Mushroom extract complex; Robaxin; and Shellfish allergy  Home Medications   Prior to Admission medications   Medication Sig Start Date End Date Taking? Authorizing Provider  albuterol (PROVENTIL HFA;VENTOLIN HFA) 108 (90 BASE) MCG/ACT inhaler Inhale 1-2 puffs into the lungs every 6 (six) hours as needed for wheezing or shortness of breath.   Yes Historical Provider, MD  aspirin-acetaminophen-caffeine (EXCEDRIN MIGRAINE) [phone] MG per tablet Take 2 tablets by mouth every 4 (four) hours as needed for migraine.   Yes Historical Provider, MD  diphenhydrAMINE (BENADRYL) 25 MG tablet Take 50 mg by mouth 4 (four) times daily.    Yes Historical Provider, MD  hydrOXYzine (VISTARIL) 50 MG capsule Take 50 mg by mouth 3 (three) times daily.   Yes Historical Provider, MD  metroNIDAZOLE (FLAGYL) 500 MG tablet Take 500 mg by mouth 2 (two) times daily.   Yes Historical Provider, MD  ondansetron (ZOFRAN) 4 MG tablet Take 8 mg by mouth 2 (two) times daily.   Yes Historical Provider, MD  rizatriptan (MAXALT) 5 MG tablet Take 1 tablet (5 mg total) by  mouth as needed for migraine. May repeat in 2 hours if needed 03/24/14  Yes Annett Gula Presson, PA  EPINEPHrine 0.3 mg/0.3 mL IJ SOAJ injection Inject 0.3 mLs (0.3 mg total) into the muscle daily as needed (allergic reaction). 06/11/14   Antonietta Breach, PA-C   BP 111/71  Pulse 68  Temp(Src) 98.6 F (37 C) (Oral)  Resp 15  SpO2 98%  Physical Exam  Nursing note and vitals reviewed. Constitutional: She is oriented to person, place, and time. She appears well-developed and  well-nourished. No distress.  Nontoxic/nonseptic appearing  HENT:  Head: Normocephalic and atraumatic.  Mouth/Throat: Oropharynx is clear and moist. No oropharyngeal exudate.  Oropharynx clear. Uvula midline. Patient tolerating secretions without difficulty or drooling.  Eyes: Conjunctivae and EOM are normal. No scleral icterus.  Neck: Normal range of motion. Neck supple.  Neck supple. No stridor.  Cardiovascular: Normal rate, regular rhythm, normal heart sounds and intact distal pulses.   Pulmonary/Chest: Effort normal and breath sounds normal. No stridor. No respiratory distress. She has no wheezes. She has no rales.  No tachypnea or dyspnea. No wheezing. Chest expansion symmetric.  Musculoskeletal: Normal range of motion.  Neurological: She is alert and oriented to person, place, and time. She exhibits normal muscle tone. Coordination normal.  Skin: Skin is warm and dry. No rash noted. She is not diaphoretic. No erythema. No pallor.  Psychiatric: She has a normal mood and affect. Her behavior is normal.    ED Course  Procedures (including critical care time) Labs Review Labs Reviewed - No data to display  Imaging Review No results found.   EKG Interpretation None      MDM   Final diagnoses:  Acute allergic reaction, initial encounter    Patient is a 37 year old female who presents to the emergency department after a severe allergic reaction to the smell of white musk. Patient ministered EpiPen prior to arrival at 57. She was also given 50 mg Benadryl by EMS. On initial presentation, patient is at baseline and symptom free. No tachypnea, dyspnea, or hypoxia. No wheezing or stridor. Chest expansion symmetric and lung sounds clear bilaterally. Oropharynx clear. No angioedema and patient tolerating secretions without difficulty.  Patient has been monitored in the ED until 4 hours out from EpiPen injection without evidence of rebound. On examination, patient remains  hemodynamically stable and asymptomatic. Believe she is stable for discharge at this time with instruction to followup with her primary care doctor. Return precautions also provided and discussed. Patient agreeable to plan with no unaddressed concerns.   Filed Vitals:   06/11/14 0308 06/11/14 0310 06/11/14 0446 06/11/14 0530  BP: 110/64  111/71   Pulse: 80  68   Temp: 98.5 F (36.9 C)  98.6 F (37 C)   TempSrc: Oral  Oral   Resp: 18  15   SpO2: 99% 100% 100% 98%       Antonietta Breach, PA-C 06/11/14 [phone]

## 2014-06-11 NOTE — ED Provider Notes (Signed)
Medical screening examination/treatment/procedure(s) were conducted as a shared visit with non-physician practitioner(s) or resident  and myself.  I personally evaluated the patient during the encounter and agree with the findings.   I have personally reviewed any xrays and/ or EKG's with the provider and I agree with interpretation.   Patient with history of anaphylaxis to lavender and white Mus presents after allergies/anaphylactic symptoms following exposure. Patient developed significant breathing difficulty felt increased secretions followed by her administering EpiPen if improvement of symptoms. Patient observed in ER. Patient has no angioedema, lungs clear, no stridor, well-appearing in no symptoms on recheck. Followup outpatient discussed. Patient does not need refill of EpiPen.  Anaphylaxis  Brandi Clonts, MD 06/11/14 [phone]

## 2014-06-11 NOTE — ED Notes (Signed)
Pt arrived to the ED with the complaint of an allergic reaction.  Pt got an anaphylactic reaction to the smell of white musk.  Pt gave herself a epi pen.  Pt also was given 50 mg benadryl by EMS.  Pt also takes benadryl by prescription.  Pt also used her albuterol inhaler.  Pt is able to vocalize complaint.

## 2014-06-11 NOTE — ED Notes (Signed)
Bed: HCW2 Expected date:  Expected time:  Means of arrival:  Comments: EMS 38yo F allergic reaction

## 2014-07-04 ENCOUNTER — Emergency Department (HOSPITAL_BASED_OUTPATIENT_CLINIC_OR_DEPARTMENT_OTHER)
Admission: EM | Admit: 2014-07-04 | Discharge: 2014-07-05 | Disposition: A | Discharge status: Home / Self Care | Payer: Self-pay | Attending: Emergency Medicine | Admitting: Emergency Medicine

## 2014-07-04 ENCOUNTER — Emergency Department (HOSPITAL_BASED_OUTPATIENT_CLINIC_OR_DEPARTMENT_OTHER): Payer: Self-pay

## 2014-07-04 ENCOUNTER — Encounter (HOSPITAL_BASED_OUTPATIENT_CLINIC_OR_DEPARTMENT_OTHER): Payer: Self-pay | Admitting: Emergency Medicine

## 2014-07-04 DIAGNOSIS — S90111A Contusion of right great toe without damage to nail, initial encounter: Secondary | ICD-10-CM

## 2014-07-04 DIAGNOSIS — Z87442 Personal history of urinary calculi: Secondary | ICD-10-CM | POA: Insufficient documentation

## 2014-07-04 DIAGNOSIS — S90129A Contusion of unspecified lesser toe(s) without damage to nail, initial encounter: Secondary | ICD-10-CM | POA: Insufficient documentation

## 2014-07-04 DIAGNOSIS — Y9289 Other specified places as the place of occurrence of the external cause: Secondary | ICD-10-CM | POA: Insufficient documentation

## 2014-07-04 DIAGNOSIS — Z792 Long term (current) use of antibiotics: Secondary | ICD-10-CM | POA: Insufficient documentation

## 2014-07-04 DIAGNOSIS — S99919A Unspecified injury of unspecified ankle, initial encounter: Secondary | ICD-10-CM

## 2014-07-04 DIAGNOSIS — S99929A Unspecified injury of unspecified foot, initial encounter: Secondary | ICD-10-CM

## 2014-07-04 DIAGNOSIS — Z79899 Other long term (current) drug therapy: Secondary | ICD-10-CM | POA: Insufficient documentation

## 2014-07-04 DIAGNOSIS — F17200 Nicotine dependence, unspecified, uncomplicated: Secondary | ICD-10-CM | POA: Insufficient documentation

## 2014-07-04 DIAGNOSIS — G43909 Migraine, unspecified, not intractable, without status migrainosus: Secondary | ICD-10-CM | POA: Insufficient documentation

## 2014-07-04 DIAGNOSIS — W2203XA Walked into furniture, initial encounter: Secondary | ICD-10-CM | POA: Insufficient documentation

## 2014-07-04 DIAGNOSIS — Y9389 Activity, other specified: Secondary | ICD-10-CM | POA: Insufficient documentation

## 2014-07-04 DIAGNOSIS — S8990XA Unspecified injury of unspecified lower leg, initial encounter: Secondary | ICD-10-CM | POA: Insufficient documentation

## 2014-07-04 MED ORDER — ACETAMINOPHEN 325 MG PO TABS: 650.0000 mg | ORAL_TABLET | Freq: Once | ORAL | Status: DC

## 2014-07-04 MED ORDER — ACETAMINOPHEN 500 MG PO TABS
1000.0000 mg | ORAL_TABLET | Freq: Once | ORAL | Status: AC
  Administered 2014-07-04: 1000 mg via ORAL
  Filled 2014-07-04: qty 2

## 2014-07-04 NOTE — ED Notes (Signed)
PT ambulated with baseline gait; VSS; A&Ox3; no signs of distress; respirations even and unlabored; skin warm and dry; no questions upon discharge.  

## 2014-07-04 NOTE — Discharge Instructions (Signed)
Contusion Wear the post op shoe for comfort. Take tylenol as needed for pain. See Dr. Jimmye Norman or return if concerned for any reason A contusion is a deep bruise. Contusions are the result of an injury that caused bleeding under the skin. The contusion may turn blue, purple, or yellow. Minor injuries will give you a painless contusion, but more severe contusions may stay painful and swollen for a few weeks.  CAUSES  A contusion is usually caused by a blow, trauma, or direct force to an area of the body. SYMPTOMS   Swelling and redness of the injured area.  Bruising of the injured area.  Tenderness and soreness of the injured area.  Pain. DIAGNOSIS  The diagnosis can be made by taking a history and physical exam. An X-ray, CT scan, or MRI may be needed to determine if there were any associated injuries, such as fractures. TREATMENT  Specific treatment will depend on what area of the body was injured. In general, the best treatment for a contusion is resting, icing, elevating, and applying cold compresses to the injured area. Over-the-counter medicines may also be recommended for pain control. Ask your caregiver what the best treatment is for your contusion. HOME CARE INSTRUCTIONS   Put ice on the injured area.  Put ice in a plastic bag.  Place a towel between your skin and the bag.  Leave the ice on for 15-20 minutes, 3-4 times a day, or as directed by your health care provider.  Only take over-the-counter or prescription medicines for pain, discomfort, or fever as directed by your caregiver. Your caregiver may recommend avoiding anti-inflammatory medicines (aspirin, ibuprofen, and naproxen) for 48 hours because these medicines may increase bruising.  Rest the injured area.  If possible, elevate the injured area to reduce swelling. SEEK IMMEDIATE MEDICAL CARE IF:   You have increased bruising or swelling.  You have pain that is getting worse.  Your swelling or pain is not  relieved with medicines. MAKE SURE YOU:   Understand these instructions.  Will watch your condition.  Will get help right away if you are not doing well or get worse. Document Released: 08/14/2005 Document Revised: 11/09/2013 Document Reviewed: 09/09/2011 [facility] Patient Information 2015 [place], [place]. This information is not intended to replace advice given to you by your health care provider. Make sure you discuss any questions you have with your health care provider.

## 2014-07-04 NOTE — ED Provider Notes (Signed)
CSN: [id number]     Arrival date & time 07/04/14  2118 History  This chart was scribed for Orlie Dakin, MD by Ladene Artist, ED Scribe. The patient was seen in room MH10/MH10. Patient's care was started at 10:36 PM.   Chief Complaint  Patient presents with  . Toe Injury   Patient is a 38 y.o. female presenting with toe pain. The history is provided by the patient. No language interpreter was used.  Toe Pain This is a new problem. The current episode started 6 to 12 hours ago. The problem occurs rarely. The problem has not changed since onset.The symptoms are aggravated by walking. The symptoms are relieved by NSAIDs. The treatment provided mild relief.   HPI Comments: Brandi Morris is a 38 y.o. female who presents to the Emergency Department complaining of R great toe injury sustained approximately 6 hours ago. Pt states that she was moving furniture when a dresser hit her toe. Pt states that she can not walk flat-footed due to severity of pain. She denies any other pain at this time. Pt has tried ibuprofen with temporary relief. Pt is a smoker.  PCP: York Ram  Past Medical History  Diagnosis Date  . Migraine   . Kidney stone   . Interstitial cystitis   . Multiple allergies    Past Surgical History  Procedure Laterality Date  . Rhinoplasty    . Bladder augmentation    . Tubal ligation    . Knee surgery    . Tonsillectomy    . Cholecystectomy     History reviewed. No pertinent family history. History  Substance Use Topics  . Smoking status: Current Every Day Smoker  . Smokeless tobacco: Not on file  . Alcohol Use: No   OB History   Grav Para Term Preterm Abortions TAB SAB Ect Mult Living   5 3             Review of Systems  Constitutional: Negative.   Musculoskeletal: Positive for arthralgias.       Right great toe pain   Allergies  Bee venom; Lavender oil; Amitriptyline; Entex; Etodolac; Flexeril; Iodine; Mushroom extract complex; Robaxin; and Shellfish  allergy  Home Medications   Prior to Admission medications   Medication Sig Start Date End Date Taking? Authorizing Provider  albuterol (PROVENTIL HFA;VENTOLIN HFA) 108 (90 BASE) MCG/ACT inhaler Inhale 1-2 puffs into the lungs every 6 (six) hours as needed for wheezing or shortness of breath.    Historical Provider, MD  aspirin-acetaminophen-caffeine (EXCEDRIN MIGRAINE) [phone] MG per tablet Take 2 tablets by mouth every 4 (four) hours as needed for migraine.    Historical Provider, MD  diphenhydrAMINE (BENADRYL) 25 MG tablet Take 50 mg by mouth 4 (four) times daily.     Historical Provider, MD  EPINEPHrine 0.3 mg/0.3 mL IJ SOAJ injection Inject 0.3 mLs (0.3 mg total) into the muscle daily as needed (allergic reaction). 06/11/14   Antonietta Breach, PA-C  hydrOXYzine (VISTARIL) 50 MG capsule Take 50 mg by mouth 3 (three) times daily.    Historical Provider, MD  metroNIDAZOLE (FLAGYL) 500 MG tablet Take 500 mg by mouth 2 (two) times daily.    Historical Provider, MD  ondansetron (ZOFRAN) 4 MG tablet Take 8 mg by mouth 2 (two) times daily.    Historical Provider, MD  rizatriptan (MAXALT) 5 MG tablet Take 1 tablet (5 mg total) by mouth as needed for migraine. May repeat in 2 hours if needed 03/24/14   Annett Gula  H Presson, PA   Triage Vitals: BP 113/68  Pulse 91  Temp(Src) 98 F (36.7 C) (Oral)  Resp 16  Ht 5\' 2"  (1.575 m)  Wt 160 lb (72.576 kg)  BMI 29.26 kg/m2  SpO2 100% Physical Exam  Nursing note and vitals reviewed. Constitutional: She appears well-developed and well-nourished. She appears distressed.  HENT:  Head: Normocephalic and atraumatic.  Eyes: Conjunctivae are normal.  Neck: Neck supple.  Cardiovascular: Normal rate.   Pulmonary/Chest: Effort normal.  Abdominal: She exhibits no distension.  Musculoskeletal:  rle skin in tact , foot without swelling tender at mcp joint of great to , no ecchymosis, good cap refill. All other extremities no tender, n/yv intact    ED Course   Procedures (including critical care time) DIAGNOSTIC STUDIES: Oxygen Saturation is 100% on RA, normal by my interpretation.    COORDINATION OF CARE: 10:40 PM-Discussed treatment plan which includes XR with pt at bedside and pt agreed to plan.   Labs Review Labs Reviewed - No data to display  Imaging Review Dg Toe Great Right  07/04/2014   CLINICAL DATA:  First toe pain.  Dresser fell on foot.  EXAM: RIGHT GREAT TOE  COMPARISON:  None.  FINDINGS: There is no evidence of fracture or dislocation. There is no evidence of arthropathy or other focal bone abnormality. Soft tissues are unremarkable.  IMPRESSION: Negative.   Electronically Signed   By: Shon Hale M.D.   On: 07/04/2014 21:51    EKG Interpretation None     1050 pm pt comfortable in post op shoe MDM  Plan tylenol prn pain f/u dr Jimmye Norman as needed Final diagnoses:  None      I personally performed the services described in this documentation, which was scribed in my presence. The recorded information has been reviewed and is accurate.    Orlie Dakin, MD 07/04/14 2253

## 2014-07-04 NOTE — ED Notes (Signed)
Pt c/o right great toe injury x 6 hrs ago

## 2014-08-01 ENCOUNTER — Emergency Department (HOSPITAL_BASED_OUTPATIENT_CLINIC_OR_DEPARTMENT_OTHER): Payer: Self-pay

## 2014-08-01 ENCOUNTER — Emergency Department (HOSPITAL_BASED_OUTPATIENT_CLINIC_OR_DEPARTMENT_OTHER)
Admission: EM | Admit: 2014-08-01 | Discharge: 2014-08-01 | Disposition: A | Discharge status: Home / Self Care | Payer: Self-pay | Attending: Emergency Medicine | Admitting: Emergency Medicine

## 2014-08-01 ENCOUNTER — Encounter (HOSPITAL_BASED_OUTPATIENT_CLINIC_OR_DEPARTMENT_OTHER): Payer: Self-pay | Admitting: Emergency Medicine

## 2014-08-01 DIAGNOSIS — Z79899 Other long term (current) drug therapy: Secondary | ICD-10-CM | POA: Insufficient documentation

## 2014-08-01 DIAGNOSIS — M25511 Pain in right shoulder: Secondary | ICD-10-CM

## 2014-08-01 DIAGNOSIS — Z87448 Personal history of other diseases of urinary system: Secondary | ICD-10-CM | POA: Insufficient documentation

## 2014-08-01 DIAGNOSIS — F17200 Nicotine dependence, unspecified, uncomplicated: Secondary | ICD-10-CM | POA: Insufficient documentation

## 2014-08-01 DIAGNOSIS — Z87442 Personal history of urinary calculi: Secondary | ICD-10-CM | POA: Insufficient documentation

## 2014-08-01 DIAGNOSIS — G43909 Migraine, unspecified, not intractable, without status migrainosus: Secondary | ICD-10-CM | POA: Insufficient documentation

## 2014-08-01 DIAGNOSIS — M25519 Pain in unspecified shoulder: Secondary | ICD-10-CM | POA: Insufficient documentation

## 2014-08-01 MED ORDER — OXYCODONE-ACETAMINOPHEN 5-325 MG PO TABS: 1.0000 | ORAL_TABLET | Freq: Four times a day (QID) | ORAL | Status: DC | PRN

## 2014-08-01 MED ORDER — OXYCODONE-ACETAMINOPHEN 5-325 MG PO TABS
1.0000 | ORAL_TABLET | Freq: Once | ORAL | Status: AC
  Administered 2014-08-01: 1 via ORAL
  Filled 2014-08-01: qty 1

## 2014-08-01 NOTE — ED Provider Notes (Signed)
CSN: [id number]     Arrival date & time 08/01/14  2002 History   This chart was scribed for Brandi Beards, MD, by Neta Ehlers, ED Scribe. This patient was seen in room MH05/MH05 and the patient's care was started at 10:49 PM.  First MD Initiated Contact with Patient 08/01/14 2154     Chief Complaint  Patient presents with  . Shoulder Pain    HPI HPI Comments: Brandi Morris is a 38 y.o. female who presents to the Emergency Department complaining of a pain to her shoulder which which has persisted for approximately a month, but which has worsened in the past few days. She rates the pain as 6/10, and she states the pain is increased with movement and pressure.  She also endorses a knot to the shoulder which she states has been present for "a while."  She denies recent injury or trauma to the site. Brandi Morris has been treated in the ED 8 times in the past six months. She is a current smoker. 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  There are no other associated systemic symptoms, there are no other alleviating or modifying factors.   Brandi Morris endorses an allergy to muscle relaxants.   Past Medical History  Diagnosis Date  . Migraine   . Kidney stone   . Interstitial cystitis   . Multiple allergies    Past Surgical History  Procedure Laterality Date  . Rhinoplasty    . Bladder augmentation    . Tubal ligation    . Knee surgery    . Tonsillectomy    . Cholecystectomy     No family history on file. History  Substance Use Topics  . Smoking status: Current Every Day Smoker  . Smokeless tobacco: Not on file  . Alcohol Use: No   OB History   Grav Para Term Preterm Abortions TAB SAB Ect Mult Living   5 3             Review of Systems  Musculoskeletal: Positive for arthralgias.  All other systems reviewed and are negative.   Allergies  Bee venom; Lavender oil; Amitriptyline; Entex; Etodolac; Flexeril; Iodine; Mushroom extract complex;  Robaxin; and Shellfish allergy  Home Medications   Prior to Admission medications   Medication Sig Start Date End Date Taking? Authorizing Provider  albuterol (PROVENTIL HFA;VENTOLIN HFA) 108 (90 BASE) MCG/ACT inhaler Inhale 1-2 puffs into the lungs every 6 (six) hours as needed for wheezing or shortness of breath.    Historical Provider, MD  aspirin-acetaminophen-caffeine (EXCEDRIN MIGRAINE) [phone] MG per tablet Take 2 tablets by mouth every 4 (four) hours as needed for migraine.    Historical Provider, MD  diphenhydrAMINE (BENADRYL) 25 MG tablet Take 50 mg by mouth 4 (four) times daily.     Historical Provider, MD  EPINEPHrine 0.3 mg/0.3 mL IJ SOAJ injection Inject 0.3 mLs (0.3 mg total) into the muscle daily as needed (allergic reaction). 06/11/14   Antonietta Breach, PA-C  hydrOXYzine (VISTARIL) 50 MG capsule Take 50 mg by mouth 3 (three) times daily.    Historical Provider, MD  metroNIDAZOLE (FLAGYL) 500 MG tablet Take 500 mg by mouth 2 (two) times daily.    Historical Provider, MD  ondansetron (ZOFRAN) 4 MG tablet Take 8 mg by mouth 2 (two) times daily.    Historical Provider, MD  oxyCODONE-acetaminophen (PERCOCET/ROXICET) 5-325 MG per tablet Take 1-2 tablets by mouth every 6 (six) hours as needed  for severe pain. 08/01/14   Brandi Beards, MD  rizatriptan (MAXALT) 5 MG tablet Take 1 tablet (5 mg total) by mouth as needed for migraine. May repeat in 2 hours if needed 03/24/14   Lutricia Feil, PA   Triage Vitals: BP 105/76  Pulse 85  Temp(Src) 98.1 F (36.7 C) (Oral)  Resp 20  Ht 5\' 2"  (1.575 m)  Wt 160 lb (72.576 kg)  BMI 29.26 kg/m2  SpO2 100% Vitals reviewed Physical Exam Physical Examination: General appearance - alert, well appearing, and in no distress Mental status - alert, oriented to person, place, and time Eyes - no conjunctival injection, no scleral icterus Chest - clear to auscultation, no wheezes, rales or rhonchi, symmetric air entry Heart - normal rate, regular  rhythm, normal S1, S2, no murmurs, rubs, clicks or gallops Musculoskeletal - ttp over superiorior aspect of right AC joint [facility] tendon, otherwise no joint tenderness, deformity or swelling Extremities - peripheral pulses normal, no pedal edema, no clubbing or cyanosis Skin - normal coloration and turgor, no rashes  ED Course  Procedures (including critical care time)  DIAGNOSTIC STUDIES: Oxygen Saturation is 100% on room air, normal by my interpretation.    COORDINATION OF CARE:  10:52 PM- Discussed treatment plan with patient, and the patient agreed to the plan. The plan includes pain medication. Advised pt to follow-up with an orthopedic.   Labs Review Labs Reviewed - No data to display  Imaging Review Dg Shoulder Right  08/01/2014   CLINICAL DATA:  Shoulder pain 1 month.  EXAM: RIGHT SHOULDER - 2+ VIEW  COMPARISON:  None.  FINDINGS: There is no evidence of fracture or dislocation. There is no evidence of arthropathy or other focal bone abnormality. Soft tissues are unremarkable.  IMPRESSION: Negative.   Electronically Signed   By: Marin Olp M.D.   On: 08/01/2014 20:40     EKG Interpretation None      MDM   Final diagnoses:  Acromioclavicular pain, right    Pt with ttp over superior aspect of AC joint- no injury, suspect arthritis in that joint.  Pt given pain control and information for followup with orthopedics, also given sling.  Discharged with strict return precautions.  Pt agreeable with plan.  I personally performed the services described in this documentation, which was scribed in my presence. The recorded information has been reviewed and is accurate.  Nursing notes including past medical history and social history reviewed and considered in documentation Prior records reviewed and considered during this visit    Brandi Beards, MD 08/01/14 [phone]

## 2014-08-01 NOTE — Discharge Instructions (Signed)
Return to the ED with any concerns including increased pain, swelling, or any other alarming symptoms °

## 2014-08-01 NOTE — ED Notes (Signed)
Pain in her right shoulder for a month. Knot on top of her shoulder. No injury.

## 2014-08-11 ENCOUNTER — Other Ambulatory Visit (HOSPITAL_COMMUNITY): Payer: Self-pay | Admitting: *Deleted

## 2014-08-11 DIAGNOSIS — N6310 Unspecified lump in the right breast, unspecified quadrant: Secondary | ICD-10-CM

## 2014-08-23 ENCOUNTER — Ambulatory Visit
Admission: RE | Admit: 2014-08-23 | Discharge: 2014-08-23 | Disposition: A | Discharge status: Home / Self Care | Payer: No Typology Code available for payment source | Source: Ambulatory Visit | Attending: Obstetrics and Gynecology | Admitting: Obstetrics and Gynecology

## 2014-08-23 ENCOUNTER — Ambulatory Visit (HOSPITAL_COMMUNITY)
Admission: RE | Admit: 2014-08-23 | Discharge: 2014-08-23 | Disposition: A | Discharge status: Home / Self Care | Payer: No Typology Code available for payment source | Source: Ambulatory Visit | Attending: Obstetrics and Gynecology | Admitting: Obstetrics and Gynecology

## 2014-08-23 ENCOUNTER — Encounter (HOSPITAL_COMMUNITY): Payer: Self-pay

## 2014-08-23 VITALS — BP 108/60 | Temp 98.4°F | Ht 62.0 in | Wt 159.6 lb

## 2014-08-23 DIAGNOSIS — N6310 Unspecified lump in the right breast, unspecified quadrant: Secondary | ICD-10-CM

## 2014-08-23 DIAGNOSIS — N6311 Unspecified lump in the right breast, upper outer quadrant: Secondary | ICD-10-CM | POA: Insufficient documentation

## 2014-08-23 DIAGNOSIS — Z01419 Encounter for gynecological examination (general) (routine) without abnormal findings: Secondary | ICD-10-CM

## 2014-08-23 HISTORY — DX: Endometriosis, unspecified: N80.9

## 2014-08-23 NOTE — Progress Notes (Signed)
Complaints of right breast lump x 2 months.  Pap Smear:  Pap smear completed today. Patients last Pap smear was in November 2012 and normal per patient. Per patient has a history of an abnormal Pap smear in 2008 that required a repeat Pap smear for follow up that was normal. Per patient has only had two Pap smears since abnormal Pap smear in 2008. No Pap smear results in EPIC.  Physical exam: Breasts Breasts symmetrical. No skin abnormalities bilateral breasts. No nipple retraction bilateral breasts. No nipple discharge bilateral breasts. No lymphadenopathy. No lumps left breast. Palpated a pea sized lump within the right breast at 11 o'clock 5 cm from the nipple. Complaints of tenderness when palpated left center breast and right outer breast on exam. Referred patient to the [facility] for diagnostic mammogram and right breast ultrasound. Appointment scheduled for Tuesday, August 23, 2014 at [place].            Pelvic/Bimanual   Ext Genitalia No lesions, no swelling and no discharge observed on external genitalia.         Vagina Vagina pink and normal texture. No lesions or discharge observed in vagina.          Cervix Cervix is present. Cervix pink and of normal texture. No discharge observed.     Uterus Uterus is present and palpable. Uterus in normal position and normal size.        Adnexae Bilateral ovaries present and palpable. No tenderness on palpation.          Rectovaginal No rectal exam completed today since patient had no rectal complaints. No skin abnormalities observed on exam.      Smoking Cessation discussed with patient and resources given.

## 2014-08-23 NOTE — Patient Instructions (Addendum)
Explained to [employer] that based on the results of today' Pap smear will determine when her next Pap smear is recommended due to her history of an abnormal Pap smear 7 years ago. Referred patient to the [facility] for diagnostic mammogram and right breast ultrasound. Appointment scheduled for Tuesday, August 23, 2014 at [place]. Patient aware of appointment and will be there. Let patient know will follow up with her within the next couple weeks with results of Pap smear by phone. Discussed smoking cessation and resources given. Stina H Falotico verbalized understanding.  Yaniris Braddock, Arvil Chaco, RN 8:52 AM

## 2014-08-24 LAB — CYTOLOGY - PAP

## 2014-09-07 ENCOUNTER — Telehealth (HOSPITAL_COMMUNITY): Payer: Self-pay | Admitting: *Deleted

## 2014-09-07 NOTE — Telephone Encounter (Signed)
Telephoned patient at home # and voicemail box not set up.

## 2014-09-08 ENCOUNTER — Telehealth (HOSPITAL_COMMUNITY): Payer: Self-pay | Admitting: *Deleted

## 2014-09-08 NOTE — Telephone Encounter (Signed)
Patient returned phone call and advised of abnormal pap smear and need for colposcopy. Explained colposcopy and advised of appointment at [facility]. Patient voiced understanding.

## 2014-09-08 NOTE — Telephone Encounter (Signed)
Telephoned patient at home # and voicemail box not set up. Telephoned patient's contact information and left message to return call to [facility].

## 2014-09-19 ENCOUNTER — Encounter: Payer: Self-pay | Admitting: Nurse Practitioner

## 2014-09-19 ENCOUNTER — Ambulatory Visit (INDEPENDENT_AMBULATORY_CARE_PROVIDER_SITE_OTHER): Payer: Medicaid Other | Admitting: Nurse Practitioner

## 2014-09-19 ENCOUNTER — Other Ambulatory Visit (HOSPITAL_COMMUNITY)
Admission: RE | Admit: 2014-09-19 | Discharge: 2014-09-19 | Disposition: A | Discharge status: Home / Self Care | Payer: Self-pay | Source: Ambulatory Visit | Attending: Nurse Practitioner | Admitting: Nurse Practitioner

## 2014-09-19 VITALS — BP 104/68 | HR 86 | Temp 98.4°F | Ht 62.0 in | Wt 158.9 lb

## 2014-09-19 DIAGNOSIS — R87610 Atypical squamous cells of undetermined significance on cytologic smear of cervix (ASC-US): Secondary | ICD-10-CM | POA: Diagnosis present

## 2014-09-19 DIAGNOSIS — R87619 Unspecified abnormal cytological findings in specimens from cervix uteri: Secondary | ICD-10-CM | POA: Insufficient documentation

## 2014-09-19 LAB — POCT PREGNANCY, URINE: Preg Test, Ur: NEGATIVE

## 2014-09-19 NOTE — Addendum Note (Signed)
Addended by: Samuel Germany on: 09/19/2014 03:31 PM   Modules accepted: Orders

## 2014-09-19 NOTE — Patient Instructions (Signed)
Colposcopy Colposcopy is a procedure to examine your cervix and vagina, or the area around the outside of your vagina, for abnormalities or signs of disease. The procedure is done using a lighted microscope called a colposcope. Tissue samples may be collected during the colposcopy if your health care provider finds any unusual cells. A colposcopy may be done if a woman has:  An abnormal Pap test. A Pap test is a medical test done to evaluate cells that are on the surface of the cervix.  A Pap test result that is suggestive of human papillomavirus (HPV). This virus can cause genital warts and is linked to the development of cervical cancer.  A sore on her cervix and the results of a Pap test were normal.  Genital warts on the cervix or in or around the outside of the vagina.  A mother who took the drug diethylstilbestrol (DES) while pregnant.  Painful intercourse.  Vaginal bleeding, especially after sexual intercourse. LET [facility] CARE PROVIDER KNOW ABOUT:  Any allergies you have.  All medicines you are taking, including vitamins, herbs, eye drops, creams, and over-the-counter medicines.  Previous problems you or members of your family have had with the use of anesthetics.  Any blood disorders you have.  Previous surgeries you have had.  Medical conditions you have. RISKS AND COMPLICATIONS Generally, a colposcopy is a safe procedure. However, as with any procedure, complications can occur. Possible complications include:  Bleeding.  Infection.  Missed lesions. BEFORE THE PROCEDURE   Tell your health care provider if you have your menstrual period. A colposcopy typically is not done during menstruation.  For 24 hours before the colposcopy, do not:  [place].  Use tampons.  Use medicines, creams, or suppositories in the vagina.  Have sexual intercourse. PROCEDURE  During the procedure, you will be lying on your back with your feet in foot rests (stirrups). A warm  metal or plastic instrument (speculum) will be placed in your vagina to keep it open and to allow the health care provider to see the cervix. The colposcope will be placed outside the vagina. It will be used to magnify and examine the cervix, vagina, and the area around the outside of the vagina. A small amount of liquid solution will be placed on the area that is to be viewed. This solution will make it easier to see the abnormal cells. Your health care provider will use tools to suck out mucus and cells from the canal of the cervix. Then he or she will record the location of the abnormal areas. If a biopsy is done during the procedure, a medicine will usually be given to numb the area (local anesthetic). You may feel mild pain or cramping while the biopsy is done. After the procedure, tissue samples collected during the biopsy will be sent to a lab for analysis. AFTER THE PROCEDURE  You will be given instructions on when to follow up with your health care provider for your test results. It is important to keep your appointment. Document Released: 01/25/2003 Document Revised: 07/07/2013 Document Reviewed: 06/03/2013 [facility] Patient Information 2015 [place], [place]. This information is not intended to replace advice given to you by your health care provider. Make sure you discuss any questions you have with your health care provider. COLPOSCOPY POST-PROCEDURE INSTRUCTIONS  1. You may take Ibuprofen, Aleve or Tylenol for cramping if needed.  2. If Monsel's solution was used, you will have a black discharge.  3. Light bleeding is normal.  If bleeding  is heavier than your period, please call.  4. Put nothing in your vagina until the bleeding or discharge stops (usually 2 or3 days).  5. We will call you within one week with biopsy results or discuss the results at your follow-up appointment if needed. 6.

## 2014-09-19 NOTE — Progress Notes (Signed)
    GYNECOLOGY CLINIC COLPOSCOPY PROCEDURE NOTE  38 y.o. [id number] here for colposcopy for ASCUS with POSITIVE high risk HPV pap smear on 08/23/14. Her last abnormal pap was 12 years ago. Discussed role for HPV in cervical dysplasia, need for surveillance.  Patient given informed consent, signed copy in the chart, time out was performed.  Placed in lithotomy position. Cervix viewed with speculum and colposcope after application of acetic acid.   Colposcopy adequate? Yes  visible lesion(s) at 12, 6, 7, 9 o'clock; biopsies obtained.  ECC specimen obtained. All specimens were labelled and sent to pathology.  Patient was given post procedure instructions.  Will follow up pathology and manage accordingly.  Routine preventative health maintenance measures emphasized.  Results for orders placed or performed in visit on 09/19/14 (from the past 24 hour(s))  Pregnancy, urine POC     Status: None   Collection Time: 09/19/14  2:31 PM  Result Value Ref Range   Preg Test, Ur NEGATIVE NEGATIVE     Iley Deignan, NP [facility] [place]

## 2014-09-26 ENCOUNTER — Encounter (HOSPITAL_COMMUNITY): Payer: Self-pay | Admitting: Emergency Medicine

## 2014-09-26 ENCOUNTER — Emergency Department (INDEPENDENT_AMBULATORY_CARE_PROVIDER_SITE_OTHER)
Admission: EM | Admit: 2014-09-26 | Discharge: 2014-09-26 | Disposition: A | Discharge status: Home / Self Care | Payer: Medicaid Other | Source: Home / Self Care | Attending: Family Medicine | Admitting: Family Medicine

## 2014-09-26 DIAGNOSIS — J40 Bronchitis, not specified as acute or chronic: Secondary | ICD-10-CM

## 2014-09-26 MED ORDER — ALBUTEROL SULFATE HFA 108 (90 BASE) MCG/ACT IN AERS
INHALATION_SPRAY | RESPIRATORY_TRACT | Status: AC
  Filled 2014-09-26: qty 6.7

## 2014-09-26 MED ORDER — ALBUTEROL SULFATE HFA 108 (90 BASE) MCG/ACT IN AERS
1.0000 | INHALATION_SPRAY | Freq: Four times a day (QID) | RESPIRATORY_TRACT | Status: DC | PRN
  Administered 2014-09-26: 1 via RESPIRATORY_TRACT

## 2014-09-26 MED ORDER — PREDNISONE 20 MG PO TABS: 60.0000 mg | ORAL_TABLET | Freq: Every day | ORAL | Status: DC

## 2014-09-26 MED ORDER — IPRATROPIUM-ALBUTEROL 0.5-2.5 (3) MG/3ML IN SOLN
3.0000 mL | Freq: Once | RESPIRATORY_TRACT | Status: AC
  Administered 2014-09-26: 3 mL via RESPIRATORY_TRACT

## 2014-09-26 MED ORDER — IPRATROPIUM-ALBUTEROL 0.5-2.5 (3) MG/3ML IN SOLN
RESPIRATORY_TRACT | Status: AC
  Filled 2014-09-26: qty 3

## 2014-09-26 NOTE — ED Notes (Signed)
Pt states that  She has had generalized pain since yesterday with cough.

## 2014-09-26 NOTE — ED Provider Notes (Signed)
Brandi Morris is a 38 y.o. female who presents to Urgent Care today for cough congestion shortness of breath and wheezing. This is also associated with fever and body aches. Symptoms present for one day. Patient has tried ibuprofen and Mucinex.   Past Medical History  Diagnosis Date  . Migraine   . Kidney stone   . Interstitial cystitis   . Multiple allergies   . Endometriosis    Past Surgical History  Procedure Laterality Date  . Rhinoplasty    . Bladder augmentation    . Tubal ligation    . Knee surgery    . Tonsillectomy    . Cholecystectomy     History  Substance Use Topics  . Smoking status: Current Every Day Smoker -- 0.50 packs/day for 25 years    Types: Cigarettes  . Smokeless tobacco: Not on file  . Alcohol Use: No   ROS as above Medications: Current Facility-Administered Medications  Medication Dose Route Frequency Provider Last Rate Last Dose  . albuterol (PROVENTIL HFA;VENTOLIN HFA) 108 (90 BASE) MCG/ACT inhaler 1-2 puff  1-2 puff Inhalation Q6H PRN Gregor Hams, MD   1 puff at 09/26/14 1224   Current Outpatient Prescriptions  Medication Sig Dispense Refill  . albuterol (PROVENTIL HFA;VENTOLIN HFA) 108 (90 BASE) MCG/ACT inhaler Inhale 1-2 puffs into the lungs every 6 (six) hours as needed for wheezing or shortness of breath.    Marland Kitchen aspirin-acetaminophen-caffeine (EXCEDRIN MIGRAINE) 250-250-65 MG per tablet Take 2 tablets by mouth every 4 (four) hours as needed for migraine.    . diphenhydrAMINE (BENADRYL) 25 MG tablet Take 50 mg by mouth 4 (four) times daily.     Marland Kitchen EPINEPHrine 0.3 mg/0.3 mL IJ SOAJ injection Inject 0.3 mLs (0.3 mg total) into the muscle daily as needed (allergic reaction). 1 Device 1  . hydrOXYzine (VISTARIL) 50 MG capsule Take 50 mg by mouth 3 (three) times daily.    . predniSONE (DELTASONE) 20 MG tablet Take 3 tablets (60 mg total) by mouth daily. 15 tablet 0  . rizatriptan (MAXALT) 5 MG tablet Take 1 tablet (5 mg total) by mouth as needed for  migraine. May repeat in 2 hours if needed 10 tablet 0   Allergies  Allergen Reactions  . Bee Venom Anaphylaxis  . Lavender Oil Anaphylaxis and Other (See Comments)    Lungs and sinuses fill up with fluid  . Amitriptyline Other (See Comments)    "sees weird things"  . Entex Other (See Comments)    dizzy  . Etodolac Other (See Comments)    dizzy  . Flexeril [Cyclobenzaprine] Other (See Comments)    Physically violent    . Iodine Hives  . Mushroom Extract Complex Hives  . Robaxin [Methocarbamol] Other (See Comments)    physically violent  . Shellfish Allergy Hives     Exam:  BP 112/74 mmHg  Pulse 88  Temp(Src) 98.1 F (36.7 C) (Oral)  Resp 16  SpO2 99%   Gen: Well NAD HEENT: EOMI,  MMM Lungs: Normal work of breathing. Prolonged expiratory phase and wheezing bilaterally. Heart: RRR no MRG Abd: NABS, Soft. Nondistended, Nontender Exts: Brisk capillary refill, warm and well perfused.   Patient was given a 2.5/0.5 mg DuoNeb nebulizer treatment and felt better  No results found for this or any previous visit (from the past 24 hour(s)). No results found.  Assessment and Plan: 38 y.o. female with bronchitis likely due to virus and smoking. Treatment with prednisone and albuterol.  Discussed warning signs  or symptoms. Please see discharge instructions. Patient expresses understanding.     Gregor Hams, MD 09/26/14 [phone]

## 2014-09-26 NOTE — Discharge Instructions (Signed)
Thank you for coming in today. Use albuterol as needed.  Take prednisone daily for 5 days.  This medicine will be $4 at [facility].  Come back as needed.   Acute Bronchitis Bronchitis is inflammation of the airways that extend from the windpipe into the lungs (bronchi). The inflammation often causes mucus to develop. This leads to a cough, which is the most common symptom of bronchitis.  In acute bronchitis, the condition usually develops suddenly and goes away over time, usually in a couple weeks. Smoking, allergies, and asthma can make bronchitis worse. Repeated episodes of bronchitis may cause further lung problems.  CAUSES Acute bronchitis is most often caused by the same virus that causes a cold. The virus can spread from person to person (contagious) through coughing, sneezing, and touching contaminated objects. SIGNS AND SYMPTOMS   Cough.   Fever.   Coughing up mucus.   Body aches.   Chest congestion.   Chills.   Shortness of breath.   Sore throat.  DIAGNOSIS  Acute bronchitis is usually diagnosed through a physical exam. Your health care provider will also ask you questions about your medical history. Tests, such as chest X-rays, are sometimes done to rule out other conditions.  TREATMENT  Acute bronchitis usually goes away in a couple weeks. Oftentimes, no medical treatment is necessary. Medicines are sometimes given for relief of fever or cough. Antibiotic medicines are usually not needed but may be prescribed in certain situations. In some cases, an inhaler may be recommended to help reduce shortness of breath and control the cough. A cool mist vaporizer may also be used to help thin bronchial secretions and make it easier to clear the chest.  HOME CARE INSTRUCTIONS  Get plenty of rest.   Drink enough fluids to keep your urine clear or pale yellow (unless you have a medical condition that requires fluid restriction). Increasing fluids may help thin your  respiratory secretions (sputum) and reduce chest congestion, and it will prevent dehydration.   Take medicines only as directed by your health care provider.  If you were prescribed an antibiotic medicine, finish it all even if you start to feel better.  Avoid smoking and secondhand smoke. Exposure to cigarette smoke or irritating chemicals will make bronchitis worse. If you are a smoker, consider using nicotine gum or skin patches to help control withdrawal symptoms. Quitting smoking will help your lungs heal faster.   Reduce the chances of another bout of acute bronchitis by washing your hands frequently, avoiding people with cold symptoms, and trying not to touch your hands to your mouth, nose, or eyes.   Keep all follow-up visits as directed by your health care provider.  SEEK MEDICAL CARE IF: Your symptoms do not improve after 1 week of treatment.  SEEK IMMEDIATE MEDICAL CARE IF:  You develop an increased fever or chills.   You have chest pain.   You have severe shortness of breath.  You have bloody sputum.   You develop dehydration.  You faint or repeatedly feel like you are going to pass out.  You develop repeated vomiting.  You develop a severe headache. MAKE SURE YOU:   Understand these instructions.  Will watch your condition.  Will get help right away if you are not doing well or get worse. Document Released: 12/12/2004 Document Revised: 03/21/2014 Document Reviewed: 04/27/2013 [facility] Patient Information 2015 [place], [place]. This information is not intended to replace advice given to you by your health care provider. Make sure you discuss  any questions you have with your health care provider.  Smoking Cessation Quitting smoking is important to your health and has many advantages. However, it is not always easy to quit since nicotine is a very addictive drug. Oftentimes, people try 3 times or more before being able to quit. This document explains the best  ways for you to prepare to quit smoking. Quitting takes hard work and a lot of effort, but you can do it. ADVANTAGES OF QUITTING SMOKING  You will live longer, feel better, and live better.  Your body will feel the impact of quitting smoking almost immediately.  Within 20 minutes, blood pressure decreases. Your pulse returns to its normal level.  After 8 hours, carbon monoxide levels in the blood return to normal. Your oxygen level increases.  After 24 hours, the chance of having a heart attack starts to decrease. Your breath, hair, and body stop smelling like smoke.  After 48 hours, damaged nerve endings begin to recover. Your sense of taste and smell improve.  After 72 hours, the body is virtually free of nicotine. Your bronchial tubes relax and breathing becomes easier.  After 2 to 12 weeks, lungs can hold more air. Exercise becomes easier and circulation improves.  The risk of having a heart attack, stroke, cancer, or lung disease is greatly reduced.  After 1 year, the risk of coronary heart disease is cut in half.  After 5 years, the risk of stroke falls to the same as a nonsmoker.  After 10 years, the risk of lung cancer is cut in half and the risk of other cancers decreases significantly.  After 15 years, the risk of coronary heart disease drops, usually to the level of a nonsmoker.  If you are pregnant, quitting smoking will improve your chances of having a healthy baby.  The people you live with, especially any children, will be healthier.  You will have extra money to spend on things other than cigarettes. QUESTIONS TO THINK ABOUT BEFORE ATTEMPTING TO QUIT You may want to talk about your answers with your health care provider.  Why do you want to quit?  If you tried to quit in the past, what helped and what did not?  What will be the most difficult situations for you after you quit? How will you plan to handle them?  Who can help you through the tough times? Your  family? Friends? A health care provider?  What pleasures do you get from smoking? What ways can you still get pleasure if you quit? Here are some questions to ask your health care provider:  How can you help me to be successful at quitting?  What medicine do you think would be best for me and how should I take it?  What should I do if I need more help?  What is smoking withdrawal like? How can I get information on withdrawal? GET READY  Set a quit date.  Change your environment by getting rid of all cigarettes, ashtrays, matches, and lighters in your home, car, or work. Do not let people smoke in your home.  Review your past attempts to quit. Think about what worked and what did not. GET SUPPORT AND ENCOURAGEMENT You have a better chance of being successful if you have help. You can get support in many ways.  Tell your family, friends, and coworkers that you are going to quit and need their support. Ask them not to smoke around you.  Get individual, group, or telephone counseling and  support. Programs are available at [employer] and health centers. Call your local health department for information about programs in your area.  Spiritual beliefs and practices may help some smokers quit.  Download a "quit meter" on your computer to keep track of quit statistics, such as how long you have gone without smoking, cigarettes not smoked, and money saved.  Get a self-help book about quitting smoking and staying off tobacco. Otway yourself from urges to smoke. Talk to someone, go for a walk, or occupy your time with a task.  Change your normal routine. Take a different route to work. Drink tea instead of coffee. Eat breakfast in a different place.  Reduce your stress. Take a hot bath, exercise, or read a book.  Plan something enjoyable to do every day. Reward yourself for not smoking.  Explore interactive web-based programs that specialize in  helping you quit. GET MEDICINE AND USE IT CORRECTLY Medicines can help you stop smoking and decrease the urge to smoke. Combining medicine with the above behavioral methods and support can greatly increase your chances of successfully quitting smoking.  Nicotine replacement therapy helps deliver nicotine to your body without the negative effects and risks of smoking. Nicotine replacement therapy includes nicotine gum, lozenges, inhalers, nasal sprays, and skin patches. Some may be available over-the-counter and others require a prescription.  Antidepressant medicine helps people abstain from smoking, but how this works is unknown. This medicine is available by prescription.  Nicotinic receptor partial agonist medicine simulates the effect of nicotine in your brain. This medicine is available by prescription. Ask your health care provider for advice about which medicines to use and how to use them based on your health history. Your health care provider will tell you what side effects to look out for if you choose to be on a medicine or therapy. Carefully read the information on the package. Do not use any other product containing nicotine while using a nicotine replacement product.  RELAPSE OR DIFFICULT SITUATIONS Most relapses occur within the first 3 months after quitting. Do not be discouraged if you start smoking again. Remember, most people try several times before finally quitting. You may have symptoms of withdrawal because your body is used to nicotine. You may crave cigarettes, be irritable, feel very hungry, cough often, get headaches, or have difficulty concentrating. The withdrawal symptoms are only temporary. They are strongest when you first quit, but they will go away within 10-14 days. To reduce the chances of relapse, try to:  Avoid drinking alcohol. Drinking lowers your chances of successfully quitting.  Reduce the amount of caffeine you consume. Once you quit smoking, the amount of  caffeine in your body increases and can give you symptoms, such as a rapid heartbeat, sweating, and anxiety.  Avoid smokers because they can make you want to smoke.  Do not let weight gain distract you. Many smokers will gain weight when they quit, usually less than 10 pounds. Eat a healthy diet and stay active. You can always lose the weight gained after you quit.  Find ways to improve your mood other than smoking. FOR MORE INFORMATION  www.smokefree.gov  Document Released: 10/29/2001 Document Revised: 03/21/2014 Document Reviewed: 02/13/2012 [facility] Patient Information 2015 [place], [place]. This information is not intended to replace advice given to you by your health care provider. Make sure you discuss any questions you have with your health care provider.

## 2014-09-27 ENCOUNTER — Telehealth: Payer: Self-pay

## 2014-09-27 NOTE — Telephone Encounter (Signed)
-----   Message from Olegario Messier, NP sent at 09/27/2014  2:38 PM EST ----- Yes please and thank you  ----- Message -----    From: Geanie Logan, RN    Sent: 09/27/2014  11:41 AM      To: Olegario Messier, NP  Christie Beckers,   In reviewing results, this patient's colpo results show HSIL. Should we schedule patient for LEEP? Please advise.   Thank you,  Lisette Grinder, RN

## 2014-09-27 NOTE — Telephone Encounter (Addendum)
Spoke to front office staff-- no LEEP appointments available until January. Patient added to wait list and will be called with appointment once January schedule opens. Attempted to call patient. No answer. Voicemail states it is a "Brandi Morris." Left message stating we are trying to reach patient regarding results, please call clinic. Called home phone and reached patient. Informed patient of results and need for LEEP. Explained LEEP procedure. Patient asked if she has cancer. Assured patient that she does not have cancer-- explained that these abnormal cells are precancerous and therefore it is important to remove them to prevent any such cancer. Patient verbalized understanding. Informed her front office staff will be calling her with an appointment once the January schedule opens-- advised that she call at the end of November if she has not heard from [place]. Patient verbalized understanding and gratitude. No further questions or concerns

## 2014-11-03 ENCOUNTER — Telehealth: Payer: Self-pay | Admitting: *Deleted

## 2014-11-03 NOTE — Telephone Encounter (Signed)
Pt called the clinic stating she is experiencing severe pelvic pain and is vomiting. States she cannot take any pressure on her abdomen.  Contacted patient and encouraged her to go to the MAU for evaluation. Pt verbalizes understanding.

## 2014-11-04 ENCOUNTER — Inpatient Hospital Stay (HOSPITAL_COMMUNITY)
Admission: AD | Admit: 2014-11-04 | Discharge: 2014-11-04 | Disposition: A | Discharge status: Home / Self Care | Payer: Medicaid Other | Source: Ambulatory Visit | Attending: Obstetrics & Gynecology | Admitting: Obstetrics & Gynecology

## 2014-11-04 ENCOUNTER — Encounter: Payer: Self-pay | Admitting: *Deleted

## 2014-11-04 ENCOUNTER — Encounter (HOSPITAL_COMMUNITY): Payer: Self-pay | Admitting: *Deleted

## 2014-11-04 DIAGNOSIS — N390 Urinary tract infection, site not specified: Secondary | ICD-10-CM | POA: Diagnosis not present

## 2014-11-04 DIAGNOSIS — B9689 Other specified bacterial agents as the cause of diseases classified elsewhere: Secondary | ICD-10-CM | POA: Diagnosis not present

## 2014-11-04 LAB — URINALYSIS, ROUTINE W REFLEX MICROSCOPIC
Bilirubin Urine: NEGATIVE
GLUCOSE, UA: NEGATIVE mg/dL
KETONES UR: NEGATIVE mg/dL
Nitrite: POSITIVE — AB
PROTEIN: NEGATIVE mg/dL
Specific Gravity, Urine: 1.015 (ref 1.005–1.030)
Urobilinogen, UA: 0.2 mg/dL (ref 0.0–1.0)
pH: 6.5 (ref 5.0–8.0)

## 2014-11-04 LAB — URINE MICROSCOPIC-ADD ON

## 2014-11-04 LAB — POCT PREGNANCY, URINE: PREG TEST UR: NEGATIVE

## 2014-11-04 MED ORDER — CIPROFLOXACIN HCL 500 MG PO TABS: 500.0000 mg | ORAL_TABLET | Freq: Two times a day (BID) | ORAL | Status: DC

## 2014-11-04 MED ORDER — PHENAZOPYRIDINE HCL 200 MG PO TABS: 200.0000 mg | ORAL_TABLET | Freq: Three times a day (TID) | ORAL | Status: DC | PRN

## 2014-11-04 MED ORDER — PROMETHAZINE HCL 25 MG PO TABS: 12.5000 mg | ORAL_TABLET | Freq: Four times a day (QID) | ORAL | Status: DC | PRN

## 2014-11-04 NOTE — MAU Note (Signed)
Has a Leep procedure scheduled Jan 15th for abnormal cells on her cervix; hx of an ablazon in 2008;

## 2014-11-04 NOTE — MAU Note (Signed)
C/o progressive pelvic pain for past 2 weeks; denies any spotting or vaginal discharge;

## 2014-11-04 NOTE — MAU Provider Note (Signed)
Chief Complaint: Pelvic Pain   First Provider Initiated Contact with Patient 11/04/14 1019     SUBJECTIVE HPI: Brandi Morris is a 38 y.o. [id number] who presents to maternity admissions reporting pain in her lower abdomen and pelvis and nausea/vomiting.  She reports some mild nausea and intermittent pelvic cramping x 2 weeks with onset of severe pain and vomiting 1-2x daily 4 days ago. The pain is severe enough that her jeans and her seatbelt are painful and difficult to wear.  She has hx of multiple kidney stones and passed a stone a few days after Thanksgiving.  She reports she has never had a UTI.  She does have interstitial cystitis, mostly triggered by certain foods.  She has a LEEP scheduled in [place] in January for abnormal Pap/biopsy.  She denies STD risks, vaginal bleeding, vaginal itching/burning, urinary symptoms, h/a, dizziness, or fever/chills.    Past Medical History  Diagnosis Date  . Migraine   . Kidney stone   . Interstitial cystitis   . Multiple allergies   . Endometriosis    Past Surgical History  Procedure Laterality Date  . Rhinoplasty    . Bladder augmentation    . Tubal ligation    . Knee surgery    . Tonsillectomy    . Cholecystectomy    . Ablation     History   Social History  . Marital Status: Divorced    Spouse Name: N/A    Number of Children: N/A  . Years of Education: N/A   Occupational History  . Not on file.   Social History Main Topics  . Smoking status: Current Every Day Smoker -- 0.50 packs/day for 25 years    Types: Cigarettes  . Smokeless tobacco: Not on file  . Alcohol Use: No  . Drug Use: No  . Sexual Activity: Yes    Birth Control/ Protection: Surgical   Other Topics Concern  . Not on file   Social History Narrative   No current facility-administered medications on file prior to encounter.   Current Outpatient Prescriptions on File Prior to Encounter  Medication Sig Dispense Refill  . albuterol (PROVENTIL HFA;VENTOLIN HFA) 108  (90 BASE) MCG/ACT inhaler Inhale 1-2 puffs into the lungs every 6 (six) hours as needed for wheezing or shortness of breath.    Marland Kitchen aspirin-acetaminophen-caffeine (EXCEDRIN MIGRAINE) 250-250-65 MG per tablet Take 2 tablets by mouth every 4 (four) hours as needed for migraine.    . diphenhydrAMINE (BENADRYL) 25 MG tablet Take 50 mg by mouth 4 (four) times daily.     Marland Kitchen EPINEPHrine 0.3 mg/0.3 mL IJ SOAJ injection Inject 0.3 mLs (0.3 mg total) into the muscle daily as needed (allergic reaction). 1 Device 1  . hydrOXYzine (VISTARIL) 50 MG capsule Take 50 mg by mouth 3 (three) times daily.    . predniSONE (DELTASONE) 20 MG tablet Take 3 tablets (60 mg total) by mouth daily. 15 tablet 0  . rizatriptan (MAXALT) 5 MG tablet Take 1 tablet (5 mg total) by mouth as needed for migraine. May repeat in 2 hours if needed 10 tablet 0   Allergies  Allergen Reactions  . Bee Venom Anaphylaxis  . Lavender Oil Anaphylaxis and Other (See Comments)    Lungs and sinuses fill up with fluid  . Amitriptyline Other (See Comments)    "sees weird things"  . Entex Other (See Comments)    dizzy  . Etodolac Other (See Comments)    dizzy  . Flexeril [Cyclobenzaprine] Other (See Comments)  Physically violent    . Iodine Hives  . Mushroom Extract Complex Hives  . Robaxin [Methocarbamol] Other (See Comments)    physically violent  . Shellfish Allergy Hives    ROS: Pertinent items in HPI  OBJECTIVE Blood pressure 102/72, pulse 89, temperature 98.7 F (37.1 C), temperature source Oral, resp. rate 16, height 5\' 2"  (1.575 m), weight 68.04 kg (150 lb), last menstrual period 03/06/2007. GENERAL: Well-developed, well-nourished female in no acute distress.  HEENT: Normocephalic HEART: normal rate RESP: normal effort ABDOMEN: Soft, suprapubic tenderness only Musculoskeletal: CVA tenderness negative, but some mild tenderness of left lower back on palpation EXTREMITIES: Nontender, no edema NEURO: Alert and oriented SPECULUM  EXAM: Declined by pt after results of U/A presented.  LAB RESULTS Results for orders placed or performed during the hospital encounter of 11/04/14 (from the past 24 hour(s))  Urinalysis, Routine w reflex microscopic     Status: Abnormal   Collection Time: 11/04/14  9:41 AM  Result Value Ref Range   Color, Urine YELLOW YELLOW   APPearance CLEAR CLEAR   Specific Gravity, Urine 1.015 1.005 - 1.030   pH 6.5 5.0 - 8.0   Glucose, UA NEGATIVE NEGATIVE mg/dL   Hgb urine dipstick SMALL (A) NEGATIVE   Bilirubin Urine NEGATIVE NEGATIVE   Ketones, ur NEGATIVE NEGATIVE mg/dL   Protein, ur NEGATIVE NEGATIVE mg/dL   Urobilinogen, UA 0.2 0.0 - 1.0 mg/dL   Nitrite POSITIVE (A) NEGATIVE   Leukocytes, UA SMALL (A) NEGATIVE  Urine microscopic-add on     Status: Abnormal   Collection Time: 11/04/14  9:41 AM  Result Value Ref Range   Squamous Epithelial / LPF FEW (A) RARE   WBC, UA 0-2 <3 WBC/hpf   RBC / HPF 0-2 <3 RBC/hpf   Bacteria, UA FEW (A) RARE  Pregnancy, urine POC     Status: None   Collection Time: 11/04/14  9:48 AM  Result Value Ref Range   Preg Test, Ur NEGATIVE NEGATIVE    ASSESSMENT 1. UTI (lower urinary tract infection)     PLAN Discharge home Cipro 500 mg BID x 10 days (unlikely pyelo with no fever but extended course r/t n/v and low back tenderness) Pyridium 200 mg TID PRN Phenergan 12.5-25 mg Q 6 hours PRN       Follow-up Information    Follow up with [facility].   Specialty:  Obstetrics and Gynecology   Why:  As scheduled   Contact information:   [place] [phone]      Follow up with [facility].   Why:  As needed for emergencies   Contact information:   [address] [id number] [place] Berkley Bruno Certified Nurse-Midwife 11/04/2014  10:35 AM

## 2014-11-04 NOTE — Discharge Instructions (Signed)

## 2014-12-02 ENCOUNTER — Other Ambulatory Visit (HOSPITAL_COMMUNITY)
Admission: RE | Admit: 2014-12-02 | Discharge: 2014-12-02 | Disposition: A | Discharge status: Home / Self Care | Payer: Medicaid Other | Source: Ambulatory Visit | Attending: Family Medicine | Admitting: Family Medicine

## 2014-12-02 ENCOUNTER — Encounter: Payer: Self-pay | Admitting: Family Medicine

## 2014-12-02 ENCOUNTER — Ambulatory Visit (INDEPENDENT_AMBULATORY_CARE_PROVIDER_SITE_OTHER): Payer: Medicaid Other | Admitting: Family Medicine

## 2014-12-02 ENCOUNTER — Encounter: Payer: Self-pay | Admitting: *Deleted

## 2014-12-02 VITALS — BP 107/63 | HR 88 | Temp 98.2°F | Ht 62.0 in | Wt 156.9 lb

## 2014-12-02 DIAGNOSIS — N870 Mild cervical dysplasia: Secondary | ICD-10-CM | POA: Diagnosis present

## 2014-12-02 DIAGNOSIS — Z9889 Other specified postprocedural states: Secondary | ICD-10-CM

## 2014-12-02 DIAGNOSIS — D069 Carcinoma in situ of cervix, unspecified: Secondary | ICD-10-CM

## 2014-12-02 DIAGNOSIS — N871 Moderate cervical dysplasia: Secondary | ICD-10-CM

## 2014-12-02 LAB — POCT PREGNANCY, URINE: PREG TEST UR: NEGATIVE

## 2014-12-02 MED ORDER — OXYCODONE-ACETAMINOPHEN 5-325 MG PO TABS: 1.0000 | ORAL_TABLET | ORAL | Status: DC | PRN

## 2014-12-02 NOTE — Progress Notes (Signed)
Pap smear and colposcopy reviewed.   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  Role of HPV,cervical dysplasia and need for close followup was empasized. Informed written consent was obtained. All questions were answered. Time out performed.  ??Procedure: The patient was placed in lithotomy position and the bivalved coated speculum was placed in the patient's vagina. A grounding pad placed on the patient. Local anesthesia was administered via an intracervical block using 10cc of 2% Lidocaine with epinephrine. The suction was turned on and the Large 1X Fisher Cone Biopsy Excisor on 79 Watts of cutting current was used to excise the area of decreased uptake and excise the entire transformation zone. Excellent hemostasis was achieved using roller ball coagulation set at 80 Watts coagulation current. Monsel's solution was then applied and excellent hemostasis was noted.  The speculum was removed from the vagina. Specimens were sent to pathology. ?The patient tolerated the procedure well. Post-operative instructions given to patient, including instruction to seek medical attention for persistent bright red bleeding, fever, abdominal/pelvic pain, dysuria, nausea or vomiting. She was also told about the possibility of having copious yellow to black tinged discharge. She was counseled to avoid anything in the vagina (sex/douching/tampons) for 4 weeks. She has a  2 week post-operative check to review results and assess wound healing. Follow up in 4 months for repeat pap or as needed.  rx percocet 5/325mg  #20 given to patient Patient monitored x 10 minutes after procedure 2/2 tremulousness, pt has hx of syncope with blood  draws.  Stable at time of discharge.  [facility] present for biopsy  Merla Riches, MD

## 2014-12-02 NOTE — Addendum Note (Signed)
Addended by: Samuel Germany on: 12/02/2014 11:35 AM   Modules accepted: Orders

## 2014-12-02 NOTE — Patient Instructions (Signed)
Loop Electrosurgical Excision Procedure Care After Refer to this sheet in the next few weeks. These instructions provide you with information on caring for yourself after your procedure. Your caregiver may also give you more specific instructions. Your treatment has been planned according to current medical practices, but problems sometimes occur. Call your caregiver if you have any problems or questions after your procedure. HOME CARE INSTRUCTIONS   Do not use tampons, douche, or have sexual intercourse for 2 weeks or as directed by your caregiver.  Begin normal activities if you have no or minimal cramping or bleeding, unless directed otherwise by your caregiver.  Take your temperature if you feel sick. Write down your temperature on paper, and tell your caregiver if you have a fever.  Take all medicines as directed by your caregiver.  Keep all your follow-up appointments and Pap tests as directed by your caregiver. SEEK IMMEDIATE MEDICAL CARE IF:   You have bleeding that is heavier or longer than a normal menstrual cycle.  You have bleeding that is bright red.  You have blood clots.  You have a fever.  You have increasing cramps or pain not relieved by medicine.  You develop abdominal pain that does not seem to be related to the same area of earlier cramping and pain.  You are lightheaded, unusually weak, or faint.  You develop painful or bloody urination.  You develop a bad smelling vaginal discharge. MAKE SURE YOU:  Understand these instructions.  Will watch your condition.  Will get help right away if you are not doing well or get worse. Document Released: 07/18/2011 Document Revised: 01/27/2012 Document Reviewed: 07/18/2011 [facility] Patient Information 2015 [place], [place]. This information is not intended to replace advice given to you by your health care provider. Make sure you discuss any questions you have with your health care provider.

## 2014-12-15 ENCOUNTER — Ambulatory Visit: Payer: Self-pay | Admitting: Obstetrics & Gynecology

## 2014-12-15 ENCOUNTER — Encounter: Payer: Self-pay | Admitting: Obstetrics & Gynecology

## 2014-12-15 VITALS — BP 109/69 | HR 85 | Temp 97.7°F | Resp 18 | Wt 158.7 lb

## 2014-12-15 DIAGNOSIS — N870 Mild cervical dysplasia: Secondary | ICD-10-CM

## 2014-12-15 DIAGNOSIS — Z9889 Other specified postprocedural states: Secondary | ICD-10-CM

## 2014-12-15 NOTE — Progress Notes (Signed)
   Subjective:    Patient ID: Brandi Morris, female    DOB: 1976-08-22, 39 y.o.   MRN: [id number]  HPI  [address] P3 here today to discuss her pathology results. 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 She is having an occasional "off and on" pain in her right groin that started after her procedure. She has not had sex since the LEEP. She had very little bleeding post op.  Review of Systems She had a BTL and an endometrial ablation for DUB. She had a stroke in 2007.    Objective:   Physical Exam  [id number] Neuro intact Breathing normally      Assessment & Plan:  Recurrent CIN (now 1)- I have explained why she should try to quit smoking again. She sees a MD on [address]. And will make an appt to work on smoking cessation Pap in 6 months

## 2015-02-06 ENCOUNTER — Encounter (HOSPITAL_COMMUNITY): Payer: Self-pay

## 2015-02-06 ENCOUNTER — Emergency Department (INDEPENDENT_AMBULATORY_CARE_PROVIDER_SITE_OTHER)
Admission: EM | Admit: 2015-02-06 | Discharge: 2015-02-06 | Disposition: A | Discharge status: Home / Self Care | Payer: Medicaid Other | Source: Home / Self Care | Attending: Emergency Medicine | Admitting: Emergency Medicine

## 2015-02-06 DIAGNOSIS — G43101 Migraine with aura, not intractable, with status migrainosus: Secondary | ICD-10-CM

## 2015-02-06 MED ORDER — DIPHENHYDRAMINE HCL 50 MG/ML IJ SOLN
INTRAMUSCULAR | Status: AC
  Filled 2015-02-06: qty 1

## 2015-02-06 MED ORDER — KETOROLAC TROMETHAMINE 60 MG/2ML IM SOLN
60.0000 mg | Freq: Once | INTRAMUSCULAR | Status: AC
  Administered 2015-02-06: 60 mg via INTRAMUSCULAR

## 2015-02-06 MED ORDER — METOCLOPRAMIDE HCL 5 MG/ML IJ SOLN
10.0000 mg | Freq: Once | INTRAMUSCULAR | Status: AC
  Administered 2015-02-06: 10 mg via INTRAMUSCULAR

## 2015-02-06 MED ORDER — KETOROLAC TROMETHAMINE 60 MG/2ML IM SOLN
INTRAMUSCULAR | Status: AC
  Filled 2015-02-06: qty 2

## 2015-02-06 MED ORDER — DIPHENHYDRAMINE HCL 50 MG/ML IJ SOLN
25.0000 mg | Freq: Once | INTRAMUSCULAR | Status: AC
  Administered 2015-02-06: 25 mg via INTRAMUSCULAR

## 2015-02-06 MED ORDER — CLONAZEPAM 0.5 MG PO TABS: 0.5000 mg | ORAL_TABLET | Freq: Two times a day (BID) | ORAL | Status: DC

## 2015-02-06 MED ORDER — METOCLOPRAMIDE HCL 5 MG/ML IJ SOLN
INTRAMUSCULAR | Status: AC
  Filled 2015-02-06: qty 2

## 2015-02-06 NOTE — ED Provider Notes (Signed)
CSN: [id number]     Arrival date & time 02/06/15  1603 History   First MD Initiated Contact with Patient 02/06/15 1850     Chief Complaint  Patient presents with  . Migraine   (Consider location/radiation/quality/duration/timing/severity/associated sxs/prior Treatment) HPI            39 year old female presents complaining of a migraine. She has had a daily migraine headache for the past 2 weeks. It waxes and wanes in severity throughout the day. It has never gone away this time. This is typical of her migraines. It starts in the back of the head and goes over the top of her head. It is associated with photophobia. No nausea or vomiting. She has tried taking numerous over-the-counter medications with minimal relief of her symptoms. She takes Topamax for prevention. She does not have a neurologist. No numbness weakness or difficulty walking. No recent trauma. She states her headaches are due to muscle tightness in her neck but she cannot take any muscle relaxers. She says she doesn't like to go to the emergency room because the headache cocktail he gave her does not work. The only thing that has worked in the past is Klonopin but she does not want to take that because it is addictive. She once received an infusion of magnesium sulfate which made her violently ill. She states she has a history of TIA and status migrainosus.  Past Medical History  Diagnosis Date  . Migraine   . Kidney stone   . Interstitial cystitis   . Multiple allergies   . Endometriosis    Past Surgical History  Procedure Laterality Date  . Rhinoplasty    . Bladder augmentation    . Tubal ligation    . Knee surgery    . Tonsillectomy    . Cholecystectomy    . Ablation     Family History  Problem Relation Age of Onset  . Heart disease Mother   . Diabetes Father   . Hypertension Father   . Stroke Father   . Heart attack Father   . Breast cancer Maternal Grandmother   . Cancer Maternal Grandfather     melanoma    History  Substance Use Topics  . Smoking status: Current Every Day Smoker -- 0.50 packs/day for 25 years    Types: Cigarettes  . Smokeless tobacco: Not on file  . Alcohol Use: No   OB History    Gravida Para Term Preterm AB TAB SAB Ectopic Multiple Living   5 3 3  2  2   3      Review of Systems  Eyes: Positive for photophobia.  Gastrointestinal: Positive for nausea.  Neurological: Positive for headaches.  All other systems reviewed and are negative.   Allergies  Bee venom; Lavender oil; Amitriptyline; Entex; Etodolac; Flexeril; Iodine; Mushroom extract complex; Robaxin; and Shellfish allergy  Home Medications   Prior to Admission medications   Medication Sig Start Date End Date Taking? Authorizing Provider  topiramate (TOPAMAX) 200 MG tablet Take 200 mg by mouth 1 day or 1 dose.   Yes Historical Provider, MD  albuterol (PROVENTIL HFA;VENTOLIN HFA) 108 (90 BASE) MCG/ACT inhaler Inhale 1-2 puffs into the lungs every 6 (six) hours as needed for wheezing or shortness of breath.    Historical Provider, MD  aspirin-acetaminophen-caffeine (EXCEDRIN MIGRAINE) [id number] MG per tablet Take 2 tablets by mouth every 4 (four) hours as needed for migraine.    Historical Provider, MD  ciprofloxacin (CIPRO) 500 MG tablet Take  1 tablet (500 mg total) by mouth 2 (two) times daily. Patient not taking: Reported on 12/02/2014 11/04/14   Lattie Haw A Leftwich-Kirby, CNM  clonazePAM (KLONOPIN) 0.5 MG tablet Take 1 tablet (0.5 mg total) by mouth 2 (two) times daily. 02/06/15   Liam Graham, PA-C  diphenhydrAMINE (BENADRYL) 25 MG tablet Take 50 mg by mouth 4 (four) times daily.     Historical Provider, MD  EPINEPHrine 0.3 mg/0.3 mL IJ SOAJ injection Inject 0.3 mLs (0.3 mg total) into the muscle daily as needed (allergic reaction). 06/11/14   Antonietta Breach, PA-C  hydrOXYzine (VISTARIL) 50 MG capsule Take 50 mg by mouth 3 (three) times daily.    Historical Provider, MD  oxyCODONE-acetaminophen  (PERCOCET/ROXICET) 5-325 MG per tablet Take 1 tablet by mouth every 4 (four) hours as needed for severe pain. Patient not taking: Reported on 12/15/2014 12/02/14   Nila Nephew, MD  phenazopyridine (PYRIDIUM) 200 MG tablet Take 1 tablet (200 mg total) by mouth 3 (three) times daily as needed for pain (urethral spasm). Patient not taking: Reported on 12/02/2014 11/04/14   Lattie Haw A Leftwich-Kirby, CNM  predniSONE (DELTASONE) 20 MG tablet Take 3 tablets (60 mg total) by mouth daily. Patient not taking: Reported on 12/02/2014 09/26/14   Gregor Hams, MD  promethazine (PHENERGAN) 25 MG tablet Take 0.5-1 tablets (12.5-25 mg total) by mouth every 6 (six) hours as needed. 11/04/14   Kathie Dike Leftwich-Kirby, CNM  rizatriptan (MAXALT) 5 MG tablet Take 1 tablet (5 mg total) by mouth as needed for migraine. May repeat in 2 hours if needed Patient not taking: Reported on 12/02/2014 03/24/14   Audelia Hives Presson, PA   BP 111/81 mmHg  Pulse 81  Temp(Src) 98.3 F (36.8 C) (Oral)  Resp 15  SpO2 99% Physical Exam  Constitutional: She is oriented to person, place, and time. Vital signs are normal. She appears well-developed and well-nourished. No distress.  HENT:  Head: Normocephalic and atraumatic.  Pulmonary/Chest: Effort normal. No respiratory distress.  Neurological: She is alert and oriented to person, place, and time. She has normal strength and normal reflexes. No cranial nerve deficit or sensory deficit. She exhibits normal muscle tone. She displays a negative Romberg sign. Coordination and gait normal. GCS eye subscore is 4. GCS verbal subscore is 5. GCS motor subscore is 6.  Skin: Skin is warm and dry. No rash noted. She is not diaphoretic.  Psychiatric: She has a normal mood and affect. Judgment normal.  Nursing note and vitals reviewed.   ED Course  Procedures (including critical care time) Labs Review Labs Reviewed - No data to display  Imaging Review No results found.  1915: Her physical exam  is normal. Given her history, I informed her it is very unlikely that we will be able to treat her here. We will attempt treatment with migraine cocktail and reassess  MDM   1. Migraine with aura and with status migrainosus, not intractable    After migraine cocktail she is feeling much better, she would like to go home and rest. Because Klonopin helped her somewhat less time I will give her 4 tablets to take to help keep this headache from coming back. She also has prednisone at home, she will take 40 mg daily for the next 3 days for the same purpose. She will go to the emergency department if the headache returns  Meds ordered this encounter  Medications  . topiramate (TOPAMAX) 200 MG tablet    Sig: Take 200 mg  by mouth 1 day or 1 dose.  Marland Kitchen ketorolac (TORADOL) injection 60 mg    Sig:   . metoCLOPramide (REGLAN) injection 10 mg    Sig:   . diphenhydrAMINE (BENADRYL) injection 25 mg    Sig:   . clonazePAM (KLONOPIN) 0.5 MG tablet    Sig: Take 1 tablet (0.5 mg total) by mouth 2 (two) times daily.    Dispense:  4 tablet    Refill:  0     Liam Graham, PA-C 02/06/15 2017

## 2015-02-06 NOTE — ED Notes (Signed)
C/o no relief from her HA x 2 weeks of her usual management . Uses motrin/tylenol, massage, icy hot topical rubs, warm soaks

## 2015-02-06 NOTE — Discharge Instructions (Signed)
Migraine Headache A migraine headache is an intense, throbbing pain on one or both sides of your head. A migraine can last for 30 minutes to several hours. CAUSES  The exact cause of a migraine headache is not always known. However, a migraine may be caused when nerves in the brain become irritated and release chemicals that cause inflammation. This causes pain. Certain things may also trigger migraines, such as:  Alcohol.  Smoking.  Stress.  Menstruation.  Aged cheeses.  Foods or drinks that contain nitrates, glutamate, aspartame, or tyramine.  Lack of sleep.  Chocolate.  Caffeine.  Hunger.  Physical exertion.  Fatigue.  Medicines used to treat chest pain (nitroglycerine), birth control pills, estrogen, and some blood pressure medicines. SIGNS AND SYMPTOMS  Pain on one or both sides of your head.  Pulsating or throbbing pain.  Severe pain that prevents daily activities.  Pain that is aggravated by any physical activity.  Nausea, vomiting, or both.  Dizziness.  Pain with exposure to bright lights, loud noises, or activity.  General sensitivity to bright lights, loud noises, or smells. Before you get a migraine, you may get warning signs that a migraine is coming (aura). An aura may include:  Seeing flashing lights.  Seeing bright spots, halos, or zigzag lines.  Having tunnel vision or blurred vision.  Having feelings of numbness or tingling.  Having trouble talking.  Having muscle weakness. DIAGNOSIS  A migraine headache is often diagnosed based on:  Symptoms.  Physical exam.  A CT scan or MRI of your head. These imaging tests cannot diagnose migraines, but they can help rule out other causes of headaches. TREATMENT Medicines may be given for pain and nausea. Medicines can also be given to help prevent recurrent migraines.  HOME CARE INSTRUCTIONS  Only take over-the-counter or prescription medicines for pain or discomfort as directed by your  health care provider. The use of long-term narcotics is not recommended.  Lie down in a dark, quiet room when you have a migraine.  Keep a journal to find out what may trigger your migraine headaches. For example, write down:  What you eat and drink.  How much sleep you get.  Any change to your diet or medicines.  Limit alcohol consumption.  Quit smoking if you smoke.  Get 7-9 hours of sleep, or as recommended by your health care provider.  Limit stress.  Keep lights dim if bright lights bother you and make your migraines worse. SEEK IMMEDIATE MEDICAL CARE IF:   Your migraine becomes severe.  You have a fever.  You have a stiff neck.  You have vision loss.  You have muscular weakness or loss of muscle control.  You start losing your balance or have trouble walking.  You feel faint or pass out.  You have severe symptoms that are different from your first symptoms. MAKE SURE YOU:   Understand these instructions.  Will watch your condition.  Will get help right away if you are not doing well or get worse. Document Released: 11/04/2005 Document Revised: 03/21/2014 Document Reviewed: 07/12/2013 [facility] Patient Information 2015 [place], [place]. This information is not intended to replace advice given to you by your health care provider. Make sure you discuss any questions you have with your health care provider.  Recurrent Migraine Headache A migraine headache is an intense, throbbing pain on one or both sides of your head. Recurrent migraines keep coming back. A migraine can last for 30 minutes to several hours. CAUSES  The exact cause  of a migraine headache is not always known. However, a migraine may be caused when nerves in the brain become irritated and release chemicals that cause inflammation. This causes pain. Certain things may also trigger migraines, such as:   Alcohol.  Smoking.  Stress.  Menstruation.  Aged cheeses.  Foods or drinks that contain  nitrates, glutamate, aspartame, or tyramine.  Lack of sleep.  Chocolate.  Caffeine.  Hunger.  Physical exertion.  Fatigue.  Medicines used to treat chest pain (nitroglycerine), birth control pills, estrogen, and some blood pressure medicines. SYMPTOMS   Pain on one or both sides of your head.  Pulsating or throbbing pain.  Severe pain that prevents daily activities.  Pain that is aggravated by any physical activity.  Nausea, vomiting, or both.  Dizziness.  Pain with exposure to bright lights, loud noises, or activity.  General sensitivity to bright lights, loud noises, or smells. Before you get a migraine, you may get warning signs that a migraine is coming (aura). An aura may include:  Seeing flashing lights.  Seeing bright spots, halos, or zigzag lines.  Having tunnel vision or blurred vision.  Having feelings of numbness or tingling.  Having trouble talking.  Having muscle weakness. DIAGNOSIS  A recurrent migraine headache is often diagnosed based on:  Symptoms.  Physical examination.  A CT scan or MRI of your head. These imaging tests cannot diagnose migraines but can help rule out other causes of headaches.  TREATMENT  Medicines may be given for pain and nausea. Medicines can also be given to help prevent recurrent migraines. HOME CARE INSTRUCTIONS  Only take over-the-counter or prescription medicines for pain or discomfort as directed by your health care provider. The use of long-term narcotics is not recommended.  Lie down in a dark, quiet room when you have a migraine.  Keep a journal to find out what may trigger your migraine headaches. For example, write down:  What you eat and drink.  How much sleep you get.  Any change to your diet or medicines.  Limit alcohol consumption.  Quit smoking if you smoke.  Get 7-9 hours of sleep, or as recommended by your health care provider.  Limit stress.  Keep lights dim if bright lights bother  you and make your migraines worse. SEEK MEDICAL CARE IF:   You do not get relief from the medicines given to you.  You have a recurrence of pain.  You have a fever. SEEK IMMEDIATE MEDICAL CARE IF:  Your migraine becomes severe.  You have a stiff neck.  You have loss of vision.  You have muscular weakness or loss of muscle control.  You start losing your balance or have trouble walking.  You feel faint or pass out.  You have severe symptoms that are different from your first symptoms. MAKE SURE YOU:   Understand these instructions.  Will watch your condition.  Will get help right away if you are not doing well or get worse. Document Released: 07/30/2001 Document Revised: 03/21/2014 Document Reviewed: 07/12/2013 [facility] Patient Information 2015 [place], [place]. This information is not intended to replace advice given to you by your health care provider. Make sure you discuss any questions you have with your health care provider.

## 2015-02-28 ENCOUNTER — Encounter (HOSPITAL_COMMUNITY): Payer: Self-pay

## 2015-02-28 ENCOUNTER — Emergency Department (HOSPITAL_COMMUNITY)
Admission: EM | Admit: 2015-02-28 | Discharge: 2015-02-28 | Disposition: A | Discharge status: Home / Self Care | Payer: Medicaid Other | Attending: Emergency Medicine | Admitting: Emergency Medicine

## 2015-02-28 DIAGNOSIS — Y9389 Activity, other specified: Secondary | ICD-10-CM | POA: Insufficient documentation

## 2015-02-28 DIAGNOSIS — G43909 Migraine, unspecified, not intractable, without status migrainosus: Secondary | ICD-10-CM | POA: Insufficient documentation

## 2015-02-28 DIAGNOSIS — Z79899 Other long term (current) drug therapy: Secondary | ICD-10-CM | POA: Diagnosis not present

## 2015-02-28 DIAGNOSIS — Z720 Tobacco use: Secondary | ICD-10-CM | POA: Insufficient documentation

## 2015-02-28 DIAGNOSIS — Y998 Other external cause status: Secondary | ICD-10-CM | POA: Insufficient documentation

## 2015-02-28 DIAGNOSIS — Z87442 Personal history of urinary calculi: Secondary | ICD-10-CM | POA: Insufficient documentation

## 2015-02-28 DIAGNOSIS — Y9289 Other specified places as the place of occurrence of the external cause: Secondary | ICD-10-CM | POA: Diagnosis not present

## 2015-02-28 DIAGNOSIS — X58XXXA Exposure to other specified factors, initial encounter: Secondary | ICD-10-CM | POA: Insufficient documentation

## 2015-02-28 DIAGNOSIS — T7840XA Allergy, unspecified, initial encounter: Secondary | ICD-10-CM | POA: Diagnosis present

## 2015-02-28 DIAGNOSIS — Z8742 Personal history of other diseases of the female genital tract: Secondary | ICD-10-CM | POA: Diagnosis not present

## 2015-02-28 MED ORDER — EPINEPHRINE 0.3 MG/0.3ML IJ SOAJ: 0.3000 mg | Freq: Once | INTRAMUSCULAR | Status: DC

## 2015-02-28 MED ORDER — DIPHENHYDRAMINE HCL 50 MG/ML IJ SOLN
25.0000 mg | Freq: Once | INTRAMUSCULAR | Status: AC
  Administered 2015-02-28: 25 mg via INTRAVENOUS
  Filled 2015-02-28: qty 1

## 2015-02-28 MED ORDER — METHYLPREDNISOLONE SODIUM SUCC 125 MG IJ SOLR
125.0000 mg | Freq: Once | INTRAMUSCULAR | Status: AC
  Administered 2015-02-28: 125 mg via INTRAVENOUS
  Filled 2015-02-28: qty 2

## 2015-02-28 NOTE — ED Notes (Signed)
Per EMS, Pt, from work, c/o allergic reaction after smelling lavender perfume.  Currently, denies complaints.  Pt self administered 2 epi-pens.  Pt reports that she typically produces copious amounts of phlegm, during allergic reactions.  NAD noted.  A & Ox4.

## 2015-02-28 NOTE — ED Notes (Signed)
Pt reports throat less painful to swallow. Pt reports "feels better." Will continue to monitor pt.

## 2015-02-28 NOTE — ED Notes (Signed)
Pt provided with water. States that she feels better and is ready to go. Dr. Roderic Palau notified. MD at bedside now.

## 2015-02-28 NOTE — Discharge Instructions (Signed)
Take benadryl for itching.  Follow up as planned.  Return sooner if needed

## 2015-03-01 NOTE — ED Provider Notes (Signed)
CSN: [id number]     Arrival date & time 02/28/15  1224 History   First MD Initiated Contact with Patient 02/28/15 1239     Chief Complaint  Patient presents with  . Allergic Reaction     (Consider location/radiation/quality/duration/timing/severity/associated sxs/prior Treatment) Patient is a 39 y.o. female presenting with allergic reaction. The history is provided by the patient (the pt states she had an allergic reaction when eposed to perfume and she took two epi shots).  Allergic Reaction Presenting symptoms: difficulty breathing   Presenting symptoms: no rash   Severity:  Moderate Prior episodes: many prior episodes. Context: no animal exposure   Worsened by:  Nothing tried Ineffective treatments:  Epinephrine   Past Medical History  Diagnosis Date  . Migraine   . Kidney stone   . Interstitial cystitis   . Multiple allergies   . Endometriosis    Past Surgical History  Procedure Laterality Date  . Rhinoplasty    . Bladder augmentation    . Tubal ligation    . Knee surgery    . Tonsillectomy    . Cholecystectomy    . Ablation     Family History  Problem Relation Age of Onset  . Heart disease Mother   . Diabetes Father   . Hypertension Father   . Stroke Father   . Heart attack Father   . Breast cancer Maternal Grandmother   . Cancer Maternal Grandfather     melanoma   History  Substance Use Topics  . Smoking status: Current Every Day Smoker -- 0.50 packs/day for 25 years    Types: Cigarettes  . Smokeless tobacco: Not on file  . Alcohol Use: No   OB History    Gravida Para Term Preterm AB TAB SAB Ectopic Multiple Living   5 3 3  2  2   3      Review of Systems  Constitutional: Negative for appetite change and fatigue.  HENT: Negative for congestion, ear discharge and sinus pressure.   Eyes: Negative for discharge.  Respiratory: Positive for shortness of breath. Negative for cough.   Cardiovascular: Negative for chest pain.  Gastrointestinal:  Negative for abdominal pain and diarrhea.  Genitourinary: Negative for frequency and hematuria.  Musculoskeletal: Negative for back pain.  Skin: Negative for rash.  Neurological: Negative for seizures and headaches.  Psychiatric/Behavioral: Negative for hallucinations.      Allergies  Bee venom; Lavender oil; Amitriptyline; Entex; Etodolac; Flexeril; Iodine; Mushroom extract complex; Robaxin; and Shellfish allergy  Home Medications   Prior to Admission medications   Medication Sig Start Date End Date Taking? Authorizing Provider  albuterol (PROVENTIL HFA;VENTOLIN HFA) 108 (90 BASE) MCG/ACT inhaler Inhale 1-2 puffs into the lungs every 6 (six) hours as needed for wheezing or shortness of breath.   Yes Historical Provider, MD  aspirin-acetaminophen-caffeine (EXCEDRIN MIGRAINE) [phone] MG per tablet Take 2 tablets by mouth every 4 (four) hours as needed for migraine.   Yes Historical Provider, MD  diphenhydrAMINE (BENADRYL) 25 MG tablet Take 50 mg by mouth 4 (four) times daily as needed for itching or allergies.    Yes Historical Provider, MD  ciprofloxacin (CIPRO) 500 MG tablet Take 1 tablet (500 mg total) by mouth 2 (two) times daily. Patient not taking: Reported on 12/02/2014 11/04/14   Lattie Haw A Leftwich-Kirby, CNM  clonazePAM (KLONOPIN) 0.5 MG tablet Take 1 tablet (0.5 mg total) by mouth 2 (two) times daily. 02/06/15   Liam Graham, PA-C  EPINEPHrine 0.3 mg/0.3 mL IJ SOAJ  injection Inject 0.3 mLs (0.3 mg total) into the muscle once. 02/28/15   Milton Ferguson, MD  oxyCODONE-acetaminophen (PERCOCET/ROXICET) 5-325 MG per tablet Take 1 tablet by mouth every 4 (four) hours as needed for severe pain. Patient not taking: Reported on 12/15/2014 12/02/14   Nila Nephew, MD  phenazopyridine (PYRIDIUM) 200 MG tablet Take 1 tablet (200 mg total) by mouth 3 (three) times daily as needed for pain (urethral spasm). Patient not taking: Reported on 12/02/2014 11/04/14   Lattie Haw A Leftwich-Kirby, CNM   predniSONE (DELTASONE) 20 MG tablet Take 3 tablets (60 mg total) by mouth daily. Patient not taking: Reported on 12/02/2014 09/26/14   Gregor Hams, MD  promethazine (PHENERGAN) 25 MG tablet Take 0.5-1 tablets (12.5-25 mg total) by mouth every 6 (six) hours as needed. 11/04/14   Kathie Dike Leftwich-Kirby, CNM  rizatriptan (MAXALT) 5 MG tablet Take 1 tablet (5 mg total) by mouth as needed for migraine. May repeat in 2 hours if needed Patient not taking: Reported on 12/02/2014 03/24/14   Annett Gula H Presson, PA   BP 102/62 mmHg  Pulse 85  Temp(Src) 97.9 F (36.6 C) (Oral)  Resp 20  SpO2 94% Physical Exam  Constitutional: She is oriented to person, place, and time. She appears well-developed.  HENT:  Head: Normocephalic.  Eyes: Conjunctivae and EOM are normal. No scleral icterus.  Neck: Neck supple. No thyromegaly present.  Cardiovascular: Normal rate and regular rhythm.  Exam reveals no gallop and no friction rub.   No murmur heard. Pulmonary/Chest: No stridor. She has no wheezes. She has no rales. She exhibits no tenderness.  Abdominal: She exhibits no distension. There is no tenderness. There is no rebound.  Musculoskeletal: Normal range of motion. She exhibits no edema.  Lymphadenopathy:    She has no cervical adenopathy.  Neurological: She is oriented to person, place, and time. She exhibits normal muscle tone. Coordination normal.  Skin: No rash noted. No erythema.  Psychiatric: She has a normal mood and affect. Her behavior is normal.    ED Course  Procedures (including critical care time) Labs Review Labs Reviewed - No data to display  Imaging Review No results found.   EKG Interpretation None      MDM   Final diagnoses:  Allergic reaction, initial encounter    Allergic reaction,  improved    Milton Ferguson, MD 03/01/15 [phone]

## 2015-04-20 ENCOUNTER — Ambulatory Visit (INDEPENDENT_AMBULATORY_CARE_PROVIDER_SITE_OTHER): Payer: 59 | Admitting: Physician Assistant

## 2015-04-20 VITALS — BP 128/72 | HR 91 | Temp 99.0°F | Resp 17 | Ht 63.0 in | Wt 162.0 lb

## 2015-04-20 DIAGNOSIS — R0602 Shortness of breath: Secondary | ICD-10-CM

## 2015-04-20 DIAGNOSIS — R059 Cough, unspecified: Secondary | ICD-10-CM

## 2015-04-20 DIAGNOSIS — R05 Cough: Secondary | ICD-10-CM

## 2015-04-20 MED ORDER — ALBUTEROL SULFATE (2.5 MG/3ML) 0.083% IN NEBU
2.5000 mg | INHALATION_SOLUTION | Freq: Once | RESPIRATORY_TRACT | Status: AC
  Administered 2015-04-20: 2.5 mg via RESPIRATORY_TRACT

## 2015-04-20 MED ORDER — MONTELUKAST SODIUM 10 MG PO TABS: 10.0000 mg | ORAL_TABLET | Freq: Every day | ORAL | Status: DC

## 2015-04-20 MED ORDER — ALBUTEROL SULFATE HFA 108 (90 BASE) MCG/ACT IN AERS: 1.0000 | INHALATION_SPRAY | Freq: Four times a day (QID) | RESPIRATORY_TRACT | Status: DC | PRN

## 2015-04-20 NOTE — Progress Notes (Signed)
Brandi Morris  MRN: [id number] DOB: 02/25/1976  Subjective:  Pt presents to clinic with SOB after she was exposed to perfume at work this am.  She has taken about 5 Benadryl this am to help with her symptoms. She sees an allergist and has an appt on June 16.  She is supposed to have an albuterol inhaler but she gave it to her son and she has not had one available to her today she does feel like she needed it today.  She cannot stop coughing.    She was brought back emergently for evaluation from the lobby.  Patient Active Problem List   Diagnosis Date Noted  . Abnormal Pap smear of cervix 09/19/2014  . Breast lump on right side at 11 o'clock position 08/23/2014  . NEVUS, ATYPICAL 05/26/2009  . TOBACCO USE 05/26/2009  . FLANK PAIN, LEFT 04/07/2008  . NEPHROLITHIASIS, HX OF 10/19/2007  . HYPERLIPIDEMIA 07/30/2007  . ANXIETY 07/30/2007  . DEPRESSION 07/30/2007  . MIGRAINE HEADACHE 07/30/2007  . ALLERGIC RHINITIS 07/30/2007  . ASTHMA 07/30/2007  . INTERSTITIAL CYSTITIS 07/30/2007  . INSOMNIA 07/30/2007  . PERIPHERAL EDEMA 07/30/2007    Past Medical History  Diagnosis Date  . Migraine   . Kidney stone   . Interstitial cystitis   . Multiple allergies   . Endometriosis     Outpatient Prescriptions Prior to Visit  Medication Sig Dispense Refill  . aspirin-acetaminophen-caffeine (EXCEDRIN MIGRAINE) 250-250-65 MG per tablet Take 2 tablets by mouth every 4 (four) hours as needed for migraine.    . diphenhydrAMINE (BENADRYL) 25 MG tablet Take 50 mg by mouth 4 (four) times daily as needed for itching or allergies.     Marland Kitchen EPINEPHrine 0.3 mg/0.3 mL IJ SOAJ injection Inject 0.3 mLs (0.3 mg total) into the muscle once. 2 Device 1  . phenazopyridine (PYRIDIUM) 200 MG tablet Take 1 tablet (200 mg total) by mouth 3 (three) times daily as needed for pain (urethral spasm). 10 tablet 1  . predniSONE (DELTASONE) 20 MG tablet Take 3 tablets (60 mg total) by mouth daily. 15 tablet 0  .  promethazine (PHENERGAN) 25 MG tablet Take 0.5-1 tablets (12.5-25 mg total) by mouth every 6 (six) hours as needed. 30 tablet 0  . rizatriptan (MAXALT) 5 MG tablet Take 1 tablet (5 mg total) by mouth as needed for migraine. May repeat in 2 hours if needed 10 tablet 0  . albuterol (PROVENTIL HFA;VENTOLIN HFA) 108 (90 BASE) MCG/ACT inhaler Inhale 1-2 puffs into the lungs every 6 (six) hours as needed for wheezing or shortness of breath.    . ciprofloxacin (CIPRO) 500 MG tablet Take 1 tablet (500 mg total) by mouth 2 (two) times daily. (Patient not taking: Reported on 12/02/2014) 20 tablet 0  . clonazePAM (KLONOPIN) 0.5 MG tablet Take 1 tablet (0.5 mg total) by mouth 2 (two) times daily. (Patient not taking: Reported on 04/20/2015) 4 tablet 0  . oxyCODONE-acetaminophen (PERCOCET/ROXICET) 5-325 MG per tablet Take 1 tablet by mouth every 4 (four) hours as needed for severe pain. (Patient not taking: Reported on 12/15/2014) 20 tablet 0   No facility-administered medications prior to visit.    Allergies  Allergen Reactions  . Bee Venom Anaphylaxis  . Lavender Oil Anaphylaxis and Other (See Comments)    Lungs and sinuses fill up with fluid  . Amitriptyline Other (See Comments)    "sees weird things"  . Entex Other (See Comments)    dizzy  . Etodolac Other (See Comments)  dizzy  . Flexeril [Cyclobenzaprine] Other (See Comments)    Physically violent    . Iodine Hives  . Mushroom Extract Complex Hives  . Robaxin [Methocarbamol] Other (See Comments)    physically violent  . Shellfish Allergy Hives    Review of Systems  Constitutional: Negative for fever and chills.  Respiratory: Positive for cough, shortness of breath and wheezing.    Objective:  Physical Exam  Constitutional: She is oriented to person, place, and time and well-developed, well-nourished, and in no distress.  HENT:  Head: Normocephalic and atraumatic.  Right Ear: Hearing and external ear normal.  Left Ear: Hearing and  external ear normal.  Eyes: Conjunctivae are normal.  Neck: Normal range of motion.  Cardiovascular: Normal rate, regular rhythm and normal heart sounds.   No murmur heard. Pulmonary/Chest: She is in respiratory distress (pt is not moving air well).  Neurological: She is alert and oriented to person, place, and time. Gait normal.  Skin: Skin is warm and dry.  Psychiatric: Mood, memory, affect and judgment normal.   After albuterol inhaler - pt feels much better - good air movement without wheezing - pt feels much better Assessment and Plan :  Cough - Plan: albuterol (PROVENTIL) (2.5 MG/3ML) 0.083% nebulizer solution 2.5 mg, montelukast (SINGULAIR) 10 MG tablet  SOB (shortness of breath) - Plan: albuterol (PROVENTIL HFA;VENTOLIN HFA) 108 (90 BASE) MCG/ACT inhaler   Pt was brought back emergently due to SOB and her neb was started quickly to help relieve her symptoms.  We will start singulair to help with allergies and bronchospasm.  She will f/u with allergies as scheduled.  Windell Hummingbird PA-C  [facility] 04/20/2015 3:46 PM

## 2015-05-03 ENCOUNTER — Telehealth: Payer: Self-pay

## 2015-05-03 NOTE — Telephone Encounter (Signed)
It would be unusual if she has been taking the singulair and then she just increased the dose for her to have a reaction like a rash but it is possible.  I would have her stop it for a week and then talk to her allergist about restarting it because it is a great medication for allergies and asthma

## 2015-05-03 NOTE — Telephone Encounter (Signed)
Patient is returning a missed call she got on he cell phone. She states she can be reached at her work number until 5. [phone]

## 2015-05-03 NOTE — Telephone Encounter (Signed)
Pt had been taken Singulair QOD but started taking it QD 4 days ago. Now she is broken out in a rash and is very agitated. Advised to stop. She did not take today.   Asked about her allergies. She was feeling somewhat better but had to do her inhaler today. No hives on her face and no difficulties breathing. Please advise. Thanks

## 2015-05-03 NOTE — Telephone Encounter (Signed)
Don't see where we called pt.

## 2015-05-05 NOTE — Telephone Encounter (Signed)
Left message for pt to call back  °

## 2015-05-05 NOTE — Telephone Encounter (Signed)
Spoke with pt, she did get advise from her allergist. She will not be taking singular.

## 2015-05-13 ENCOUNTER — Encounter (HOSPITAL_COMMUNITY): Payer: Self-pay | Admitting: *Deleted

## 2015-05-13 ENCOUNTER — Emergency Department (HOSPITAL_COMMUNITY)
Admission: EM | Admit: 2015-05-13 | Discharge: 2015-05-13 | Disposition: A | Discharge status: Home / Self Care | Payer: Medicaid Other | Attending: Emergency Medicine | Admitting: Emergency Medicine

## 2015-05-13 DIAGNOSIS — Z87442 Personal history of urinary calculi: Secondary | ICD-10-CM | POA: Insufficient documentation

## 2015-05-13 DIAGNOSIS — Z7952 Long term (current) use of systemic steroids: Secondary | ICD-10-CM | POA: Insufficient documentation

## 2015-05-13 DIAGNOSIS — Y9389 Activity, other specified: Secondary | ICD-10-CM | POA: Diagnosis not present

## 2015-05-13 DIAGNOSIS — Y9289 Other specified places as the place of occurrence of the external cause: Secondary | ICD-10-CM | POA: Insufficient documentation

## 2015-05-13 DIAGNOSIS — M791 Myalgia: Secondary | ICD-10-CM | POA: Diagnosis not present

## 2015-05-13 DIAGNOSIS — Y998 Other external cause status: Secondary | ICD-10-CM | POA: Diagnosis not present

## 2015-05-13 DIAGNOSIS — R079 Chest pain, unspecified: Secondary | ICD-10-CM | POA: Diagnosis not present

## 2015-05-13 DIAGNOSIS — T7840XA Allergy, unspecified, initial encounter: Secondary | ICD-10-CM | POA: Insufficient documentation

## 2015-05-13 DIAGNOSIS — R55 Syncope and collapse: Secondary | ICD-10-CM | POA: Diagnosis not present

## 2015-05-13 DIAGNOSIS — Z79899 Other long term (current) drug therapy: Secondary | ICD-10-CM | POA: Diagnosis not present

## 2015-05-13 DIAGNOSIS — Z720 Tobacco use: Secondary | ICD-10-CM | POA: Diagnosis not present

## 2015-05-13 DIAGNOSIS — R0602 Shortness of breath: Secondary | ICD-10-CM | POA: Insufficient documentation

## 2015-05-13 DIAGNOSIS — X58XXXA Exposure to other specified factors, initial encounter: Secondary | ICD-10-CM | POA: Diagnosis not present

## 2015-05-13 DIAGNOSIS — R0989 Other specified symptoms and signs involving the circulatory and respiratory systems: Secondary | ICD-10-CM | POA: Diagnosis not present

## 2015-05-13 DIAGNOSIS — R062 Wheezing: Secondary | ICD-10-CM | POA: Diagnosis not present

## 2015-05-13 MED ORDER — DIPHENHYDRAMINE HCL 25 MG PO TABS: 25.0000 mg | ORAL_TABLET | Freq: Four times a day (QID) | ORAL | Status: DC

## 2015-05-13 MED ORDER — PREDNISONE 20 MG PO TABS: 40.0000 mg | ORAL_TABLET | Freq: Every day | ORAL | Status: DC

## 2015-05-13 MED ORDER — IBUPROFEN 400 MG PO TABS
600.0000 mg | ORAL_TABLET | Freq: Once | ORAL | Status: AC
  Administered 2015-05-13: 600 mg via ORAL
  Filled 2015-05-13 (×2): qty 1

## 2015-05-13 MED ORDER — ACETAMINOPHEN 325 MG PO TABS
650.0000 mg | ORAL_TABLET | Freq: Once | ORAL | Status: AC
  Administered 2015-05-13: 650 mg via ORAL
  Filled 2015-05-13: qty 2

## 2015-05-13 MED ORDER — FAMOTIDINE 20 MG PO TABS: 20.0000 mg | ORAL_TABLET | Freq: Two times a day (BID) | ORAL | Status: DC

## 2015-05-13 MED ORDER — METHYLPREDNISOLONE SODIUM SUCC 125 MG IJ SOLR
125.0000 mg | Freq: Once | INTRAMUSCULAR | Status: AC
  Administered 2015-05-13: 125 mg via INTRAVENOUS
  Filled 2015-05-13: qty 2

## 2015-05-13 NOTE — ED Provider Notes (Signed)
CSN: [id number]     Arrival date & time 05/13/15  1200 History   First MD Initiated Contact with Patient 05/13/15 1203     Chief Complaint  Patient presents with  . Allergic Reaction     (Consider location/radiation/quality/duration/timing/severity/associated sxs/prior Treatment) HPI Comments: Patient presents to the emergency department with complaint of allergic reaction. Patient was at a ball field around silly string which was apparently scented with Lavender. Patient has an extreme allergic reaction to Lavender. Patient has a lifelong history of allergic reactions in which she states she has aspiration due to extreme secretions in her airway. Patient took albuterol which did not help. Husband was on scene and assisted his wife. He describes doing the Heimlich maneuver on her to help clear her airway. Patient lost consciousness. She apparently was not breathing and did not have a pulse per the husband. He administered an EpiPen and approximately 3 rescue breaths and chest compressions and patient immediately woke up. EMS arrived and administered Benadryl and Zantac. Patient currently only complains of chest soreness, headache, and soreness in her right thigh where the EpiPen was injected. She is not having any difficulty breathing or shortness of breath. Patient states that the last time she was in the hospital for allergic reaction she required additional epinephrine for her symptoms. Onset of symptoms acute. Course is resolved. Nothing makes symptoms worse.  Patient is a 39 y.o. female presenting with allergic reaction. The history is provided by the patient and medical records.  Allergic Reaction Presenting symptoms: wheezing   Presenting symptoms: no difficulty swallowing and no rash     Past Medical History  Diagnosis Date  . Migraine   . Kidney stone   . Interstitial cystitis   . Multiple allergies   . Endometriosis    Past Surgical History  Procedure Laterality Date  .  Rhinoplasty    . Bladder augmentation    . Tubal ligation    . Knee surgery    . Tonsillectomy    . Cholecystectomy    . Ablation     Family History  Problem Relation Age of Onset  . Heart disease Mother   . Diabetes Father   . Hypertension Father   . Stroke Father   . Heart attack Father   . Breast cancer Maternal Grandmother   . Cancer Maternal Grandfather     melanoma   History  Substance Use Topics  . Smoking status: Current Every Day Smoker -- 0.50 packs/day for 25 years    Types: Cigarettes  . Smokeless tobacco: Not on file  . Alcohol Use: No   OB History    Gravida Para Term Preterm AB TAB SAB Ectopic Multiple Living   5 3 3  2  2   3      Review of Systems  Constitutional: Negative for fever.  HENT: Negative for facial swelling and trouble swallowing.   Eyes: Negative for redness.  Respiratory: Positive for cough, choking, shortness of breath and wheezing. Negative for stridor.   Cardiovascular: Positive for chest pain.  Gastrointestinal: Negative for nausea and vomiting.  Musculoskeletal: Positive for myalgias.  Skin: Negative for rash.  Neurological: Positive for syncope and headaches. Negative for light-headedness.  Psychiatric/Behavioral: Negative for confusion.      Allergies  Bee venom; Lavender oil; Amitriptyline; Entex; Etodolac; Flexeril; Iodine; Mushroom extract complex; Robaxin; Shellfish allergy; and Singulair  Home Medications   Prior to Admission medications   Medication Sig Start Date End Date Taking? Authorizing Provider  albuterol (  PROVENTIL HFA;VENTOLIN HFA) 108 (90 BASE) MCG/ACT inhaler Inhale 1-2 puffs into the lungs every 6 (six) hours as needed for wheezing or shortness of breath. 04/20/15   Mancel Bale, PA-C  aspirin-acetaminophen-caffeine (EXCEDRIN MIGRAINE) [phone] MG per tablet Take 2 tablets by mouth every 4 (four) hours as needed for migraine.    Historical Provider, MD  ciprofloxacin (CIPRO) 500 MG tablet Take 1 tablet (500  mg total) by mouth 2 (two) times daily. Patient not taking: Reported on 12/02/2014 11/04/14   Lattie Haw A Leftwich-Kirby, CNM  clonazePAM (KLONOPIN) 0.5 MG tablet Take 1 tablet (0.5 mg total) by mouth 2 (two) times daily. Patient not taking: Reported on 04/20/2015 02/06/15   Liam Graham, PA-C  diphenhydrAMINE (BENADRYL) 25 MG tablet Take 50 mg by mouth 4 (four) times daily as needed for itching or allergies.     Historical Provider, MD  EPINEPHrine 0.3 mg/0.3 mL IJ SOAJ injection Inject 0.3 mLs (0.3 mg total) into the muscle once. 02/28/15   Milton Ferguson, MD  montelukast (SINGULAIR) 10 MG tablet Take 1 tablet (10 mg total) by mouth at bedtime. 04/20/15   Mancel Bale, PA-C  oxyCODONE-acetaminophen (PERCOCET/ROXICET) 5-325 MG per tablet Take 1 tablet by mouth every 4 (four) hours as needed for severe pain. Patient not taking: Reported on 12/15/2014 12/02/14   Nila Nephew, MD  phenazopyridine (PYRIDIUM) 200 MG tablet Take 1 tablet (200 mg total) by mouth 3 (three) times daily as needed for pain (urethral spasm). 11/04/14   Lisa A Leftwich-Kirby, CNM  predniSONE (DELTASONE) 20 MG tablet Take 3 tablets (60 mg total) by mouth daily. 09/26/14   Gregor Hams, MD  promethazine (PHENERGAN) 25 MG tablet Take 0.5-1 tablets (12.5-25 mg total) by mouth every 6 (six) hours as needed. 11/04/14   Kathie Dike Leftwich-Kirby, CNM  rizatriptan (MAXALT) 5 MG tablet Take 1 tablet (5 mg total) by mouth as needed for migraine. May repeat in 2 hours if needed 03/24/14   Audelia Hives Presson, PA   BP 109/69 mmHg  Pulse 93  Temp(Src) 98.6 F (37 C) (Oral)  Resp 23  Ht 5\' 2"  (1.575 m)  Wt 164 lb (74.39 kg)  BMI 29.99 kg/m2  SpO2 97%   Physical Exam  Constitutional: She appears well-developed and well-nourished.  HENT:  Head: Normocephalic and atraumatic.  No angioedema.  Eyes: Conjunctivae are normal. Right eye exhibits no discharge. Left eye exhibits no discharge.  Neck: Normal range of motion. Neck supple.   Cardiovascular: Normal rate, regular rhythm and normal heart sounds.   Pulmonary/Chest: Effort normal and breath sounds normal. No respiratory distress. She has no wheezes. She has no rales.  Abdominal: Soft. There is no tenderness. There is no rebound and no guarding.  Musculoskeletal: She exhibits tenderness. She exhibits no edema.  Tenderness at site of IM epinephrine injection right inner thigh.  Neurological: She is alert.  Skin: Skin is warm and dry.  Psychiatric: She has a normal mood and affect.  Nursing note and vitals reviewed.   ED Course  Procedures (including critical care time) Labs Review Labs Reviewed - No data to display  Imaging Review No results found.   EKG Interpretation   Date/Time:  Saturday May 13 2015 12:00:57 EDT Ventricular Rate:  89 PR Interval:  130 QRS Duration: 103 QT Interval:  347 QTC Calculation: 422 R Axis:   63 Text Interpretation:  Sinus rhythm Low voltage, precordial leads RSR' in  V1 or V2, right VCD or RVH No significant  change since last tracing  Confirmed by [facility]  MD, Loree Fee ([id number]) on 05/13/2015 12:22:52 PM       12:21 PM Patient seen and examined. Work-up initiated. Medications ordered. Discussed with Dr. Maryan Rued. EKG reviewed.   Vital signs reviewed and are as follows: BP 109/69 mmHg  Pulse 93  Temp(Src) 98.6 F (37 C) (Oral)  Resp 23  Ht 5\' 2"  (1.575 m)  Wt 164 lb (74.39 kg)  BMI 29.99 kg/m2  SpO2 97%  3:13 PM Patient has remained stable with no signs of rebound during ED stay. Patient states that she feels well. Will discharge to home with prednisone, Benadryl, Pepcid. Patient encouraged to return with recurrent symptoms.  MDM   Final diagnoses:  Allergic reaction, initial encounter   Patients with allergic reaction, history of previous reactions. Questionable apneic episode. Patient has returned to baseline upon arrival to the emergency department and has remained stable during emergency department stay.  Patient was monitored until 4 hours status post incident. No evidence or signs of rebound. She is stable for discharge to home at this time with usual treatment. No dangerous or life-threatening conditions suspected or identified by history, physical exam, and by work-up. No indications for hospitalization identified.      Carlisle Cater, PA-C 05/13/15 [place], MD 05/14/15 1012

## 2015-05-13 NOTE — ED Notes (Signed)
PA at bedside.

## 2015-05-13 NOTE — Discharge Instructions (Signed)
Please read and follow all provided instructions.  Your diagnoses today include:  1. Allergic reaction, initial encounter    Tests performed today include:  Vital signs. See below for your results today.   Medications prescribed:   Prednisone - steroid medicine   It is best to take this medication in the morning to prevent sleeping problems. If you are diabetic, monitor your blood sugar closely and stop taking Prednisone if blood sugar is over 300. Take with food to prevent stomach upset.    Benadryl (diphenhydramine) - antihistamine  You can find this medication over-the-counter.   DO NOT exceed:   50mg  Benadryl every 6 hours    Benadryl will make you drowsy. DO NOT drive or perform any activities that require you to be awake and alert if taking this.   Pepcid (famotidine) - antihistamine  You can find this medication over-the-counter.   DO NOT exceed:   20mg  Pepcid every 12 hours  Take any prescribed medications only as directed.  Home care instructions:   Follow any educational materials contained in this packet  Follow-up instructions: Please follow-up with your primary care provider in the next 3 days for further evaluation of your symptoms.   Return instructions:   Please return to the Emergency Department if you experience worsening symptoms.   Call 9-1-1 immediately if you have an allergic reaction that involves your lips, mouth, throat or if you have any difficulty breathing. This is a life-threatening emergency.   Please return if you have any other emergent concerns.  Additional Information:  Your vital signs today were: BP 101/58 mmHg   Pulse 76   Temp(Src) 98.9 F (37.2 C) (Oral)   Resp 16   Ht 5\' 2"  (1.575 m)   Wt 164 lb (74.39 kg)   BMI 29.99 kg/m2   SpO2 97% If your blood pressure (BP) was elevated above 135/85 this visit, please have this repeated by your doctor within one month. --------------

## 2015-05-13 NOTE — ED Notes (Signed)
Pt presents via [facility] for an allergic reaction.  Pt was at ball field and silly string was being sprayed when pt became SOB, coughing.  Per report pt went unresponsive, not breathing, 3 rescue breaths and 3 chest compressions were given and pt became a x 4.  Pt took 4 puffs of albuterol inhaler, Epi Jr pen was administered, benedryl and 50 Zantac by EMS.  Pt a x 4 on arrival, no SOB or distress.  Pt only c/o headache.  Pt reports this happens q 3- 4 weeks.

## 2015-06-15 ENCOUNTER — Ambulatory Visit (INDEPENDENT_AMBULATORY_CARE_PROVIDER_SITE_OTHER): Payer: 59 | Admitting: Family

## 2015-06-15 ENCOUNTER — Encounter: Payer: Self-pay | Admitting: Family

## 2015-06-15 VITALS — BP 120/88 | HR 85 | Temp 97.9°F | Resp 18 | Ht 63.0 in | Wt 173.0 lb

## 2015-06-15 DIAGNOSIS — R0602 Shortness of breath: Secondary | ICD-10-CM | POA: Diagnosis not present

## 2015-06-15 DIAGNOSIS — G43809 Other migraine, not intractable, without status migrainosus: Secondary | ICD-10-CM

## 2015-06-15 DIAGNOSIS — E785 Hyperlipidemia, unspecified: Secondary | ICD-10-CM

## 2015-06-15 DIAGNOSIS — Z720 Tobacco use: Secondary | ICD-10-CM | POA: Diagnosis not present

## 2015-06-15 DIAGNOSIS — J4530 Mild persistent asthma, uncomplicated: Secondary | ICD-10-CM | POA: Diagnosis not present

## 2015-06-15 DIAGNOSIS — F17200 Nicotine dependence, unspecified, uncomplicated: Secondary | ICD-10-CM

## 2015-06-15 DIAGNOSIS — F411 Generalized anxiety disorder: Secondary | ICD-10-CM

## 2015-06-15 MED ORDER — SERTRALINE HCL 50 MG PO TABS: 50.0000 mg | ORAL_TABLET | Freq: Every day | ORAL | Status: DC

## 2015-06-15 MED ORDER — ALBUTEROL SULFATE HFA 108 (90 BASE) MCG/ACT IN AERS: 1.0000 | INHALATION_SPRAY | Freq: Four times a day (QID) | RESPIRATORY_TRACT | Status: DC | PRN

## 2015-06-15 MED ORDER — PROMETHAZINE HCL 25 MG PO TABS: 25.0000 mg | ORAL_TABLET | Freq: Three times a day (TID) | ORAL | Status: DC | PRN

## 2015-06-15 MED ORDER — RIZATRIPTAN BENZOATE 10 MG PO TABS: 5.0000 mg | ORAL_TABLET | ORAL | Status: DC | PRN

## 2015-06-15 MED ORDER — BUSPIRONE HCL 10 MG PO TABS: 10.0000 mg | ORAL_TABLET | Freq: Three times a day (TID) | ORAL | Status: DC

## 2015-06-15 MED ORDER — FLUTICASONE FUROATE-VILANTEROL 100-25 MCG/INH IN AEPB: 1.0000 | INHALATION_SPRAY | Freq: Every day | RESPIRATORY_TRACT | Status: DC

## 2015-06-15 NOTE — Assessment & Plan Note (Signed)
Previous cholesterol readings are borderline for medication. Given family history of cardiovascular disease I recommend more aggressive treatments to reduce cardiovascular disease risk factors. Plan is to work on diet and exercise for 1 month and follow up to determine progress. Pending results will continue with lifestyle management or start low-dose statin therapy.

## 2015-06-15 NOTE — Assessment & Plan Note (Signed)
Continues to experience anxiety which is most likely related to her inability to discontinue tobacco use. Start Buspar and Zoloft. Instructed that these medications may take several weeks before effects are noted. Follow up in 1 month to determine effectiveness.

## 2015-06-15 NOTE — Patient Instructions (Signed)
Thank you for choosing [facility].  Summary/Instructions:  Your prescription(s) have been submitted to your pharmacy or been printed and provided for you. Please take as directed and contact our office if you believe you are having problem(s) with the medication(s) or have any questions.  Please stop by the lab on the basement level of the building for your blood work. Your results will be released to MyChart (or called to you) after review, usually within 72 hours after test completion. If any changes need to be made, you will be notified at that same time.  If your symptoms worsen or fail to improve, please contact our office for further instruction, or in case of emergency go directly to the emergency room at the closest medical facility.     

## 2015-06-15 NOTE — Assessment & Plan Note (Signed)
Migraine headaches remain uncontrolled with Imitrex. Discontinue Imitrex. Start Maxalt. Follow-up if symptoms worsen or headaches or not controlled with Maxalt.

## 2015-06-15 NOTE — Progress Notes (Signed)
Pre visit review using our clinic review tool, if applicable. No additional management support is needed unless otherwise documented below in the visit note. 

## 2015-06-15 NOTE — Assessment & Plan Note (Signed)
Increase in shortness of breath most likely related to long term smoking and previous history of asthma. Continue previous dosage of albuterol as needed. Start Breo sample. Refer pulmonology for lung function testing. Goal is to quit smoking which should help to improve her current lung function.

## 2015-06-15 NOTE — Assessment & Plan Note (Signed)
Wishing to quit smoking and has tried multiple mechanisms including medications, gums and patches. Discussed controlling her anxiety through medication which should be able to supplement her ability to quit smoking.

## 2015-06-15 NOTE — Progress Notes (Signed)
Subjective:    Patient ID: Brandi Morris, female    DOB: 02-07-1976, 39 y.o.   MRN: [id number]  Chief Complaint  Patient presents with  . Establish Care    has high cholesterol and wants to quit smoking, has trouble walking up steps at work due to SOB    HPI:  Brandi Morris is a 39 y.o. female with a PMH of tobacco use, anxiety, asthma, depression, hyperlipidemia, insomnia, and migraines who presents today for an office visit to establish care.   1.) Elevated cholesterol - Recently had blood work completed through her employer. Her HDL was 48, LDL 138 and TC of 238. Her triglyceride 220. Not currently taking any medication and denies any symptoms of chest pain/discomfort or palpitations.   2.) Smoking - Has previously tried quitting smoking with all methods that are available including Zyban, Chantix, and gums and patches. Reports that her agitation is what stops her from stopping smoking. She becomes very agitated when she drops below 0.5 packs per day.   3.) Asthma / Trouble breathing - previously diagnosed with exercise-induced asthma. Notes that her symptoms becoming short of breath have increased over the last several months. Modifying factors include an older albuterol inhaler that was previously prescribed which seems to help some.   4.) Migraines - previously diagnosed with migraine headaches. Reports previously prescribed Imitrex does not help with her headaches. Requesting Maxalt or Zomig if possible.  Allergies  Allergen Reactions  . Bee Venom Anaphylaxis  . Lavender Oil Anaphylaxis and Other (See Comments)    Lungs and sinuses fill up with fluid  . Singulair [Montelukast Sodium] Other (See Comments)    Agitated. "My sinus cavities fill with fluid and I start choking on foam."  . Amitriptyline Other (See Comments)    "sees weird things"  . Entex Other (See Comments)    dizzy  . Etodolac Other (See Comments)    dizzy  . Flexeril [Cyclobenzaprine] Other (See  Comments)    Physically violent    . Iodine Hives  . Mushroom Extract Complex Hives  . Robaxin [Methocarbamol] Other (See Comments)    physically violent  . Shellfish Allergy Hives     Outpatient Prescriptions Prior to Visit  Medication Sig Dispense Refill  . diphenhydrAMINE (BENADRYL) 25 MG tablet Take 1 tablet (25 mg total) by mouth every 6 (six) hours. 12 tablet 0  . albuterol (PROVENTIL HFA;VENTOLIN HFA) 108 (90 BASE) MCG/ACT inhaler Inhale 1-2 puffs into the lungs every 6 (six) hours as needed for wheezing or shortness of breath. 18 g 2  . aspirin-acetaminophen-caffeine (EXCEDRIN MIGRAINE) [phone] MG per tablet Take 2 tablets by mouth every 4 (four) hours as needed for migraine.    Marland Kitchen EPINEPHrine 0.3 mg/0.3 mL IJ SOAJ injection Inject 0.3 mLs (0.3 mg total) into the muscle once. 2 Device 1  . famotidine (PEPCID) 20 MG tablet Take 1 tablet (20 mg total) by mouth 2 (two) times daily. 6 tablet 0  . predniSONE (DELTASONE) 20 MG tablet Take 2 tablets (40 mg total) by mouth daily. 8 tablet 0   No facility-administered medications prior to visit.     Past Medical History  Diagnosis Date  . Migraine   . Kidney stone   . Interstitial cystitis   . Multiple allergies   . Endometriosis   . Hyperlipidemia   . Asthma     Exercise induced  . Cancer     Cervical  . 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 Rhinoplasty    . Bladder augmentation    . Tubal ligation    . Knee surgery    . Tonsillectomy    . Cholecystectomy    . Ablation    . Leep       Family History  Problem Relation Age of Onset  . Heart disease Mother   . COPD Mother   . Diabetes Father   . Hypertension Father   . Stroke Father   . Heart attack Father   . Breast cancer Maternal Grandmother   . Cancer Maternal Grandfather     melanoma  . Heart disease Paternal Grandmother   . Stroke Paternal Grandmother   . Heart attack Paternal  Grandmother   . Heart disease Paternal Grandfather   . Stroke Paternal Grandfather   . Heart attack Paternal Grandfather      History   Social History  . Marital Status: Single    Spouse Name: N/A  . Number of Children: 3  . Years of Education: 15   Occupational History  . Billing Specialist    Social History Main Topics  . Smoking status: Current Every Day Smoker -- 0.50 packs/day for 25 years    Types: Cigarettes  . Smokeless tobacco: Never Used  . Alcohol Use: Yes     Comment: rarely  . Drug Use: No  . Sexual Activity: Yes    Birth Control/ Protection: Surgical   Other Topics Concern  . Not on file   Social History Narrative   Fun: Fish and hunt   Denies abuse and feels safe at home.      Review of Systems  Constitutional: Negative for fever and chills.  Respiratory: Positive for shortness of breath. Negative for chest tightness and wheezing.   Cardiovascular: Negative for chest pain, palpitations and leg swelling.  Neurological: Negative for headaches.      Objective:    BP 120/88 mmHg  Pulse 85  Temp(Src) 97.9 F (36.6 C) (Oral)  Resp 18  Ht 5\' 3"  (1.6 m)  Wt 173 lb (78.472 kg)  BMI 30.65 kg/m2  SpO2 96% Nursing note and vital signs reviewed.  Physical Exam  Constitutional: She is oriented to person, place, and time. She appears well-developed and well-nourished. No distress.  Cardiovascular: Normal rate, regular rhythm, normal heart sounds and intact distal pulses.   Pulmonary/Chest: Effort normal and breath sounds normal.  Neurological: She is alert and oriented to person, place, and time.  Skin: Skin is warm and dry.  Psychiatric: She has a normal mood and affect. Her behavior is normal. Judgment and thought content normal.       Assessment & Plan:   Problem List Items Addressed This Visit      Cardiovascular and Mediastinum   Migraine    Migraine headaches remain uncontrolled with Imitrex. Discontinue Imitrex. Start Maxalt. Follow-up  if symptoms worsen or headaches or not controlled with Maxalt.      Relevant Medications   sertraline (ZOLOFT) 50 MG tablet   rizatriptan (MAXALT) 10 MG tablet     Respiratory   Asthma    Increase in shortness of breath most likely related to long term smoking and previous history of asthma. Continue previous dosage of albuterol as needed. Start Breo sample. Refer pulmonology for lung function testing. Goal is to quit smoking which should help to improve her current lung function.  Relevant Medications   albuterol (PROVENTIL HFA;VENTOLIN HFA) 108 (90 BASE) MCG/ACT inhaler   Fluticasone Furoate-Vilanterol (BREO ELLIPTA) 100-25 MCG/INH AEPB   Other Relevant Orders   Ambulatory referral to Pulmonology     Other   Hyperlipidemia    Previous cholesterol readings are borderline for medication. Given family history of cardiovascular disease I recommend more aggressive treatments to reduce cardiovascular disease risk factors. Plan is to work on diet and exercise for 1 month and follow up to determine progress. Pending results will continue with lifestyle management or start low-dose statin therapy.       Anxiety state    Continues to experience anxiety which is most likely related to her inability to discontinue tobacco use. Start Buspar and Zoloft. Instructed that these medications may take several weeks before effects are noted. Follow up in 1 month to determine effectiveness.       Relevant Medications   sertraline (ZOLOFT) 50 MG tablet   busPIRone (BUSPAR) 10 MG tablet   TOBACCO USE    Wishing to quit smoking and has tried multiple mechanisms including medications, gums and patches. Discussed controlling her anxiety through medication which should be able to supplement her ability to quit smoking.        Other Visit Diagnoses    SOB (shortness of breath)    -  Primary    Relevant Medications    albuterol (PROVENTIL HFA;VENTOLIN HFA) 108 (90 BASE) MCG/ACT inhaler    Other  Relevant Orders    Ambulatory referral to Pulmonology

## 2015-06-20 ENCOUNTER — Other Ambulatory Visit: Payer: Self-pay | Admitting: Family

## 2015-06-20 DIAGNOSIS — J4530 Mild persistent asthma, uncomplicated: Secondary | ICD-10-CM

## 2015-06-26 ENCOUNTER — Encounter: Payer: Self-pay | Admitting: Family

## 2015-06-28 ENCOUNTER — Emergency Department (HOSPITAL_COMMUNITY)
Admission: EM | Admit: 2015-06-28 | Discharge: 2015-06-28 | Disposition: A | Discharge status: Home / Self Care | Payer: 59 | Attending: Physician Assistant | Admitting: Physician Assistant

## 2015-06-28 ENCOUNTER — Encounter (HOSPITAL_COMMUNITY): Payer: Self-pay | Admitting: Emergency Medicine

## 2015-06-28 DIAGNOSIS — Z79899 Other long term (current) drug therapy: Secondary | ICD-10-CM | POA: Insufficient documentation

## 2015-06-28 DIAGNOSIS — Z720 Tobacco use: Secondary | ICD-10-CM | POA: Diagnosis not present

## 2015-06-28 DIAGNOSIS — G43909 Migraine, unspecified, not intractable, without status migrainosus: Secondary | ICD-10-CM | POA: Insufficient documentation

## 2015-06-28 DIAGNOSIS — T7840XA Allergy, unspecified, initial encounter: Secondary | ICD-10-CM | POA: Insufficient documentation

## 2015-06-28 DIAGNOSIS — X58XXXA Exposure to other specified factors, initial encounter: Secondary | ICD-10-CM | POA: Insufficient documentation

## 2015-06-28 DIAGNOSIS — J45901 Unspecified asthma with (acute) exacerbation: Secondary | ICD-10-CM | POA: Insufficient documentation

## 2015-06-28 DIAGNOSIS — Z8541 Personal history of malignant neoplasm of cervix uteri: Secondary | ICD-10-CM | POA: Diagnosis not present

## 2015-06-28 DIAGNOSIS — Z8673 Personal history of transient ischemic attack (TIA), and cerebral infarction without residual deficits: Secondary | ICD-10-CM | POA: Diagnosis not present

## 2015-06-28 DIAGNOSIS — Y998 Other external cause status: Secondary | ICD-10-CM | POA: Insufficient documentation

## 2015-06-28 DIAGNOSIS — Y9289 Other specified places as the place of occurrence of the external cause: Secondary | ICD-10-CM | POA: Insufficient documentation

## 2015-06-28 DIAGNOSIS — Z8742 Personal history of other diseases of the female genital tract: Secondary | ICD-10-CM | POA: Insufficient documentation

## 2015-06-28 DIAGNOSIS — Y9389 Activity, other specified: Secondary | ICD-10-CM | POA: Insufficient documentation

## 2015-06-28 DIAGNOSIS — Z7951 Long term (current) use of inhaled steroids: Secondary | ICD-10-CM | POA: Insufficient documentation

## 2015-06-28 DIAGNOSIS — Z8639 Personal history of other endocrine, nutritional and metabolic disease: Secondary | ICD-10-CM | POA: Diagnosis not present

## 2015-06-28 DIAGNOSIS — R05 Cough: Secondary | ICD-10-CM | POA: Diagnosis present

## 2015-06-28 MED ORDER — METHYLPREDNISOLONE SODIUM SUCC 125 MG IJ SOLR
125.0000 mg | Freq: Once | INTRAMUSCULAR | Status: AC
  Administered 2015-06-28: 125 mg via INTRAVENOUS
  Filled 2015-06-28: qty 2

## 2015-06-28 NOTE — ED Notes (Signed)
Pt aware plan to observe for worsening of symptoms.  Call light in reach.  NAD.  Will CTM.

## 2015-06-28 NOTE — ED Notes (Signed)
Bed: WTR6 Expected date:  Expected time:  Means of arrival:  Comments: EMS- 39yo F, allergic reaction

## 2015-06-28 NOTE — Discharge Instructions (Signed)
Allergies °Allergies may happen from anything your body is sensitive to. This may be food, medicines, pollens, chemicals, and nearly anything around you in everyday life that produces allergens. An allergen is anything that causes an allergy producing substance. Heredity is often a factor in causing these problems. This means you may have some of the same allergies as your parents. °Food allergies happen in all age groups. Food allergies are some of the most severe and life threatening. Some common food allergies are cow's milk, seafood, eggs, nuts, wheat, and soybeans. °SYMPTOMS  °· Swelling around the mouth. °· An itchy red rash or hives. °· Vomiting or diarrhea. °· Difficulty breathing. °SEVERE ALLERGIC REACTIONS ARE LIFE-THREATENING. °This reaction is called anaphylaxis. It can cause the mouth and throat to swell and cause difficulty with breathing and swallowing. In severe reactions only a trace amount of food (for example, peanut oil in a salad) may cause death within seconds. °Seasonal allergies occur in all age groups. These are seasonal because they usually occur during the same season every year. They may be a reaction to molds, grass pollens, or tree pollens. Other causes of problems are house dust mite allergens, pet dander, and mold spores. The symptoms often consist of nasal congestion, a runny itchy nose associated with sneezing, and tearing itchy eyes. There is often an associated itching of the mouth and ears. The problems happen when you come in contact with pollens and other allergens. Allergens are the particles in the air that the body reacts to with an allergic reaction. This causes you to release allergic antibodies. Through a chain of events, these eventually cause you to release histamine into the blood stream. Although it is meant to be protective to the body, it is this release that causes your discomfort. This is why you were given anti-histamines to feel better.  If you are unable to  pinpoint the offending allergen, it may be determined by skin or blood testing. Allergies cannot be cured but can be controlled with medicine. °Hay fever is a collection of all or some of the seasonal allergy problems. It may often be treated with simple over-the-counter medicine such as diphenhydramine. Take medicine as directed. Do not drink alcohol or drive while taking this medicine. Check with your caregiver or package insert for child dosages. °If these medicines are not effective, there are many new medicines your caregiver can prescribe. Stronger medicine such as nasal spray, eye drops, and corticosteroids may be used if the first things you try do not work well. Other treatments such as immunotherapy or desensitizing injections can be used if all else fails. Follow up with your caregiver if problems continue. These seasonal allergies are usually not life threatening. They are generally more of a nuisance that can often be handled using medicine. °HOME CARE INSTRUCTIONS  °· If unsure what causes a reaction, keep a diary of foods eaten and symptoms that follow. Avoid foods that cause reactions. °· If hives or rash are present: °· Take medicine as directed. °· You may use an over-the-counter antihistamine (diphenhydramine) for hives and itching as needed. °· Apply cold compresses (cloths) to the skin or take baths in cool water. Avoid hot baths or showers. Heat will make a rash and itching worse. °· If you are severely allergic: °· Following a treatment for a severe reaction, hospitalization is often required for closer follow-up. °· Wear a medic-alert bracelet or necklace stating the allergy. °· You and your family must learn how to give adrenaline or use   an anaphylaxis kit.  If you have had a severe reaction, always carry your anaphylaxis kit or EpiPen with you. Use this medicine as directed by your caregiver if a severe reaction is occurring. Failure to do so could have a fatal outcome. SEEK MEDICAL  CARE IF:  You suspect a food allergy. Symptoms generally happen within 30 minutes of eating a food.  Your symptoms have not gone away within 2 days or are getting worse.  You develop new symptoms.  You want to retest yourself or your child with a food or drink you think causes an allergic reaction. Never do this if an anaphylactic reaction to that food or drink has happened before. Only do this under the care of a caregiver. SEEK IMMEDIATE MEDICAL CARE IF:   You have difficulty breathing, are wheezing, or have a tight feeling in your chest or throat.  You have a swollen mouth, or you have hives, swelling, or itching all over your body.  You have had a severe reaction that has responded to your anaphylaxis kit or an EpiPen. These reactions may return when the medicine has worn off. These reactions should be considered life threatening. MAKE SURE YOU:   Understand these instructions.  Will watch your condition.  Will get help right away if you are not doing well or get worse. Document Released: 01/28/2003 Document Revised: 03/01/2013 Document Reviewed: 07/04/2008 [facility] Patient Information 2015 [place], [place]. This information is not intended to replace advice given to you by your health care provider. Make sure you discuss any questions you have with your health care provider. Anaphylactic Reaction An anaphylactic reaction is a sudden, severe allergic reaction that involves the whole body. It can be life threatening. A hospital stay is often required. People with asthma, eczema, or hay fever are slightly more likely to have an anaphylactic reaction. CAUSES  An anaphylactic reaction may be caused by anything to which you are allergic. After being exposed to the allergic substance, your immune system becomes sensitized to it. When you are exposed to that allergic substance again, an allergic reaction can occur. Common causes of an anaphylactic reaction include:  Medicines.  Foods,  especially peanuts, wheat, shellfish, milk, and eggs.  Insect bites or stings.  Blood products.  Chemicals, such as dyes, latex, and contrast material used for imaging tests. SYMPTOMS  When an allergic reaction occurs, the body releases histamine and other substances. These substances cause symptoms such as tightening of the airway. Symptoms often develop within seconds or minutes of exposure. Symptoms may include:  Skin rash or hives.  Itching.  Chest tightness.  Swelling of the eyes, tongue, or lips.  Trouble breathing or swallowing.  Lightheadedness or fainting.  Anxiety or confusion.  Stomach pains, vomiting, or diarrhea.  Nasal congestion.  A fast or irregular heartbeat (palpitations). DIAGNOSIS  Diagnosis is based on your history of recent exposure to allergic substances, your symptoms, and a physical exam. Your caregiver may also perform blood or urine tests to confirm the diagnosis. TREATMENT  Epinephrine medicine is the main treatment for an anaphylactic reaction. Other medicines that may be used for treatment include antihistamines, steroids, and albuterol. In severe cases, fluids and medicine to support blood pressure may be given through an intravenous line (IV). Even if you improve after treatment, you need to be observed to make sure your condition does not get worse. This may require a stay in the hospital. [place] a medical alert bracelet or necklace stating your  allergy.  You and your family must learn how to use an anaphylaxis kit or give an epinephrine injection to temporarily treat an emergency allergic reaction. Always carry your epinephrine injection or anaphylaxis kit with you. This can be lifesaving if you have a severe reaction.  Do not drive or perform tasks after treatment until the medicines used to treat your reaction have worn off, or until your caregiver says it is okay.  If you have hives or a rash:  Take medicines as  directed by your caregiver.  You may use an over-the-counter antihistamine (diphenhydramine) as needed.  Apply cold compresses to the skin or take baths in cool water. Avoid hot baths or showers. SEEK MEDICAL CARE IF:   You develop symptoms of an allergic reaction to a new substance. Symptoms may start right away or minutes later.  You develop a rash, hives, or itching.  You develop new symptoms. SEEK IMMEDIATE MEDICAL CARE IF:   You have swelling of the mouth, difficulty breathing, or wheezing.  You have a tight feeling in your chest or throat.  You develop hives, swelling, or itching all over your body.  You develop severe vomiting or diarrhea.  You feel faint or pass out. This is an emergency. Use your epinephrine injection or anaphylaxis kit as you have been instructed. Call your local emergency services (911 in [place] Even if you improve after the injection, you need to be examined at a hospital emergency department. MAKE SURE YOU:   Understand these instructions.  Will watch your condition.  Will get help right away if you are not doing well or get worse. Document Released: 11/04/2005 Document Revised: 11/09/2013 Document Reviewed: 02/05/2012 [facility] Patient Information 2015 [place], [place]. This information is not intended to replace advice given to you by your health care provider. Make sure you discuss any questions you have with your health care provider. Epinephrine Injection Epinephrine is a medicine given by injection to temporarily treat an emergency allergic reaction. It is also used to treat severe asthmatic attacks and other lung problems. The medicine helps to enlarge (dilate) the small breathing tubes of the lungs. A life-threatening, sudden allergic reaction that involves the whole body is called anaphylaxis. Because of potential side effects, epinephrine should only be used as directed by your caregiver. RISKS AND COMPLICATIONS Possible side effects of  epinephrine injections include:  Chest pain.  Irregular or rapid heartbeat.  Shortness of breath.  Nausea.  Vomiting.  Abdominal pain or cramping.  Sweating.  Dizziness.  Weakness.  Headache.  Nervousness. Report all side effects to your caregiver. HOW TO GIVE AN EPINEPHRINE INJECTION Give the epinephrine injection immediately when symptoms of a severe reaction begin. Inject the medicine into the outer thigh or any available, large muscle. Your caregiver can teach you how to do this. You do not need to remove any clothing. After the injection, call your local emergency services (911 in [place] Even if you improve after the injection, you need to be examined at a hospital emergency department. Epinephrine works quickly, but it also wears off quickly. Delayed reactions can occur. A delayed reaction may be as serious and dangerous as the initial reaction. HOME CARE INSTRUCTIONS  Make sure you and your family know how to give an epinephrine injection.  Use epinephrine injections as directed by your caregiver. Do not use this medicine more often or in larger doses than prescribed.  Always carry your epinephrine injection or anaphylaxis kit with you. This can be lifesaving if you have  a severe reaction.  Store the medicine in a cool, dry place. If the medicine becomes discolored or cloudy, dispose of it properly and replace it with new medicine.  Check the expiration date on your medicine. It may be unsafe to use medicines past their expiration date.  Tell your caregiver about any other medicines you are taking. Some medicines can react badly with epinephrine.  Tell your caregiver about any medical conditions you have, such as diabetes, high blood pressure (hypertension), heart disease, irregular heartbeats, or if you are pregnant. SEEK IMMEDIATE MEDICAL CARE IF:  You have used an epinephrine injection. Call your local emergency services (911 in [place] Even if you improve after  the injection, you need to be examined at a hospital emergency department to make sure your allergic reaction is under control. You will also be monitored for adverse effects from the medicine.  You have chest pain.  You have irregular or fast heartbeats.  You have shortness of breath.  You have severe headaches.  You have severe nausea, vomiting, or abdominal cramps.  You have severe pain, swelling, or redness in the area where you gave the injection. Document Released: 11/01/2000 Document Revised: 01/27/2012 Document Reviewed: 07/24/2011 [facility] Patient Information 2015 [place], [place]. This information is not intended to replace advice given to you by your health care provider. Make sure you discuss any questions you have with your health care provider.

## 2015-06-28 NOTE — ED Provider Notes (Signed)
CSN: [id number]     Arrival date & time 06/28/15  1048 History   First MD Initiated Contact with Patient 06/28/15 1103     Chief Complaint  Patient presents with  . Allergic Reaction    "sensitive to scents, got a whiff of something"   Brandi Morris is a 39 y.o. female with a history of multiple allergies and allergic reactions who presents to the ED via EMS after she reports an allergic reaction to smelling some perfume around 10 am today, about one hour prior to arrival. She reports getting a whiff of perfume around 10 am and then having her typical reaction of coughing, runny nose, and post nasal drip. She reports that she sees an allergist who advised her to use her epi pen when this reaction occurs, but she has lost her epi pen. She reports using her albuterol MDI without relief. Upon EMS arrival the patient is a 50 mg of Benadryl IV. Upon my evaluation the patient reports feeling almost completely back to baseline. She reports feeling like the IV benadryl helped a lot. She currently reports some slight postnasal drip, runny nose and slight shortness of breath. She denies fevers, chills, wheezing, rashes, abdominal pain, nausea, vomiting, ear pain, tongue swelling, lip swelling or difficulty swallowing.   (Consider location/radiation/quality/duration/timing/severity/associated sxs/prior Treatment) HPI  Past Medical History  Diagnosis Date  . Migraine   . Kidney stone   . Interstitial cystitis   . Multiple allergies   . Endometriosis   . Hyperlipidemia   . Asthma     Exercise induced  . Cancer     Cervical  . 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 Rhinoplasty    . Bladder augmentation    . Tubal ligation    . Knee surgery    . Tonsillectomy    . Cholecystectomy    . Ablation    . Leep     Family History  Problem Relation Age of Onset  . Heart disease Mother   . COPD Mother   . Diabetes Father   .  Hypertension Father   . Stroke Father   . Heart attack Father   . Breast cancer Maternal Grandmother   . Cancer Maternal Grandfather     melanoma  . Heart disease Paternal Grandmother   . Stroke Paternal Grandmother   . Heart attack Paternal Grandmother   . Heart disease Paternal Grandfather   . Stroke Paternal Grandfather   . Heart attack Paternal Grandfather    Social History  Substance Use Topics  . Smoking status: Current Every Day Smoker -- 0.50 packs/day for 25 years    Types: Cigarettes  . Smokeless tobacco: Never Used  . Alcohol Use: Yes     Comment: rarely   OB History    Gravida Para Term Preterm AB TAB SAB Ectopic Multiple Living   5 3 3  2  2   3      Review of Systems  Constitutional: Negative for fever and chills.  HENT: Positive for postnasal drip, rhinorrhea and sneezing. Negative for congestion, drooling, ear pain, facial swelling, sore throat and trouble swallowing.   Eyes: Negative for redness and visual disturbance.  Respiratory: Positive for cough and shortness of breath. Negative for chest tightness and wheezing.   Cardiovascular: Negative for chest pain and palpitations.  Gastrointestinal: Negative for nausea, vomiting and abdominal pain.  Genitourinary: Negative for dysuria.  Musculoskeletal: Negative for back pain and neck pain.  Skin: Negative for color change and rash.  Neurological: Negative for light-headedness and headaches.      Allergies  Bee venom; Lavender oil; Singulair; Amitriptyline; Entex; Etodolac; Flexeril; Iodine; Mushroom extract complex; Robaxin; and Shellfish allergy  Home Medications   Prior to Admission medications   Medication Sig Start Date End Date Taking? Authorizing Provider  albuterol (PROVENTIL HFA;VENTOLIN HFA) 108 (90 BASE) MCG/ACT inhaler Inhale 1-2 puffs into the lungs every 6 (six) hours as needed for wheezing or shortness of breath. 06/15/15  Yes Golden Circle, FNP  busPIRone (BUSPAR) 10 MG tablet Take 1  tablet (10 mg total) by mouth 3 (three) times daily. 06/15/15  Yes Golden Circle, FNP  diphenhydrAMINE (BENADRYL) 25 MG tablet Take 1 tablet (25 mg total) by mouth every 6 (six) hours. Patient taking differently: Take 25-50 mg by mouth every 6 (six) hours as needed for itching or allergies.  05/13/15  Yes Carlisle Cater, PA-C  Fluticasone Furoate-Vilanterol (BREO ELLIPTA) 100-25 MCG/INH AEPB Inhale 1 puff into the lungs daily. 06/15/15  Yes Golden Circle, FNP  promethazine (PHENERGAN) 25 MG tablet Take 1 tablet (25 mg total) by mouth every 8 (eight) hours as needed for nausea or vomiting. 06/15/15  Yes Golden Circle, FNP  rizatriptan (MAXALT) 10 MG tablet Take 0.5-1 tablets (5-10 mg total) by mouth as needed for migraine. May repeat in 2 hours if needed 06/15/15  Yes Golden Circle, FNP  sertraline (ZOLOFT) 50 MG tablet Take 1 tablet (50 mg total) by mouth daily. 06/15/15  Yes Golden Circle, FNP   BP 110/69 mmHg  Pulse 64  Temp(Src) 98.5 F (36.9 C) (Oral)  Resp 18  Ht 5\' 3"  (1.6 m)  Wt 173 lb (78.472 kg)  BMI 30.65 kg/m2  SpO2 99% Physical Exam  Constitutional: She is oriented to person, place, and time. She appears well-developed and well-nourished. No distress.  HENT:  Head: Normocephalic and atraumatic.  Right Ear: External ear normal.  Left Ear: External ear normal.  Nose: Nose normal.  Mouth/Throat: Oropharynx is clear and moist. No oropharyngeal exudate.  No posterior oropharyngeal erythema or edema. Uvula is midline without it. No tongue swelling noted. No drooling noted. Tongue protrusion is normal. Bilateral tympanic membranes are pearly-gray without erythema or loss of landmarks.   Eyes: Conjunctivae are normal. Pupils are equal, round, and reactive to light. Right eye exhibits no discharge. Left eye exhibits no discharge.  Neck: Normal range of motion. Neck supple. No JVD present. No tracheal deviation present.  Cardiovascular: Normal rate, regular rhythm, normal heart  sounds and intact distal pulses.  Exam reveals no gallop and no friction rub.   No murmur heard. Pulmonary/Chest: Effort normal and breath sounds normal. No stridor. No respiratory distress. She has no wheezes. She has no rales.  Lungs are clear to auscultation bilaterally. No rales or rhonchi. No wheezing noted. Patient speaking in full sentences.  Abdominal: Soft. She exhibits no distension. There is no tenderness.  Musculoskeletal: She exhibits no edema or tenderness.  Lymphadenopathy:    She has no cervical adenopathy.  Neurological: She is alert and oriented to person, place, and time. Coordination normal.  Skin: Skin is warm and dry. No rash noted. She is not diaphoretic. No erythema. No pallor.  No rashes or hives noted to her body.  Psychiatric: She has a normal mood and affect. Her behavior is normal.  Nursing note and vitals reviewed.   ED Course  Procedures (including critical care time) Labs Review Labs Reviewed - No data to display  Imaging Review No results found.   EKG Interpretation None      Filed Vitals:   06/28/15 1051 06/28/15 1100 06/28/15 1408  BP:  107/69 110/69  Pulse:  94 64  Temp:  98.5 F (36.9 C)   TempSrc:  Oral   Resp:  18 18  Height:  5\' 3"  (1.6 m)   Weight:  173 lb (78.472 kg)   SpO2: 99% 98% 99%     MDM   Meds given in ED:  Medications  methylPREDNISolone sodium succinate (SOLU-MEDROL) 125 mg/2 mL injection 125 mg (125 mg Intravenous Given 06/28/15 1128)    Discharge Medication List as of 06/28/2015  1:58 PM      Final diagnoses:  Allergic reaction, initial encounter    This is a 39 y.o. female with a history of multiple allergies and allergic reactions who presents to the ED via EMS after she reports an allergic reaction to smelling some perfume around 10 am today, about one hour prior to arrival. She reports getting a whiff of perfume around 10 am and then having her typical reaction of coughing, runny nose, and post nasal drip.  She reports that she sees an allergist who advised her to use her epi pen when this reaction occurs, but she has lost her epi pen. She reports using her albuterol MDI without relief. Upon EMS arrival the patient is a 50 mg of Benadryl IV. Upon my evaluation the patient reports feeling almost completely back to baseline. She reports feeling like the IV benadryl helped a lot. She currently reports some slight postnasal drip, runny nose and slight shortness of breath. . On exam the patient is afebrile and nontoxic appearing.Her lungs are clear to auscultation bilaterally. Her oxygen saturation is 100% on room air.  No wheezing is noted. Patient is handling her secretions without difficulty and no drooling is noted. There is no posterior oropharyngeal erythema or edema. No lip or tongue swelling noted. Patient reports feeling almost back to baseline. Patient provided with Solu-Medrol in the ED. Plan is to observe for 4 hours post exposure. Patient agrees with this plan.  At 4 hours post exposure the patient reports feeling back to baseline and has no complaints prior to discharge. She reports having refills at the pharmacy for her epi pen and will go to the pharmacy now to refill. I advised the patient to follow-up with their primary care provider and allergist this week. I advised the patient to return to the emergency department with new or worsening symptoms or new concerns. The patient verbalized understanding and agreement with plan.    This patient was discussed with Dr. Thomasene Lot who agrees with assessment and plan.    Waynetta Pean, PA-C 06/29/15 0957  Courteney Julio Alm, MD 07/01/15 9826

## 2015-06-28 NOTE — ED Notes (Signed)
Pt denies new complaints.  Remains well appearing.  NAD.

## 2015-06-28 NOTE — ED Notes (Signed)
PER EMS - pt report sensitive to scents and "got a whiff of something".  Per EMS, pt with intermittent symptoms of raspy voice and cough.  Distractable.  Speaking full/clear sentences, rr even/unlab. LSCTAB.  No resp involvement, no hives or other symptoms of reaction.    50mg  benadryl given x1

## 2015-06-28 NOTE — ED Notes (Signed)
Initial Contact - pt A+Ox4, pt sts "sensitive to smells and it makes me produce a lot of liquid in my nose and throat and lungs".  Pt reports "got a whiff of something".  Pt reports c/o 5/10 "pain in my lungs".  Pt speaking full/clear sentences, rr even/un-lab, no resp distress, no cough, no adventitious lung sounds noted.  Pt intermittently with upper airway noise noted.  Sats maintained upper 90s RA.  No difficulty clearing secretions or maintaining airway.  Skin PWD.  MAEI, self repositioning for comfort.  NAD.

## 2015-06-30 ENCOUNTER — Telehealth: Payer: Self-pay | Admitting: Family

## 2015-06-30 DIAGNOSIS — R0602 Shortness of breath: Secondary | ICD-10-CM

## 2015-06-30 NOTE — Telephone Encounter (Signed)
Pt called in and would like to know if Marya Amsler can call in another script for an inhaler for her and also she wants him to know that the Zoloft is making her panic attacks worse .    Best number -[phone]

## 2015-07-02 MED ORDER — ALBUTEROL SULFATE HFA 108 (90 BASE) MCG/ACT IN AERS: 1.0000 | INHALATION_SPRAY | Freq: Four times a day (QID) | RESPIRATORY_TRACT | Status: DC | PRN

## 2015-07-02 NOTE — Addendum Note (Signed)
Addended by: Mauricio Po D on: 07/02/2015 09:11 PM   Modules accepted: Orders, Medications

## 2015-07-02 NOTE — Telephone Encounter (Signed)
Inhaler sent to pharmacy. Please have her discontinue the Zoloft. Is the  Buspar helping? Please have her continue to follow up in 2 weeks or sooner if needed.

## 2015-07-03 NOTE — Telephone Encounter (Signed)
Please continue the Buspar and discontinue the Zoloft.

## 2015-07-03 NOTE — Telephone Encounter (Signed)
Buspar is working as long as she is not taking the zoloft.

## 2015-07-03 NOTE — Telephone Encounter (Signed)
Pt aware.

## 2015-07-04 ENCOUNTER — Ambulatory Visit (INDEPENDENT_AMBULATORY_CARE_PROVIDER_SITE_OTHER): Payer: 59 | Admitting: Obstetrics & Gynecology

## 2015-07-04 ENCOUNTER — Encounter: Payer: Self-pay | Admitting: Obstetrics & Gynecology

## 2015-07-04 VITALS — BP 114/80 | HR 90 | Wt 172.0 lb

## 2015-07-04 DIAGNOSIS — R102 Pelvic and perineal pain: Secondary | ICD-10-CM

## 2015-07-04 DIAGNOSIS — G8929 Other chronic pain: Secondary | ICD-10-CM

## 2015-07-04 DIAGNOSIS — N949 Unspecified condition associated with female genital organs and menstrual cycle: Secondary | ICD-10-CM | POA: Diagnosis not present

## 2015-07-04 DIAGNOSIS — N870 Mild cervical dysplasia: Secondary | ICD-10-CM

## 2015-07-04 NOTE — Progress Notes (Signed)
   Subjective:    Patient ID: Brandi Morris, female    DOB: 1976-09-12, 39 y.o.   MRN: [id number]  HPI  This 38 yo SW  P3 is here because she has decided that she is unwilling to live with her pelvic pain anymore. She is getting no relief from Tylenol and IBU. She had an ablation in the past and has no periods. She was diagnosed by Dr. Harle Battiest via laparoscopy with endometriosis. She was diagnosed with IC via cystoscopy as well. She has pain with sex all the time. Still orgasmic. Has to use lube.  Review of Systems She has had 3 vaginal deliveries    Objective:   Physical Exam  WNWHWFNAD Abd- benign EG, vagina, cervix- normal Tender uterus, mobile, mild descensus NSSA, tender bimanual exam but no pelvic masses      Assessment & Plan:  I have cautioned her that with her many previous laparoscopies, she is at significant risk of surgical complications including but not limited to damage to bowel, bladder, and ureters. She also understands that if her ovaries are removed, then she would be postmenopausal and with her h/o TIA, I would not give her Estrogen. So at this point I will plan to leave the ovaries in if they look normal.

## 2015-07-10 ENCOUNTER — Encounter: Payer: Self-pay | Admitting: *Deleted

## 2015-07-11 ENCOUNTER — Ambulatory Visit (INDEPENDENT_AMBULATORY_CARE_PROVIDER_SITE_OTHER): Payer: 59 | Admitting: Internal Medicine

## 2015-07-11 DIAGNOSIS — J4530 Mild persistent asthma, uncomplicated: Secondary | ICD-10-CM | POA: Diagnosis not present

## 2015-07-11 LAB — PULMONARY FUNCTION TEST
DL/VA % pred: 82 %
DL/VA: 3.84 ml/min/mmHg/L
DLCO unc % pred: 92 %
DLCO unc: 21.18 ml/min/mmHg
FEF 25-75 POST: 3.88 L/s
FEF 25-75 PRE: 3.44 L/s
FEF2575-%CHANGE-POST: 12 %
FEF2575-%PRED-PRE: 112 %
FEF2575-%Pred-Post: 126 %
FEV1-%Change-Post: 3 %
FEV1-%PRED-PRE: 114 %
FEV1-%Pred-Post: 118 %
FEV1-PRE: 3.35 L
FEV1-Post: 3.47 L
FEV1FVC-%Change-Post: 2 %
FEV1FVC-%Pred-Pre: 99 %
FEV6-%CHANGE-POST: 1 %
FEV6-%PRED-PRE: 116 %
FEV6-%Pred-Post: 117 %
FEV6-Post: 4.14 L
FEV6-Pre: 4.1 L
FEV6FVC-%Change-Post: 0 %
FEV6FVC-%Pred-Post: 102 %
FEV6FVC-%Pred-Pre: 102 %
FVC-%CHANGE-POST: 0 %
FVC-%PRED-PRE: 114 %
FVC-%Pred-Post: 115 %
FVC-Post: 4.14 L
FVC-Pre: 4.1 L
POST FEV6/FVC RATIO: 100 %
PRE FEV1/FVC RATIO: 82 %
Post FEV1/FVC ratio: 84 %
Pre FEV6/FVC Ratio: 100 %

## 2015-07-11 NOTE — Progress Notes (Signed)
PFT performed today. 

## 2015-07-12 ENCOUNTER — Telehealth: Payer: Self-pay | Admitting: Family

## 2015-07-12 ENCOUNTER — Ambulatory Visit (HOSPITAL_COMMUNITY): Payer: 59

## 2015-07-12 ENCOUNTER — Encounter: Payer: Self-pay | Admitting: Family

## 2015-07-12 NOTE — Telephone Encounter (Signed)
MyChart message sent to patient with results

## 2015-07-12 NOTE — Telephone Encounter (Signed)
Shouldn't pt call pulmonology?

## 2015-07-12 NOTE — Telephone Encounter (Signed)
Pt was upstairs yesterday for a test and they told her to contact us regarding her results and it sounded urgent to her. Can you please call her at [phone]. asap

## 2015-07-13 ENCOUNTER — Ambulatory Visit (INDEPENDENT_AMBULATORY_CARE_PROVIDER_SITE_OTHER): Payer: 59 | Admitting: Internal Medicine

## 2015-07-13 ENCOUNTER — Ambulatory Visit: Payer: 59 | Admitting: Family

## 2015-07-13 ENCOUNTER — Encounter: Payer: Self-pay | Admitting: Internal Medicine

## 2015-07-13 VITALS — BP 118/72 | HR 93 | Ht 63.0 in | Wt 176.2 lb

## 2015-07-13 DIAGNOSIS — J45991 Cough variant asthma: Secondary | ICD-10-CM

## 2015-07-13 DIAGNOSIS — E669 Obesity, unspecified: Secondary | ICD-10-CM | POA: Diagnosis not present

## 2015-07-13 DIAGNOSIS — R058 Other specified cough: Secondary | ICD-10-CM

## 2015-07-13 DIAGNOSIS — R05 Cough: Secondary | ICD-10-CM

## 2015-07-13 DIAGNOSIS — Z0289 Encounter for other administrative examinations: Secondary | ICD-10-CM

## 2015-07-13 MED ORDER — TRAMADOL HCL 50 MG PO TABS: ORAL_TABLET | ORAL | Status: DC

## 2015-07-13 MED ORDER — FAMOTIDINE 20 MG PO TABS: ORAL_TABLET | ORAL | Status: DC

## 2015-07-13 MED ORDER — MOMETASONE FURO-FORMOTEROL FUM 100-5 MCG/ACT IN AERO: INHALATION_SPRAY | RESPIRATORY_TRACT | Status: DC

## 2015-07-13 MED ORDER — PANTOPRAZOLE SODIUM 40 MG PO TBEC: 40.0000 mg | DELAYED_RELEASE_TABLET | Freq: Every day | ORAL | Status: DC

## 2015-07-13 NOTE — Progress Notes (Signed)
Subjective:     Patient ID: Brandi Morris, female   DOB: April 22, 1976,    MRN: [id number]  HPI  28 yowf active smoker with paroxysms of cough to point of can't breath x 2012 assoc with severe rhinitis/sense of pnds   Self referred to pulmonary clinic 07/13/2015     07/13/2015 1st [facility] office visit/ Carey Lafon  Re uacs/irritant provoked  vs cough variant asthma Chief Complaint  Patient presents with  . Pulmonary Consult    Self referral. Pt states dxed with Asthma as a child but never had an issue with her breathing until approx 4 yrs ago.  She gets out of breath when exposed to strong smells and also when she walks up stairs.  She also c/o cough- prod with clear sputum.    acute symptoms abruptly started 4 y prior to OV  p exp to perfume at funeral home > admit to [facility] and gradually worse since then with fits of daily coughing worse on BREO and using saba q 6-8h  s relief.  Also sob with activity with subjective wheezing with activity   No obvious day to day or daytime variability or assoc or cp or chest tightness,  Or overt  hb symptoms. No unusual exp hx or h/o childhood pna/ asthma or knowledge of premature birth.  Sleeping ok without nocturnal  or early am exacerbation  of respiratory  c/o's or need for noct saba. Also denies any obvious fluctuation of symptoms with weather or environmental changes or other aggravating or alleviating factors except as outlined above   Current Medications, Allergies, Complete Past Medical History, Past Surgical History, Family History, and Social History were reviewed in [employer] record.  ROS  The following are not active complaints unless bolded sore throat, dysphagia, dental problems, itching, sneezing,  nasal congestion or excess/ purulent secretions, ear ache,   fever, chills, sweats, unintended wt loss, classically pleuritic or exertional cp, hemoptysis,  orthopnea pnd or leg swelling, presyncope, palpitations, abdominal  pain, anorexia, nausea, vomiting, diarrhea  or change in bowel or bladder habits, change in stools or urine, dysuria,hematuria,  rash, arthralgias, visual complaints, headache, numbness, weakness or ataxia or problems with walking or coordination,  change in mood/affect or memory.         Review of Systems     Objective:   Physical Exam  amb wf nad barking quality cough    Wt Readings from Last 3 Encounters:  07/13/15 176 lb 3.2 oz (79.924 kg)  07/04/15 172 lb (78.019 kg)  06/28/15 173 lb (78.472 kg)    Vital signs reviewed   HEENT: nl dentition, turbinates, and orophanx. Nl external ear canals without cough reflex   NECK :  without JVD/Nodes/TM/ nl carotid upstrokes bilaterally   LUNGS: no acc muscle use, clear to A and P bilaterally without cough on insp or exp maneuvers   CV:  RRR  no s3 or murmur or increase in P2, no edema   ABD:  soft and nontender with nl excursion in the supine position. No bruits or organomegaly, bowel sounds nl  MS:  warm without deformities, calf tenderness, cyanosis or clubbing  SKIN: warm and dry without lesions    NEURO:  alert, approp, no deficits              Assessment:

## 2015-07-13 NOTE — Patient Instructions (Addendum)
Stop [employer] dulera 100 Take 2 puffs first thing in am and then another 2 puffs about 12 hours later.    Only use your albuterol as a rescue medication to be used if you can't catch your breath by resting or doing a relaxed purse lip breathing pattern.  - The less you use it, the better it will work when you need it. - Ok to use up to 2 puffs  every 4 hours if you must but call for immediate appointment if use goes up over your usual need - Don't leave home without it !!  (think of it like the spare tire for your car)   Take delsym two tsp every 12 hours and supplement if needed with  tramadol 50 mg up to 1 every 4 hours to suppress the urge to cough. Swallowing water or using ice chips/non mint and menthol containing candies (such as lifesavers or sugarless jolly ranchers) are also effective.  You should rest your voice and avoid activities that you know make you cough.  Once you have eliminated the cough for 3 straight days try reducing the tramadol first,  then the delsym as tolerated.    GERD (REFLUX)  is an extremely common cause of respiratory symptoms just like yours , many times with no obvious heartburn at all.    It can be treated with medication, but also with lifestyle changes including elevation of the head of your bed (ideally with 6 inch  bed blocks),  Smoking cessation, avoidance of late meals, excessive alcohol, and avoid fatty foods, chocolate, peppermint, colas, red wine, and acidic juices such as orange juice.  NO MINT OR MENTHOL PRODUCTS SO NO COUGH DROPS  USE SUGARLESS CANDY INSTEAD (Jolley ranchers or Stover's or Life Savers) or even ice chips will also do - the key is to swallow to prevent all throat clearing. NO OIL BASED VITAMINS - use powdered substitutes.  Pantoprazole (protonix) 40 mg   Take  30-60 min before first meal of the day and Pepcid (famotidine)  20 mg one @  bedtime until return to office - this is the best way to tell whether stomach acid is  contributing to your problem.    Please schedule a follow up office visit in 2 weeks, sooner if needed

## 2015-07-14 ENCOUNTER — Encounter: Payer: Self-pay | Admitting: Internal Medicine

## 2015-07-14 DIAGNOSIS — R05 Cough: Secondary | ICD-10-CM | POA: Insufficient documentation

## 2015-07-14 DIAGNOSIS — J45991 Cough variant asthma: Secondary | ICD-10-CM | POA: Insufficient documentation

## 2015-07-14 DIAGNOSIS — R058 Other specified cough: Secondary | ICD-10-CM | POA: Insufficient documentation

## 2015-07-14 NOTE — Assessment & Plan Note (Addendum)
07/11/15  PFT's no airflow obst including FEF 25-75   This is in the ddx but can't really be sure about it until we eliminate the upper airway component.  If she does have asthma, it is DDX of  difficult airways management all start with A and  include Adherence, Ace Inhibitors, Acid Reflux, Active Sinus Disease, Alpha 1 Antitripsin deficiency, Anxiety masquerading as Airways dz,  ABPA,  allergy(esp in young), Aspiration (esp in elderly), Adverse effects of meds,  Active smokers, A bunch of PE's (a small clot burden can't cause this syndrome unless there is already severe underlying pulm or vascular dz with poor reserve) plus two Bs  = Bronchiectasis and Beta blocker use..and one C= CHF   Adherence is always the initial "prime suspect" and is a multilayered concern that requires a "trust but verify" approach in every patient - starting with knowing how to use medications, especially inhalers, correctly, keeping up with refills and understanding the fundamental difference between maintenance and prns vs those medications only taken for a very short course and then stopped and not refilled.  - The proper method of use, as well as anticipated side effects, of a metered-dose inhaler are discussed and demonstrated to the patient. Improved effectiveness after extensive coaching during this visit to a level of approximately  75% > try dulera 100 2bid  ? Acid (or non-acid) GERD > always difficult to exclude as up to 75% of pts in some series report no assoc GI/ Heartburn symptoms> rec max (24h)  acid suppression and diet restrictions/ reviewed and instructions given in writing.  ? Adverse effect from dpi > try off breo   See instructions for specific recommendations which were reviewed directly with the patient who was given a copy with highlighter outlining the key components.

## 2015-07-14 NOTE — Assessment & Plan Note (Signed)
The most common causes of chronic cough in immunocompetent adults include the following: upper airway cough syndrome (UACS), previously referred to as postnasal drip syndrome (PNDS), which is caused by variety of rhinosinus conditions; (2) asthma; (3) GERD; (4) chronic bronchitis from cigarette smoking or other inhaled environmental irritants; (5) nonasthmatic eosinophilic bronchitis; and (6) bronchiectasis.   These conditions, singly or in combination, have accounted for up to 94% of the causes of chronic cough in prospective studies.   Other conditions have constituted no >6% of the causes in prospective studies These have included bronchogenic carcinoma, chronic interstitial pneumonia, sarcoidosis, left ventricular failure, ACEI-induced cough, and aspiration from a condition associated with pharyngeal dysfunction.    Chronic cough is often simultaneously caused by more than one condition. A single cause has been found from 38 to 82% of the time, multiple causes from 18 to 62%. Multiply caused cough has been the result of three diseases up to 42% of the time.       Based on hx and exam, this is most likely:  Classic Upper airway cough syndrome, so named because it's frequently impossible to sort out how much is  CR/sinusitis with freq throat clearing (which can be related to primary GERD)   vs  causing  secondary (" extra esophageal")  GERD from wide swings in gastric pressure that occur with throat clearing, often  promoting self use of mint and menthol lozenges that reduce the lower esophageal sphincter tone and exacerbate the problem further in a cyclical fashion.   These are the same pts (now being labeled as having "irritable larynx syndrome" by some cough centers) who not infrequently have a history of having failed to tolerate ace inhibitors,  dry powder inhalers or biphosphonates or report having atypical reflux symptoms that don't respond to standard doses of PPI , and are easily confused as  having aecopd or asthma flares by even experienced allergists/ pulmonologists.   The first step is to maximize acid suppression and eliminate cyclical coughing/ DPI inhaler (see cough variant asthma a/p)  then regroup if the cough persists.  I had an extended discussion with the patient reviewing all relevant studies completed to date and  lasting 35 min  1) Explained: The standardized cough guidelines published in Chest by Lissa Morales in 2006 are still the best available and consist of a multiple step process (up to 12!) , not a single office visit,  and are intended  to address this problem logically,  with an alogrithm dependent on response to empiric treatment at  each progressive step  to determine a specific diagnosis with  minimal addtional testing needed. Therefore if adherence is an issue or can't be accurately verified,  it's very unlikely the standard evaluation and treatment will be successful here.    Furthermore, response to therapy (other than acute cough suppression, which should only be used short term with avoidance of narcotic containing cough syrups if possible), can be a gradual process for which the patient may perceive immediate benefit.  Unlike going to an eye doctor where the best perscription is almost always the first one and is immediately effective, this is almost never the case in the management of chronic cough syndromes. Therefore the patient needs to commit up front to consistently adhere to recommendations  for up to 6 weeks of therapy directed at the likely underlying problem(s) before the response can be reasonably evaluated.     2) Each maintenance medication was reviewed in detail including most importantly the  difference between maintenance and prns and under what circumstances the prns are to be triggered using an action plan format that is not reflected in the computer generated alphabetically organized AVS.    Please see instructions for details which were  reviewed in writing and the patient given a copy highlighting the part that I personally wrote and discussed at today's ov.   See instructions for specific recommendations which were reviewed directly with the patient who was given a copy with highlighter outlining the key components.

## 2015-07-15 ENCOUNTER — Encounter: Payer: Self-pay | Admitting: Internal Medicine

## 2015-07-15 DIAGNOSIS — E669 Obesity, unspecified: Secondary | ICD-10-CM | POA: Insufficient documentation

## 2015-07-15 NOTE — Assessment & Plan Note (Addendum)
pfts 07/11/15 ERV 50% c/w body habitus  Body mass index is 31.22 kg/(m^2).  Lab Results  Component Value Date   TSH 0.62 10/17/2006     Contributing to gerd tendency/ doe/reviewed need  achieve and maintain neg calorie balance > defer f/u primary care including intermittently monitoring thyroid status

## 2015-07-17 ENCOUNTER — Ambulatory Visit: Payer: 59 | Admitting: Family

## 2015-07-19 ENCOUNTER — Ambulatory Visit (HOSPITAL_COMMUNITY): Payer: 59

## 2015-07-20 ENCOUNTER — Telehealth: Payer: Self-pay | Admitting: Family

## 2015-07-20 NOTE — Telephone Encounter (Signed)
Patient no showed for 1 month fu this week.  Please advise.

## 2015-07-20 NOTE — Telephone Encounter (Signed)
May reschedule if she calls back - believe this is no show #2

## 2015-07-25 ENCOUNTER — Encounter (HOSPITAL_COMMUNITY): Payer: Self-pay | Admitting: *Deleted

## 2015-07-26 ENCOUNTER — Emergency Department (HOSPITAL_COMMUNITY): Payer: 59

## 2015-07-26 ENCOUNTER — Emergency Department (HOSPITAL_COMMUNITY): Payer: 59 | Admitting: Certified Registered"

## 2015-07-26 ENCOUNTER — Ambulatory Visit (HOSPITAL_COMMUNITY)
Admission: EM | Admit: 2015-07-26 | Discharge: 2015-07-26 | Disposition: A | Discharge status: Home / Self Care | Payer: 59 | Attending: Emergency Medicine | Admitting: Emergency Medicine

## 2015-07-26 ENCOUNTER — Encounter (HOSPITAL_COMMUNITY)
Admission: EM | Disposition: A | Discharge status: Home / Self Care | Payer: Self-pay | Source: Home / Self Care | Attending: Emergency Medicine

## 2015-07-26 ENCOUNTER — Encounter (HOSPITAL_COMMUNITY): Payer: Self-pay | Admitting: Emergency Medicine

## 2015-07-26 DIAGNOSIS — E785 Hyperlipidemia, unspecified: Secondary | ICD-10-CM | POA: Diagnosis not present

## 2015-07-26 DIAGNOSIS — Z7951 Long term (current) use of inhaled steroids: Secondary | ICD-10-CM | POA: Diagnosis not present

## 2015-07-26 DIAGNOSIS — S6702XA Crushing injury of left thumb, initial encounter: Secondary | ICD-10-CM | POA: Diagnosis not present

## 2015-07-26 DIAGNOSIS — F17210 Nicotine dependence, cigarettes, uncomplicated: Secondary | ICD-10-CM | POA: Insufficient documentation

## 2015-07-26 DIAGNOSIS — Z8673 Personal history of transient ischemic attack (TIA), and cerebral infarction without residual deficits: Secondary | ICD-10-CM | POA: Insufficient documentation

## 2015-07-26 DIAGNOSIS — J45909 Unspecified asthma, uncomplicated: Secondary | ICD-10-CM | POA: Diagnosis not present

## 2015-07-26 DIAGNOSIS — Z79899 Other long term (current) drug therapy: Secondary | ICD-10-CM | POA: Insufficient documentation

## 2015-07-26 DIAGNOSIS — S68522A Partial traumatic transphalangeal amputation of left thumb, initial encounter: Secondary | ICD-10-CM | POA: Diagnosis present

## 2015-07-26 DIAGNOSIS — W240XXA Contact with lifting devices, not elsewhere classified, initial encounter: Secondary | ICD-10-CM | POA: Insufficient documentation

## 2015-07-26 DIAGNOSIS — S61012A Laceration without foreign body of left thumb without damage to nail, initial encounter: Secondary | ICD-10-CM

## 2015-07-26 HISTORY — PX: TENDON REPAIR: SHX5111

## 2015-07-26 LAB — CBC
HEMATOCRIT: 40.3 % (ref 36.0–46.0)
HEMOGLOBIN: 13.3 g/dL (ref 12.0–15.0)
MCH: 30.1 pg (ref 26.0–34.0)
MCHC: 33 g/dL (ref 30.0–36.0)
MCV: 91.2 fL (ref 78.0–100.0)
Platelets: 242 10*3/uL (ref 150–400)
RBC: 4.42 MIL/uL (ref 3.87–5.11)
RDW: 13.5 % (ref 11.5–15.5)
WBC: 8.3 10*3/uL (ref 4.0–10.5)

## 2015-07-26 LAB — BASIC METABOLIC PANEL
ANION GAP: 6 (ref 5–15)
BUN: 10 mg/dL (ref 6–20)
CHLORIDE: 106 mmol/L (ref 101–111)
CO2: 24 mmol/L (ref 22–32)
Calcium: 8.5 mg/dL — ABNORMAL LOW (ref 8.9–10.3)
Creatinine, Ser: 0.77 mg/dL (ref 0.44–1.00)
GFR calc Af Amer: >60 mL/min (ref >60)
GFR calc non Af Amer: >60 mL/min (ref >60)
GLUCOSE: 92 mg/dL (ref 65–99)
POTASSIUM: 3.7 mmol/L (ref 3.5–5.1)
Sodium: 136 mmol/L (ref 135–145)

## 2015-07-26 SURGERY — TENDON REPAIR
Anesthesia: General | Site: Hand | Laterality: Left

## 2015-07-26 MED ORDER — OXYCODONE HCL 5 MG/5ML PO SOLN: 5.0000 mg | Freq: Once | ORAL | Status: DC | PRN

## 2015-07-26 MED ORDER — BUPIVACAINE HCL (PF) 0.25 % IJ SOLN
INTRAMUSCULAR | Status: AC
  Filled 2015-07-26: qty 30

## 2015-07-26 MED ORDER — OXYCODONE-ACETAMINOPHEN 5-325 MG PO TABS: 1.0000 | ORAL_TABLET | ORAL | Status: DC | PRN

## 2015-07-26 MED ORDER — MIDAZOLAM HCL 2 MG/2ML IJ SOLN
INTRAMUSCULAR | Status: AC
  Filled 2015-07-26: qty 4

## 2015-07-26 MED ORDER — ONDANSETRON HCL 4 MG/2ML IJ SOLN
INTRAMUSCULAR | Status: AC
  Filled 2015-07-26: qty 2

## 2015-07-26 MED ORDER — PROPOFOL 10 MG/ML IV BOLUS
INTRAVENOUS | Status: DC | PRN
  Administered 2015-07-26: 200 mg via INTRAVENOUS

## 2015-07-26 MED ORDER — SODIUM BICARBONATE 4 % IV SOLN
INTRAVENOUS | Status: DC | PRN
  Administered 2015-07-26: 1 mL via INTRAVENOUS

## 2015-07-26 MED ORDER — MORPHINE SULFATE (PF) 4 MG/ML IV SOLN
4.0000 mg | Freq: Once | INTRAVENOUS | Status: AC
  Administered 2015-07-26: 4 mg via INTRAVENOUS
  Filled 2015-07-26: qty 1

## 2015-07-26 MED ORDER — FENTANYL CITRATE (PF) 100 MCG/2ML IJ SOLN
INTRAMUSCULAR | Status: DC | PRN
  Administered 2015-07-26: 50 ug via INTRAVENOUS

## 2015-07-26 MED ORDER — LACTATED RINGERS IV SOLN
INTRAVENOUS | Status: DC | PRN
  Administered 2015-07-26: 21:00:00 via INTRAVENOUS

## 2015-07-26 MED ORDER — LIDOCAINE-EPINEPHRINE 1 %-1:100000 IJ SOLN
INTRAMUSCULAR | Status: DC | PRN
  Administered 2015-07-26: 10 mL

## 2015-07-26 MED ORDER — SODIUM BICARBONATE 4 % IV SOLN
5.0000 mL | INTRAVENOUS | Status: DC
  Filled 2015-07-26: qty 5

## 2015-07-26 MED ORDER — CEFAZOLIN SODIUM-DEXTROSE 2-3 GM-% IV SOLR
2.0000 g | Freq: Once | INTRAVENOUS | Status: AC
  Administered 2015-07-26: 2 g via INTRAVENOUS
  Filled 2015-07-26: qty 50

## 2015-07-26 MED ORDER — FENTANYL CITRATE (PF) 250 MCG/5ML IJ SOLN
INTRAMUSCULAR | Status: AC
  Filled 2015-07-26: qty 5

## 2015-07-26 MED ORDER — ONDANSETRON HCL 4 MG/2ML IJ SOLN
INTRAMUSCULAR | Status: DC | PRN
  Administered 2015-07-26: 4 mg via INTRAVENOUS

## 2015-07-26 MED ORDER — OXYCODONE HCL 5 MG PO TABS: 5.0000 mg | ORAL_TABLET | Freq: Once | ORAL | Status: DC | PRN

## 2015-07-26 MED ORDER — LIDOCAINE-EPINEPHRINE 1 %-1:100000 IJ SOLN
INTRAMUSCULAR | Status: AC
  Filled 2015-07-26: qty 1

## 2015-07-26 MED ORDER — FENTANYL CITRATE (PF) 100 MCG/2ML IJ SOLN: 25.0000 ug | INTRAMUSCULAR | Status: DC | PRN

## 2015-07-26 MED ORDER — ONDANSETRON HCL 4 MG/2ML IJ SOLN: 4.0000 mg | Freq: Four times a day (QID) | INTRAMUSCULAR | Status: DC | PRN

## 2015-07-26 MED ORDER — LIDOCAINE HCL (CARDIAC) 20 MG/ML IV SOLN
INTRAVENOUS | Status: DC | PRN
  Administered 2015-07-26: 70 mg via INTRAVENOUS

## 2015-07-26 SURGICAL SUPPLY — 49 items
BANDAGE ELASTIC 4 VELCRO ST LF (GAUZE/BANDAGES/DRESSINGS) IMPLANT
BNDG CMPR 9X4 STRL LF SNTH (GAUZE/BANDAGES/DRESSINGS) ×1
BNDG COHESIVE 1X5 TAN STRL LF (GAUZE/BANDAGES/DRESSINGS) ×1 IMPLANT
BNDG ESMARK 4X9 LF (GAUZE/BANDAGES/DRESSINGS) ×2 IMPLANT
BNDG GAUZE ELAST 4 BULKY (GAUZE/BANDAGES/DRESSINGS) IMPLANT
CANISTER SUCTION WELLS/JOHNSON (MISCELLANEOUS) ×1 IMPLANT
CORDS BIPOLAR (ELECTRODE) ×2 IMPLANT
COVER SURGICAL LIGHT HANDLE (MISCELLANEOUS) ×2 IMPLANT
CUFF TOURNIQUET SINGLE 18IN (TOURNIQUET CUFF) IMPLANT
CUFF TOURNIQUET SINGLE 24IN (TOURNIQUET CUFF) IMPLANT
DECANTER SPIKE VIAL GLASS SM (MISCELLANEOUS) IMPLANT
DRAPE OEC MINIVIEW 54X84 (DRAPES) IMPLANT
DRAPE SURG 17X23 STRL (DRAPES) ×2 IMPLANT
DURAPREP 26ML APPLICATOR (WOUND CARE) ×2 IMPLANT
GAUZE SPONGE 4X4 12PLY STRL (GAUZE/BANDAGES/DRESSINGS) ×1 IMPLANT
GAUZE XEROFORM 1X8 LF (GAUZE/BANDAGES/DRESSINGS) ×1 IMPLANT
GLOVE SURG SYN 8.0 (GLOVE) ×2 IMPLANT
GLOVE SURG SYN 8.0 PF PI (GLOVE) ×1 IMPLANT
GOWN STRL REUS W/ TWL LRG LVL3 (GOWN DISPOSABLE) ×1 IMPLANT
GOWN STRL REUS W/ TWL XL LVL3 (GOWN DISPOSABLE) ×1 IMPLANT
GOWN STRL REUS W/TWL LRG LVL3 (GOWN DISPOSABLE) ×2
GOWN STRL REUS W/TWL XL LVL3 (GOWN DISPOSABLE) ×2
KIT BASIN OR (CUSTOM PROCEDURE TRAY) ×2 IMPLANT
KIT ROOM TURNOVER OR (KITS) ×2 IMPLANT
NDL 18GX1X1/2 (RX/OR ONLY) (NEEDLE) IMPLANT
NDL HYPO 25GX1X1/2 BEV (NEEDLE) IMPLANT
NEEDLE 18GX1X1/2 (RX/OR ONLY) (NEEDLE) ×2 IMPLANT
NEEDLE 27GAX1X1/2 (NEEDLE) ×1 IMPLANT
NEEDLE HYPO 25GX1X1/2 BEV (NEEDLE) ×2 IMPLANT
NS IRRIG 1000ML POUR BTL (IV SOLUTION) ×2 IMPLANT
PACK ORTHO EXTREMITY (CUSTOM PROCEDURE TRAY) ×2 IMPLANT
PAD ARMBOARD 7.5X6 YLW CONV (MISCELLANEOUS) ×4 IMPLANT
PAD CAST 4YDX4 CTTN HI CHSV (CAST SUPPLIES) IMPLANT
PADDING CAST COTTON 4X4 STRL (CAST SUPPLIES)
STRIP CLOSURE SKIN 1/2X4 (GAUZE/BANDAGES/DRESSINGS) IMPLANT
SUT ETHIBOND 3-0 V-5 (SUTURE) IMPLANT
SUT ETHILON 5 0 PS 2 18 (SUTURE) IMPLANT
SUT PROLENE 3 0 PS 2 (SUTURE) IMPLANT
SUT SILK 4 0 PS 2 (SUTURE) IMPLANT
SUT VIC AB 3-0 FS2 27 (SUTURE) IMPLANT
SUT VIC AB 4-0 P-3 18X BRD (SUTURE) IMPLANT
SUT VIC AB 4-0 P3 18 (SUTURE)
SUT VICRYL RAPIDE 4/0 PS 2 (SUTURE) ×2 IMPLANT
SYR 20CC LL (SYRINGE) ×1 IMPLANT
SYR CONTROL 10ML LL (SYRINGE) ×1 IMPLANT
TOWEL OR 17X24 6PK STRL BLUE (TOWEL DISPOSABLE) ×2 IMPLANT
TOWEL OR 17X26 10 PK STRL BLUE (TOWEL DISPOSABLE) ×2 IMPLANT
TUBE CONNECTING 12X1/4 (SUCTIONS) ×1 IMPLANT
UNDERPAD 30X30 INCONTINENT (UNDERPADS AND DIAPERS) ×2 IMPLANT

## 2015-07-26 NOTE — Anesthesia Preprocedure Evaluation (Signed)
Anesthesia Evaluation  Patient identified by MRN, date of birth, ID band Patient awake    Reviewed: Allergy & Precautions, NPO status , Patient's Chart, lab work & pertinent test results  Airway Mallampati: II   Neck ROM: full    Dental   Pulmonary asthma , Current Smoker,    breath sounds clear to auscultation       Cardiovascular negative cardio ROS   Rhythm:regular Rate:Normal     Neuro/Psych  Headaches, Anxiety Depression CVA    GI/Hepatic   Endo/Other    Renal/GU      Musculoskeletal   Abdominal   Peds  Hematology   Anesthesia Other Findings   Reproductive/Obstetrics                             Anesthesia Physical Anesthesia Plan  ASA: II  Anesthesia Plan: General   Post-op Pain Management:    Induction: Intravenous  Airway Management Planned: LMA  Additional Equipment:   Intra-op Plan:   Post-operative Plan:   Informed Consent: I have reviewed the patients History and Physical, chart, labs and discussed the procedure including the risks, benefits and alternatives for the proposed anesthesia with the patient or authorized representative who has indicated his/her understanding and acceptance.     Plan Discussed with: CRNA, Anesthesiologist and Surgeon  Anesthesia Plan Comments:         Anesthesia Quick Evaluation

## 2015-07-26 NOTE — Anesthesia Procedure Notes (Signed)
Procedure Name: LMA Insertion Date/Time: 07/26/2015 9:02 PM Performed by: Manuela Schwartz B Pre-anesthesia Checklist: Patient identified, Emergency Drugs available, Suction available, Patient being monitored and Timeout performed Patient Re-evaluated:Patient Re-evaluated prior to inductionOxygen Delivery Method: Circle system utilized Preoxygenation: Pre-oxygenation with 100% oxygen Intubation Type: IV induction LMA: LMA inserted LMA Size: 4.0 Number of attempts: 1 Placement Confirmation: positive ETCO2 and breath sounds checked- equal and bilateral Tube secured with: Tape Dental Injury: Teeth and Oropharynx as per pre-operative assessment

## 2015-07-26 NOTE — ED Provider Notes (Signed)
CSN: [id number]     Arrival date & time 07/26/15  1423 History   First MD Initiated Contact with Patient 07/26/15 1425     Chief Complaint  Patient presents with  . Finger Injury      HPI Patient presents the emergency department complaining of left thumb partial amputation.  She was working under a car and thumb became stuck between the jack and the car itself.  This resulted in significant tissue destruction.  She can still flex her left thumb at her IP joint.  She is left-hand dominant.  Unsure of her tetanus status.  No other injury.  Her pain is moderate to severe in severity at this time.    Past Medical History  Diagnosis Date  . Migraine   . Kidney stone   . Interstitial cystitis   . Multiple allergies   . Endometriosis   . Hyperlipidemia   . Asthma     Exercise induced  . Cancer     Cervical  . 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 Rhinoplasty    . Cystostomy w/ bladder biopsy    . Tubal ligation    . Knee surgery    . Tonsillectomy    . Cholecystectomy    . Ablation    . Leep     Family History  Problem Relation Age of Onset  . Heart disease Mother   . COPD Mother   . Diabetes Father   . Hypertension Father   . Stroke Father   . Heart attack Father   . Breast cancer Maternal Grandmother   . Cancer Maternal Grandfather     melanoma  . Heart disease Paternal Grandmother   . Stroke Paternal Grandmother   . Heart attack Paternal Grandmother   . Heart disease Paternal Grandfather   . Stroke Paternal Grandfather   . Heart attack Paternal Grandfather    Social History  Substance Use Topics  . Smoking status: Current Every Day Smoker -- 0.50 packs/day for 25 years    Types: Cigarettes  . Smokeless tobacco: Never Used  . Alcohol Use: Yes     Comment: rarely   OB History    Gravida Para Term Preterm AB TAB SAB Ectopic Multiple Living   5 3 3  2  2   3      Review of Systems  All  other systems reviewed and are negative.     Allergies  Bee venom; Lavender oil; Singulair; Amitriptyline; Entex; Etodolac; Flexeril; Iodine; Mushroom extract complex; Robaxin; and Shellfish allergy  Home Medications   Prior to Admission medications   Medication Sig Start Date End Date Taking? Authorizing Provider  albuterol (PROVENTIL HFA;VENTOLIN HFA) 108 (90 BASE) MCG/ACT inhaler Inhale 1-2 puffs into the lungs every 6 (six) hours as needed for wheezing or shortness of breath. 07/02/15   Golden Circle, FNP  busPIRone (BUSPAR) 10 MG tablet Take 1 tablet (10 mg total) by mouth 3 (three) times daily. 06/15/15   Golden Circle, FNP  diphenhydrAMINE (BENADRYL) 25 MG tablet Take 1 tablet (25 mg total) by mouth every 6 (six) hours. Patient taking differently: Take 25-50 mg by mouth every 6 (six) hours as needed for itching or allergies.  05/13/15   Carlisle Cater, PA-C  famotidine (PEPCID) 20 MG tablet One at bedtime 07/13/15   Tanda Rockers, MD  mometasone-formoterol [facility]) 100-5 MCG/ACT AERO Take 2 puffs first thing  in am and then another 2 puffs about 12 hours later. 07/13/15   Tanda Rockers, MD  pantoprazole (PROTONIX) 40 MG tablet Take 1 tablet (40 mg total) by mouth daily. Take 30-60 min before first meal of the day 07/13/15   Tanda Rockers, MD  promethazine (PHENERGAN) 25 MG tablet Take 1 tablet (25 mg total) by mouth every 8 (eight) hours as needed for nausea or vomiting. 06/15/15   Golden Circle, FNP  rizatriptan (MAXALT) 10 MG tablet Take 0.5-1 tablets (5-10 mg total) by mouth as needed for migraine. May repeat in 2 hours if needed 06/15/15   Golden Circle, FNP  traMADol (ULTRAM) 50 MG tablet 1-2 every 4 hours as needed for cough or pain 07/13/15   Tanda Rockers, MD   BP 121/68 mmHg  Pulse 92  Temp(Src) 98 F (36.7 C) (Oral)  Resp 16  Ht 5\' 5"  (1.651 m)  Wt 175 lb (79.379 kg)  BMI 29.12 kg/m2  SpO2 98%  LMP 03/03/2007 Physical Exam  Constitutional: She is oriented to  person, place, and time. She appears well-developed and well-nourished.  HENT:  Head: Normocephalic.  Eyes: EOM are normal.  Neck: Normal range of motion.  Pulmonary/Chest: Effort normal.  Abdominal: She exhibits no distension.  Musculoskeletal: Normal range of motion.  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  There is significant tissue loss on the volar surface of the left thumb.  She is able to flex and extend at the left thumb IP joint.  There is perfusion distally.  No active bleeding.  Neurological: She is alert and oriented to person, place, and time.  Psychiatric: She has a normal mood and affect.  Nursing note and vitals reviewed.       ED Course  Procedures (including critical care time) Labs Review Labs Reviewed  CBC  BASIC METABOLIC PANEL    Imaging Review Dg Finger Thumb Left  07/26/2015   CLINICAL DATA:  Thumb smashed between car and car jack with pain  EXAM: LEFT THUMB 2+V  COMPARISON:  None.  FINDINGS: Frontal, oblique, and lateral views were obtained. There is extensive soft tissue injury distally. Multiple radiopaque foreign bodies are identified in this region. There is an avulsion along of the medial aspect of the distal portion of the first distal phalanx. No other fracture. No dislocation. Joint spaces appear intact.  IMPRESSION: Avulsion along the medial distal aspect of the first distal phalanx. Extensive soft tissue injury with multiple radiopaque foreign bodies in the distal first digit. No dislocation. No other fractures. No appreciable arthropathy.   Electronically Signed   By: Lowella Grip III M.D.   On: 07/26/2015 15:35   I have personally reviewed and evaluated these images and lab results as part of my medical decision-making.   EKG Interpretation None      MDM   Final diagnoses:  None    Spoke with Dr Burney Gauze who will close in the OR tonight. abx given. preop labs. NPO    Jola Schmidt, MD 07/26/15 1730

## 2015-07-26 NOTE — Op Note (Signed)
See note [id number]

## 2015-07-26 NOTE — Transfer of Care (Signed)
Immediate Anesthesia Transfer of Care Note  Patient: Brandi Morris  Procedure(s) Performed: Procedure(s): REPAIR WITH RECONSTRUCTION LEFT THUMB (Left)  Patient Location: PACU  Anesthesia Type:General  Level of Consciousness: awake, alert  and oriented  Airway & Oxygen Therapy: Patient Spontanous Breathing  Post-op Assessment: Report given to RN and Post -op Vital signs reviewed and stable  Post vital signs: Reviewed and stable  Last Vitals:  Filed Vitals:   07/26/15 2136  BP:   Pulse:   Temp: 36.4 C  Resp:     Complications: No apparent anesthesia complications

## 2015-07-26 NOTE — ED Notes (Signed)
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 Thumb nail is intact. Back side of left thumb is amputated with bone protruding. Bleeding is controlled.

## 2015-07-26 NOTE — Consult Note (Signed)
Reason for Consult:left thumb injury Referring Physician: Fredna Dow Brandi Morris is an 39 y.o. female.  HPI: s/p left thumb open injury with exposed bone  Past Medical History  Diagnosis Date  . Migraine   . Kidney stone   . Interstitial cystitis   . Multiple allergies   . Endometriosis   . Hyperlipidemia   . Asthma     Exercise induced  . Cancer     Cervical  . 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 Rhinoplasty    . Cystostomy w/ bladder biopsy    . Tubal ligation    . Knee surgery    . Tonsillectomy    . Cholecystectomy    . Ablation    . Leep      Family History  Problem Relation Age of Onset  . Heart disease Mother   . COPD Mother   . Diabetes Father   . Hypertension Father   . Stroke Father   . Heart attack Father   . Breast cancer Maternal Grandmother   . Cancer Maternal Grandfather     melanoma  . Heart disease Paternal Grandmother   . Stroke Paternal Grandmother   . Heart attack Paternal Grandmother   . Heart disease Paternal Grandfather   . Stroke Paternal Grandfather   . Heart attack Paternal Grandfather     Social History:  reports that she has been smoking Cigarettes.  She has a 12.5 pack-year smoking history. She has never used smokeless tobacco. She reports that she drinks alcohol. She reports that she does not use illicit drugs.  Allergies:  Allergies  Allergen Reactions  . Bee Venom Anaphylaxis  . Flexeril [Cyclobenzaprine] Other (See Comments)    Physically violent    . Lavender Oil Anaphylaxis and Other (See Comments)    Lungs and sinuses fill up with fluid  . Robaxin [Methocarbamol] Other (See Comments)    physically violent  . Amitriptyline Other (See Comments)    "sees weird things"  . Entex Other (See Comments)    dizzy  . Singulair [Montelukast Sodium] Other (See Comments)    Agitated. "My sinus cavities fill with fluid and I start choking on foam."  .  Etodolac Other (See Comments)    dizzy  . Iodine Hives  . Mushroom Extract Complex Hives  . Shellfish Allergy Hives    Medications: Scheduled:  No results found for this or any previous visit (from the past 48 hour(s)).  Dg Finger Thumb Left  07/26/2015   CLINICAL DATA:  Thumb smashed between car and car jack with pain  EXAM: LEFT THUMB 2+V  COMPARISON:  None.  FINDINGS: Frontal, oblique, and lateral views were obtained. There is extensive soft tissue injury distally. Multiple radiopaque foreign bodies are identified in this region. There is an avulsion along of the medial aspect of the distal portion of the first distal phalanx. No other fracture. No dislocation. Joint spaces appear intact.  IMPRESSION: Avulsion along the medial distal aspect of the first distal phalanx. Extensive soft tissue injury with multiple radiopaque foreign bodies in the distal first digit. No dislocation. No other fractures. No appreciable arthropathy.   Electronically Signed   By: Lowella Grip III M.D.   On: 07/26/2015 15:35    Review of Systems  All other systems reviewed and are negative.  Blood pressure 108/56, pulse 80, temperature 98 F (36.7 C), temperature source Oral,  resp. rate 16, height 5\' 5"  (1.651 m), weight 79.379 kg (175 lb), last menstrual period 03/03/2007, SpO2 98 %. Physical Exam  Constitutional: She is oriented to person, place, and time. She appears well-developed and well-nourished.  HENT:  Head: Normocephalic and atraumatic.  Cardiovascular: Normal rate.   Respiratory: Effort normal.  Musculoskeletal:       Left hand: She exhibits tenderness, disruption of two-point discrimination, deformity and laceration.  Left thumb volar soft tissue loss with exposed distal phalanx  Neurological: She is alert and oriented to person, place, and time.  Skin: Skin is warm.  Psychiatric: She has a normal mood and affect. Her behavior is normal. Judgment and thought content normal.     Assessment/Plan: As above  Plan explore and repair as needed  Roscoe A 07/26/2015, 5:10 PM

## 2015-07-26 NOTE — ED Notes (Signed)
Pt placed in gown and in bed. Pt monitored by pulse ox and bp cuff. 

## 2015-07-27 ENCOUNTER — Encounter (HOSPITAL_COMMUNITY): Payer: Self-pay | Admitting: Orthopedic Surgery

## 2015-07-27 NOTE — Op Note (Signed)
NAMEGRACEY, TOLLE              ACCOUNT NO.:  [id number]  MEDICAL RECORD NO.:  [id number]  LOCATION:  [facility]                         FACILITY:  [facility]  PHYSICIAN:  Sheral Apley. Chue Berkovich, M.D.DATE OF BIRTH:  October 09, 1976  DATE OF PROCEDURE:  07/26/2015 DATE OF DISCHARGE:  07/26/2015                              OPERATIVE REPORT   PREOPERATIVE DIAGNOSIS:  Left thumb volar soft tissue loss with exposed distal phalangeal bone.  POSTOPERATIVE DIAGNOSIS:  Left thumb volar soft tissue loss with exposed distal phalangeal bone.  PROCEDURE:  I and D above with Cutler V-Y flap advancement, primary closure.  SURGEON:  Sheral Apley. Burney Gauze, M.D.  ASSISTANT:  None.  ANESTHESIA:  General with 1% lidocaine with epinephrine 1:100,000 and 1 mL of bicarb, 10 mL lidocaine digital block.  TOURNIQUET:  None.  COMPLICATIONS:  None.  DRAINS:  None.  DESCRIPTION OF PROCEDURE:  The patient was given general laryngeal mask airway general anesthetic and I injected 10 mL of 1% lidocaine with epinephrine 1:100,000 and 1 mL bicarb around the palmar aspect of the left thumb.  We then prepped and draped in usual sterile fashion.  We did remove clotted tissue.  There was a loss of the left thumb radial pulp with exposed distal phalangeal bone.  The ulnar side was intact to the mid level of the nail plate and nail matrix.  There was again exposed distal phalangeal bone.  We carefully pulled the ulnar tip across and sewed to the nail plate with 4-0 Vicryl Rapide.  I then raised the Cutler V-Y flap on the radial border to cover the defect, this was advanced about 0.5 cm, again sutured distally with 4-0 Vicryl repeat.  The donor site was repaired with Vicryl Rapide as well.  The thumb had good contour.  All bone was covered and no exposed tendon noted otherwise.  The patient was dressed with Xeroform, 4x4s, and Coban wrap.  The patient tolerated the procedure well in a concealed fashion.    Sheral Apley  Burney Gauze, M.D.    MAW/MEDQ  D:  07/26/2015  T:  07/27/2015  Job:  [id number]

## 2015-07-27 NOTE — Anesthesia Postprocedure Evaluation (Signed)
Anesthesia Post Note  Patient: Brandi Morris  Procedure(s) Performed: Procedure(s) (LRB): REPAIR WITH RECONSTRUCTION LEFT THUMB (Left)  Anesthesia type: General  Patient location: PACU  Post pain: Pain level controlled and Adequate analgesia  Post assessment: Post-op Vital signs reviewed, Patient's Cardiovascular Status Stable, Respiratory Function Stable, Patent Airway and Pain level controlled  Last Vitals:  Filed Vitals:   07/26/15 2204  BP:   Pulse:   Temp: 36.4 C  Resp:     Post vital signs: Reviewed and stable  Level of consciousness: awake, alert  and oriented  Complications: No apparent anesthesia complications

## 2015-07-28 ENCOUNTER — Ambulatory Visit (INDEPENDENT_AMBULATORY_CARE_PROVIDER_SITE_OTHER): Payer: 59 | Admitting: Internal Medicine

## 2015-07-28 ENCOUNTER — Ambulatory Visit (INDEPENDENT_AMBULATORY_CARE_PROVIDER_SITE_OTHER)
Admission: RE | Admit: 2015-07-28 | Discharge: 2015-07-28 | Disposition: A | Discharge status: Home / Self Care | Payer: 59 | Source: Ambulatory Visit | Attending: Internal Medicine | Admitting: Internal Medicine

## 2015-07-28 ENCOUNTER — Encounter: Payer: Self-pay | Admitting: Internal Medicine

## 2015-07-28 VITALS — BP 110/68 | HR 91 | Ht 63.0 in | Wt 174.6 lb

## 2015-07-28 DIAGNOSIS — E669 Obesity, unspecified: Secondary | ICD-10-CM | POA: Diagnosis not present

## 2015-07-28 DIAGNOSIS — F17210 Nicotine dependence, cigarettes, uncomplicated: Secondary | ICD-10-CM

## 2015-07-28 DIAGNOSIS — R05 Cough: Secondary | ICD-10-CM | POA: Diagnosis not present

## 2015-07-28 DIAGNOSIS — R058 Other specified cough: Secondary | ICD-10-CM

## 2015-07-28 DIAGNOSIS — Z720 Tobacco use: Secondary | ICD-10-CM | POA: Diagnosis not present

## 2015-07-28 DIAGNOSIS — J45991 Cough variant asthma: Secondary | ICD-10-CM | POA: Diagnosis not present

## 2015-07-28 NOTE — Progress Notes (Signed)
Quick Note:  Spoke with pt and notified of results per Dr. Wert. Pt verbalized understanding and denied any questions.  ______ 

## 2015-07-28 NOTE — Assessment & Plan Note (Signed)
07/11/15  PFT's no airflow obst including FEF 25-75  07/13/2015 hfa 75% p coaching>> rec  Try off breo and on dulera 100 2bid 07/13/2015> no better so try off 07/28/2015

## 2015-07-28 NOTE — Assessment & Plan Note (Signed)
Classic irritable larynx pattern cough : exquisitely sensitive to perfumes. No noct symptoms.   Need to eval for sinus dz with sinus CT and try 1st gen h1 per guidelines but main effort needs to be elimination of cyclical coughing  I had an extended discussion with the patient reviewing all relevant studies completed to date and  lasting 15 to 20 minutes of a 25 minute visit    Each maintenance medication was reviewed in detail including most importantly the difference between maintenance and prns and under what circumstances the prns are to be triggered using an action plan format that is not reflected in the computer generated alphabetically organized AVS.    Please see instructions for details which were reviewed in writing and the patient given a copy highlighting the part that I personally wrote and discussed at today's ov.

## 2015-07-28 NOTE — Assessment & Plan Note (Signed)

## 2015-07-28 NOTE — Assessment & Plan Note (Signed)
pfts 07/11/15 ERV 50% c/w body habitus  Body mass index is 30.94 kg/(m^2).  Lab Results  Component Value Date   TSH 0.62 10/17/2006     Contributing to gerd tendency/ doe/reviewed need  achieve and maintain neg calorie balance > defer f/u primary care including intermittently monitoring thyroid status

## 2015-07-28 NOTE — Progress Notes (Signed)
Subjective:     Patient ID: Brandi Morris, female   DOB: July 02, 1976    MRN: [id number]    Brief patient profile:  39 yowf active smoker with paroxysms of cough to point of can't breath x 2012 assoc with severe rhinitis/sense of pnds   Self referred to pulmonary clinic 07/13/2015    History of Present Illness  07/13/2015 1st [facility] office visit/ Randal Yepiz  Re uacs/irritant provoked  vs cough variant asthma Chief Complaint  Patient presents with  . Pulmonary Consult    Self referral. Pt states dxed with Asthma as a child but never had an issue with her breathing until approx 4 yrs ago.  She gets out of breath when exposed to strong smells and also when she walks up stairs.  She also c/o cough- prod with clear sputum.    acute symptoms abruptly started 4 y prior to OV  p exp to perfume at funeral home > admit to [facility] and gradually worse since then with fits of daily coughing worse on BREO and using saba q 6-8h  s relief.  Also sob with activity with subjective wheezing with activity  rec Stop [facility] dulera 100 Take 2 puffs first thing in am and then another 2 puffs about 12 hours later.  Only use your albuterol as a rescue medication Take delsym two tsp every 12 hours and supplement if needed with  tramadol 50 mg up to 1 every 4 hours Once you have eliminated the cough for 3 straight days try reducing the tramadol first,  then the delsym as tolerated.   GERD diet  Pantoprazole (protonix) 40 mg   Take  30-60 min before first meal of the day and Pepcid (famotidine)  20 mg one @  bedtime until return to office - this is the best way to tell whether stomach acid is contributing to your problem.      07/28/2015 f/u ov/Sheddrick Lattanzio re: cough x 2011 day >>> noct / worse with  Chief Complaint  Patient presents with  . Follow-up    cough is no better.  Still a lot of mucus drainage down throat.  flu shot   still smoking but cutting down  No excess mucus. Some nasal congestion. Neg allergy eval  per [facility]. Cough went from bad to worse p ET for thumb surgery 07/26/15    No obvious day to day or daytime variability or assoc sob unless coughing  or cp or chest tightness,  Or overt  hb symptoms. No unusual exp hx or h/o childhood pna/ asthma or knowledge of premature birth.  Sleeping ok without nocturnal  or early am exacerbation  of respiratory  c/o's or need for noct saba. Also denies any obvious fluctuation of symptoms with weather or environmental changes or other aggravating or alleviating factors except as outlined above   Current Medications, Allergies, Complete Past Medical History, Past Surgical History, Family History, and Social History were reviewed in [employer] record.  ROS  The following are not active complaints unless bolded sore throat, dysphagia, dental problems, itching, sneezing,  nasal congestion or excess/ purulent secretions, ear ache,   fever, chills, sweats, unintended wt loss, classically pleuritic or exertional cp, hemoptysis,  orthopnea pnd or leg swelling, presyncope, palpitations, abdominal pain, anorexia, nausea, vomiting, diarrhea  or change in bowel or bladder habits, change in stools or urine, dysuria,hematuria,  rash, arthralgias, visual complaints, headache, numbness, weakness or ataxia or problems with walking or coordination,  change  in mood/affect or memory.               Objective:   Physical Exam  amb wf nad harsh throat clearing/ no candy handy   07/28/2015          175  Wt Readings from Last 3 Encounters:  07/13/15 176 lb 3.2 oz (79.924 kg)  07/04/15 172 lb (78.019 kg)  06/28/15 173 lb (78.472 kg)    Vital signs reviewed   HEENT: nl dentition, turbinates, and orophanx. Nl external ear canals without cough reflex   NECK :  without JVD/Nodes/TM/ nl carotid upstrokes bilaterally   LUNGS: no acc muscle use, clear to A and P bilaterally without cough on insp or exp maneuvers   CV:  RRR  no s3 or murmur or  increase in P2, no edema   ABD:  soft and nontender with nl excursion in the supine position. No bruits or organomegaly, bowel sounds nl  MS:  warm without deformities, calf tenderness, cyanosis or clubbing  SKIN: warm and dry without lesions    NEURO:  alert, approp, no deficits        CXR PA and Lateral:   07/28/2015 :     I personally reviewed images and agree with radiology impression as follows:    No active cardiopulmonary disease     Assessment:

## 2015-07-28 NOTE — Patient Instructions (Addendum)
Take delsym two tsp every 12 hours and supplement if needed with  tramadol 50 mg up to 2 every 4 hours to suppress the urge to cough. Swallowing water or using ice chips/non mint and menthol containing candies (such as lifesavers or sugarless jolly ranchers) are also effective.  You should rest your voice and avoid activities that you know make you cough.  Once you have eliminated the cough for 3 straight days try reducing the tramadol first,  then the delsym as tolerated.    Prednisone 10 mg take  4 each am x 2 days,   2 each am x 2 days,  1 each am x 2 days and stop  Stop benadryl and For drainage take chlortrimeton (chlorpheniramine) 4 mg every 4 hours available over the counter (may cause drowsiness)    Please see patient coordinator before you leave today  to schedule sinus ct  dulera try off it and work hard on no smoking   Please remember to go to the  x-ray department downstairs for your tests - we will call you with the results when they are available.

## 2015-08-04 ENCOUNTER — Inpatient Hospital Stay: Admission: RE | Admit: 2015-08-04 | Payer: 59 | Source: Ambulatory Visit

## 2015-08-04 ENCOUNTER — Telehealth: Payer: Self-pay | Admitting: Internal Medicine

## 2015-08-04 DIAGNOSIS — R058 Other specified cough: Secondary | ICD-10-CM

## 2015-08-04 DIAGNOSIS — R05 Cough: Secondary | ICD-10-CM

## 2015-08-04 NOTE — Telephone Encounter (Signed)
Pt was scheduled for CT sinus and NS. FYI to MW

## 2015-08-21 ENCOUNTER — Telehealth: Payer: Self-pay | Admitting: Internal Medicine

## 2015-08-22 ENCOUNTER — Encounter (HOSPITAL_COMMUNITY): Admission: RE | Payer: Self-pay | Source: Ambulatory Visit

## 2015-08-22 ENCOUNTER — Ambulatory Visit (HOSPITAL_COMMUNITY): Admission: RE | Admit: 2015-08-22 | Payer: 59 | Source: Ambulatory Visit | Admitting: Obstetrics & Gynecology

## 2015-08-22 SURGERY — HYSTERECTOMY, VAGINAL, LAPAROSCOPY-ASSISTED
Anesthesia: Choice | Site: Vagina

## 2015-08-22 NOTE — Telephone Encounter (Signed)
lmtcb X1 for Ann.

## 2015-08-23 NOTE — Telephone Encounter (Signed)
Called Ann and received VM--LMTCB

## 2015-08-24 NOTE — Telephone Encounter (Signed)
lmtcb X3 for Ann.  Will close message per triage protocol.

## 2015-08-28 ENCOUNTER — Telehealth: Payer: Self-pay | Admitting: Internal Medicine

## 2015-08-28 NOTE — Telephone Encounter (Signed)
Left message to call back  

## 2015-08-30 NOTE — Telephone Encounter (Signed)
Lm for Ann to call back

## 2015-08-30 NOTE — Telephone Encounter (Signed)
Ann from Disability dept calling requesting notes to support patient's claim for disability.   Do we have any information stating patient needs to be on disability? Lelon Frohlich says that if we do not send anything today, that she will close out the claim.   Dr. Melvyn Novas, please advise.

## 2015-08-30 NOTE — Telephone Encounter (Signed)
Called and spoke with Lelon Frohlich from [place] her that MW had did not seen any documentation that he recommended her for disability Lelon Frohlich voiced understanding and had no further questions Nothing further needed, will sign off on the message

## 2015-08-30 NOTE — Telephone Encounter (Signed)
i don't see any documentation where I recommended disability for her

## 2015-08-30 NOTE — Telephone Encounter (Signed)
Samul Dada, she states she spoke to medical records, and patient has not signed release, wanting to know if we have any information that we can send her that can support the patients request for disability, if not she is denying the disability request, [phone] ext [phone]

## 2015-11-10 ENCOUNTER — Ambulatory Visit (INDEPENDENT_AMBULATORY_CARE_PROVIDER_SITE_OTHER): Payer: 59 | Admitting: Primary Care

## 2015-11-10 ENCOUNTER — Ambulatory Visit (INDEPENDENT_AMBULATORY_CARE_PROVIDER_SITE_OTHER)
Admission: RE | Admit: 2015-11-10 | Discharge: 2015-11-10 | Disposition: A | Discharge status: Home / Self Care | Payer: 59 | Source: Ambulatory Visit | Attending: Primary Care | Admitting: Primary Care

## 2015-11-10 ENCOUNTER — Telehealth: Payer: Self-pay | Admitting: Family

## 2015-11-10 ENCOUNTER — Encounter: Payer: Self-pay | Admitting: Primary Care

## 2015-11-10 VITALS — BP 126/74 | Ht 63.0 in | Wt 170.0 lb

## 2015-11-10 DIAGNOSIS — M5442 Lumbago with sciatica, left side: Secondary | ICD-10-CM | POA: Diagnosis not present

## 2015-11-10 MED ORDER — PREDNISONE 20 MG PO TABS: ORAL_TABLET | ORAL | Status: DC

## 2015-11-10 MED ORDER — METHYLPREDNISOLONE ACETATE 80 MG/ML IJ SUSP
80.0000 mg | Freq: Once | INTRAMUSCULAR | Status: AC
  Administered 2015-11-10: 80 mg via INTRAMUSCULAR

## 2015-11-10 NOTE — Progress Notes (Signed)
Subjective:    Patient ID: Brandi Morris, female    DOB: 1976-02-17, 39 y.o.   MRN: [id number]  HPI  Brandi Morris is a 39 year old female who presents today with a chief complaint of back pain. Her pain is located to her lower back on the left side and left hip with radiation of pain to her lower extremity.   She's noticed a "knot" to her back for the past month and has had mild back to that location. The pain has improved daily with aleve and application of heat. 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   Last Wednesday she was raking the leaves in her yard. Later that day she noticed moderate to severe pain to her left lower back, hip, and lower extremity. She also reports numbness/tingling to her lower extremity intermittently.  Her pain is worse with weight bearing activities. She has been taking Aleve with temporary improvement. She's been unable to go to work due to her pain. Denies prior history of back injuries. Denies recent injury. She cannot tolerate muscle relaxants.  Review of Systems  Constitutional: Negative for fever.  Genitourinary: Negative for difficulty urinating.       No change in bowel or bladder  Musculoskeletal: Positive for arthralgias.  Neurological: Positive for numbness.       Past Medical History  Diagnosis Date  . Migraine   . Kidney stone   . Interstitial cystitis   . Multiple allergies   . Endometriosis   . Hyperlipidemia   . Asthma     Exercise induced  . Cancer (HCC)     Cervical  . 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 Marital Status: Single    Spouse Name: N/A  . Number of Children: 3  . Years of Education: 15   Occupational History  . Billing Specialist    Social History Main Topics  . Smoking status: Current Every Day Smoker -- 0.50 packs/day for 25 years    Types: Cigarettes  . Smokeless tobacco: Never Used  . Alcohol Use: Yes     Comment: rarely    . Drug Use: No  . Sexual Activity: Yes    Birth Control/ Protection: Surgical   Other Topics Concern  . Not on file   Social History Narrative   Fun: Fish and hunt   Denies abuse and feels safe at home.     Past Surgical History  Procedure Laterality Date  . Rhinoplasty    . Cystostomy w/ bladder biopsy    . Tubal ligation    . Knee surgery    . Tonsillectomy    . Cholecystectomy    . Ablation    . Leep    . Tendon repair Left 07/26/2015    Procedure: REPAIR WITH RECONSTRUCTION LEFT THUMB;  Surgeon: Charlotte Crumb, MD;  Location: [facility];  Service: Orthopedics;  Laterality: Left;    Family History  Problem Relation Age of Onset  . Heart disease Mother   . COPD Mother   . Diabetes Father   . Hypertension Father   . Stroke Father   . Heart attack Father   . Breast cancer Maternal Grandmother   . Cancer Maternal Grandfather     melanoma  . Heart disease Paternal Grandmother   . Stroke Paternal Grandmother   . Heart attack Paternal Grandmother   . Heart disease Paternal  Grandfather   . Stroke Paternal Grandfather   . Heart attack Paternal Grandfather     Allergies  Allergen Reactions  . Bee Venom Anaphylaxis  . Flexeril [Cyclobenzaprine] Other (See Comments)    Physically violent    . Lavender Oil Anaphylaxis and Other (See Comments)    Lungs and sinuses fill up with fluid  . Robaxin [Methocarbamol] Other (See Comments)    physically violent  . Amitriptyline Other (See Comments)    "sees weird things"  . Entex Other (See Comments)    dizzy  . Singulair [Montelukast Sodium] Other (See Comments)    Agitated. "My sinus cavities fill with fluid and I start choking on foam."  . Etodolac Other (See Comments)    dizzy  . Iodine Hives  . Mushroom Extract Complex Hives  . Shellfish Allergy Hives    Current Outpatient Prescriptions on File Prior to Visit  Medication Sig Dispense Refill  . albuterol (PROVENTIL HFA;VENTOLIN HFA) 108 (90 BASE) MCG/ACT inhaler  Inhale 1-2 puffs into the lungs every 6 (six) hours as needed for wheezing or shortness of breath. 18 g 2  . busPIRone (BUSPAR) 10 MG tablet Take 1 tablet (10 mg total) by mouth 3 (three) times daily. (Patient taking differently: Take 10 mg by mouth as needed. ) 60 tablet 1  . rizatriptan (MAXALT) 10 MG tablet Take 0.5-1 tablets (5-10 mg total) by mouth as needed for migraine. May repeat in 2 hours if needed 9 tablet 0  . [DISCONTINUED] clonazePAM (KLONOPIN) 0.5 MG tablet Take 1 tablet (0.5 mg total) by mouth 2 (two) times daily. (Patient not taking: Reported on 04/20/2015) 4 tablet 0   No current facility-administered medications on file prior to visit.    BP 126/74 mmHg  Ht 5\' 3"  (1.6 m)  Wt 170 lb (77.111 kg)  BMI 30.12 kg/m2    Objective:   Physical Exam  Constitutional: She appears well-nourished.  Cardiovascular: Normal rate and regular rhythm.   Pulmonary/Chest: Effort normal and breath sounds normal.  Musculoskeletal:       Lumbar back: She exhibits decreased range of motion, tenderness and pain.  Moderate to severe pain to left lower back just lateral to lumbar spine. + straight leg raise to left lower extremity. Moderate reduction in ROM.  Skin: Skin is warm and dry.          Assessment & Plan:  Back Pain:  Increased pain to left lower back since Wednesday this week after raking leaves. Pain to left lower back pain x 1 month, mild overall. Moderate reduction in ROM to left lower extremity and back as she experiences pain. + straight leg raise to left side. Tender upon palpation.  Feels to be a muscle spasm, however, cannot tolerate muscle relaxants.  Due to radiculopathy will obtain lumbar xray and prescribe prednisone. Hold aleve during course of prednisone, supplement tylenol PRN. IM Depo Medrol 80 provided in office today. Continue heat and massage. She is to follow up with PCP if no improvement.

## 2015-11-10 NOTE — Telephone Encounter (Signed)
Patient Name: Brandi Morris  DOB: [birth year]    Initial Comment Caller States: back pain trouble walking and breathing. I cant even moving   Nurse Assessment  Nurse: Raphael Gibney, RN, Vanita Ingles Date/Time (Eastern Time): 11/10/2015 11:01:09 AM  Confirm and document reason for call. If symptomatic, describe symptoms. ---Caller states she is having back pain in her lower left back. Has a knot about 2 times the size of golf ball. Raised area is 4 inches wide and 2 inches long. Tender to the touch. Pain is radiating down her left leg to her foot. She has taken aleve and has used ice and heat. Pain level 6-7. Has had knot for over a month and has had pain for 2 weeks.  Has the patient traveled out of the country within the last 30 days? ---Not Applicable  Does the patient have any new or worsening symptoms? ---Yes  Will a triage be completed? ---Yes  Related visit to physician within the last 2 weeks? ---No  Does the PT have any chronic conditions? (i.e. diabetes, asthma, etc.) ---Yes  List chronic conditions. ---kidney stones  Did the patient indicate they were pregnant? ---No  Is this a behavioral health or substance abuse call? ---No     Guidelines    Guideline Title Affirmed Question Affirmed Notes  Back Pain Weakness of a leg or foot (e.g., unable to bear weight, dragging foot)    Final Disposition User   Go to ED Now (or PCP triage) Raphael Gibney, RN, [place]    Comments  office is open, appt scheduled vs sending pt to the ER Appt scheduled with Alma Friendly at [facility] 2:45 pm 11/10/2015.   Referrals  REFERRED TO PCP OFFICE   Disagree/Comply: Comply

## 2015-11-10 NOTE — Patient Instructions (Signed)
Start Prednisone tablets. Take 3 tablets daily for 2 days, then 2 tablets for 2 days, then 1 tablet for 2 days.  Complete xray(s) prior to leaving today. I will notify you of your results once received.  Continue to apply heat and massage.  Follow up with PCP if pain continues or no improvement.   It was a pleasure meeting you!  Back Pain, Adult Back pain is very common in adults.The cause of back pain is rarely dangerous and the pain often gets better over time.The cause of your back pain may not be known. Some common causes of back pain include:  Strain of the muscles or ligaments supporting the spine.  Wear and tear (degeneration) of the spinal disks.  Arthritis.  Direct injury to the back. For many people, back pain may return. Since back pain is rarely dangerous, most people can learn to manage this condition on their own. HOME CARE INSTRUCTIONS Watch your back pain for any changes. The following actions may help to lessen any discomfort you are feeling:  Remain active. It is stressful on your back to sit or stand in one place for long periods of time. Do not sit, drive, or stand in one place for more than 30 minutes at a time. Take short walks on even surfaces as soon as you are able.Try to increase the length of time you walk each day.  Exercise regularly as directed by your health care provider. Exercise helps your back heal faster. It also helps avoid future injury by keeping your muscles strong and flexible.  Do not stay in bed.Resting more than 1-2 days can delay your recovery.  Pay attention to your body when you bend and lift. The most comfortable positions are those that put less stress on your recovering back. Always use proper lifting techniques, including:  Bending your knees.  Keeping the load close to your body.  Avoiding twisting.  Find a comfortable position to sleep. Use a firm mattress and lie on your side with your knees slightly bent. If you lie on  your back, put a pillow under your knees.  Avoid feeling anxious or stressed.Stress increases muscle tension and can worsen back pain.It is important to recognize when you are anxious or stressed and learn ways to manage it, such as with exercise.  Take medicines only as directed by your health care provider. Over-the-counter medicines to reduce pain and inflammation are often the most helpful.Your health care provider may prescribe muscle relaxant drugs.These medicines help dull your pain so you can more quickly return to your normal activities and healthy exercise.  Apply ice to the injured area:  Put ice in a plastic bag.  Place a towel between your skin and the bag.  Leave the ice on for 20 minutes, 2-3 times a day for the first 2-3 days. After that, ice and heat may be alternated to reduce pain and spasms.  Maintain a healthy weight. Excess weight puts extra stress on your back and makes it difficult to maintain good posture. SEEK MEDICAL CARE IF:  You have pain that is not relieved with rest or medicine.  You have increasing pain going down into the legs or buttocks.  You have pain that does not improve in one week.  You have night pain.  You lose weight.  You have a fever or chills. SEEK IMMEDIATE MEDICAL CARE IF:   You develop new bowel or bladder control problems.  You have unusual weakness or numbness in your arms  or legs.  You develop nausea or vomiting.  You develop abdominal pain.  You feel faint.   This information is not intended to replace advice given to you by your health care provider. Make sure you discuss any questions you have with your health care provider.   Document Released: 11/04/2005 Document Revised: 11/25/2014 Document Reviewed: 03/08/2014 Elsevier Interactive Patient Education [employer].

## 2015-11-10 NOTE — Addendum Note (Signed)
Addended by: Jacqualin Combes on: 11/10/2015 03:54 PM   Modules accepted: Orders

## 2015-12-01 ENCOUNTER — Emergency Department (HOSPITAL_COMMUNITY)
Admission: EM | Admit: 2015-12-01 | Discharge: 2015-12-01 | Disposition: A | Discharge status: Home / Self Care | Payer: 59 | Attending: Emergency Medicine | Admitting: Emergency Medicine

## 2015-12-01 ENCOUNTER — Encounter (HOSPITAL_COMMUNITY): Payer: Self-pay

## 2015-12-01 ENCOUNTER — Emergency Department (HOSPITAL_COMMUNITY): Payer: 59

## 2015-12-01 DIAGNOSIS — F17210 Nicotine dependence, cigarettes, uncomplicated: Secondary | ICD-10-CM | POA: Insufficient documentation

## 2015-12-01 DIAGNOSIS — L03317 Cellulitis of buttock: Secondary | ICD-10-CM | POA: Insufficient documentation

## 2015-12-01 DIAGNOSIS — Z79899 Other long term (current) drug therapy: Secondary | ICD-10-CM | POA: Insufficient documentation

## 2015-12-01 DIAGNOSIS — Z8541 Personal history of malignant neoplasm of cervix uteri: Secondary | ICD-10-CM | POA: Insufficient documentation

## 2015-12-01 DIAGNOSIS — J45909 Unspecified asthma, uncomplicated: Secondary | ICD-10-CM | POA: Insufficient documentation

## 2015-12-01 DIAGNOSIS — Z8673 Personal history of transient ischemic attack (TIA), and cerebral infarction without residual deficits: Secondary | ICD-10-CM | POA: Insufficient documentation

## 2015-12-01 DIAGNOSIS — Z8742 Personal history of other diseases of the female genital tract: Secondary | ICD-10-CM | POA: Insufficient documentation

## 2015-12-01 DIAGNOSIS — Z8679 Personal history of other diseases of the circulatory system: Secondary | ICD-10-CM | POA: Insufficient documentation

## 2015-12-01 DIAGNOSIS — Z8639 Personal history of other endocrine, nutritional and metabolic disease: Secondary | ICD-10-CM | POA: Insufficient documentation

## 2015-12-01 MED ORDER — CLINDAMYCIN HCL 150 MG PO CAPS: 450.0000 mg | ORAL_CAPSULE | Freq: Four times a day (QID) | ORAL | Status: DC

## 2015-12-01 MED ORDER — LIDOCAINE-EPINEPHRINE (PF) 2 %-1:200000 IJ SOLN
20.0000 mL | Freq: Once | INTRAMUSCULAR | Status: AC
  Administered 2015-12-01: 20 mL
  Filled 2015-12-01: qty 20

## 2015-12-01 NOTE — ED Notes (Signed)
Patient states that she has had an abscess x2 that has worsened on the right buttock area.

## 2015-12-01 NOTE — ED Provider Notes (Signed)
CSN: [id number]     Arrival date & time 12/01/15  1053 History   First MD Initiated Contact with Patient 12/01/15 1407     Chief Complaint  Patient presents with  . Abscess     (Consider location/radiation/quality/duration/timing/severity/associated sxs/prior Treatment) HPI  40 year old female presents with pain, redness, and swelling to her right buttocks. 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 She has had pain since. Over the last few days has noticed the redness and swelling. She thinks the part that hit her was metallic may somewhat she was working on. Denies any fevers. No prior history of abscesses.  Past Medical History  Diagnosis Date  . Migraine   . Kidney stone   . Interstitial cystitis   . Multiple allergies   . Endometriosis   . Hyperlipidemia   . Asthma     Exercise induced  . Cancer (HCC)     Cervical  . 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 Uterine cancer [facility])    Past Surgical History  Procedure Laterality Date  . Rhinoplasty    . Cystostomy w/ bladder biopsy    . Tubal ligation    . Knee surgery    . Tonsillectomy    . Cholecystectomy    . Ablation    . Leep    . Tendon repair Left 07/26/2015    Procedure: REPAIR WITH RECONSTRUCTION LEFT THUMB;  Surgeon: Charlotte Crumb, MD;  Location: [place];  Service: Orthopedics;  Laterality: Left;  . Colposcopy     Family History  Problem Relation Age of Onset  . Heart disease Mother   . COPD Mother   . Diabetes Father   . Hypertension Father   . Stroke Father   . Heart attack Father   . Breast cancer Maternal Grandmother   . Cancer Maternal Grandfather     melanoma  . Heart disease Paternal Grandmother   . Stroke Paternal Grandmother   . Heart attack Paternal Grandmother   . Heart disease Paternal Grandfather   . Stroke Paternal Grandfather   . Heart attack Paternal Grandfather    Social History    Substance Use Topics  . Smoking status: Current Every Day Smoker -- 0.50 packs/day for 25 years    Types: Cigarettes  . Smokeless tobacco: Never Used  . Alcohol Use: Yes     Comment: rarely   OB History    Gravida Para Term Preterm AB TAB SAB Ectopic Multiple Living   5 3 3  2  2   3      Review of Systems  Constitutional: Negative for fever.  Gastrointestinal: Negative for vomiting.  Musculoskeletal: Positive for myalgias.  Skin: Positive for color change and wound.  All other systems reviewed and are negative.     Allergies  Bee venom; Flexeril; Lavender oil; Robaxin; Amitriptyline; Entex; Singulair; Etodolac; Iodine; Mushroom extract complex; and Shellfish allergy  Home Medications   Prior to Admission medications   Medication Sig Start Date End Date Taking? Authorizing Provider  albuterol (PROVENTIL HFA;VENTOLIN HFA) 108 (90 BASE) MCG/ACT inhaler Inhale 1-2 puffs into the lungs every 6 (six) hours as needed for wheezing or shortness of breath. 07/02/15  Yes Golden Circle, FNP  busPIRone (BUSPAR) 10 MG tablet Take 1 tablet (10 mg total) by mouth 3 (three) times daily. Patient taking differently: Take 10 mg by mouth 3 (three)  times daily as needed (anxiety).  06/15/15  Yes Golden Circle, FNP  EPINEPHrine (EPIPEN 2-PAK) 0.3 mg/0.3 mL IJ SOAJ injection Inject 0.3 mg into the muscle once as needed (allergic reaction).   Yes Historical Provider, MD  predniSONE (DELTASONE) 20 MG tablet Take 3 tablets by mouth daily for 2 days, then 2 tablets for 2 days, then 1 tablet for 2 days. Patient not taking: Reported on 12/01/2015 11/10/15   Pleas Koch, NP  rizatriptan (MAXALT) 10 MG tablet Take 0.5-1 tablets (5-10 mg total) by mouth as needed for migraine. May repeat in 2 hours if needed Patient not taking: Reported on 12/01/2015 06/15/15   Golden Circle, FNP   BP 133/78 mmHg  Pulse 101  Temp(Src) 97.9 F (36.6 C) (Oral)  Resp 16  Ht 5\' 2"  (1.575 m)  Wt 170 lb (77.111 kg)   BMI 31.09 kg/m2  SpO2 99% Physical Exam  Constitutional: She is oriented to person, place, and time. She appears well-developed and well-nourished.  HENT:  Head: Normocephalic and atraumatic.  Right Ear: External ear normal.  Left Ear: External ear normal.  Nose: Nose normal.  Eyes: Right eye exhibits no discharge. Left eye exhibits no discharge.  Pulmonary/Chest: Effort normal.  Abdominal: She exhibits no distension.  Musculoskeletal:       Legs: Neurological: She is alert and oriented to person, place, and time.  Skin: Skin is warm and dry. There is erythema.  Nursing note and vitals reviewed.   ED Course  .Marland KitchenIncision and Drainage Date/Time: 12/01/2015 4:28 PM Performed by: Sherwood Gambler Authorized by: Sherwood Gambler Consent: Verbal consent obtained. Risks and benefits: risks, benefits and alternatives were discussed Consent given by: patient Type: abscess Body area: lower extremity Location details: right buttock Anesthesia: local infiltration Local anesthetic: lidocaine 2% with epinephrine Patient sedated: no Scalpel size: 11 Incision type: elliptical Complexity: simple Drainage: purulent Drainage amount: scant Wound treatment: wound left open Patient tolerance: Patient tolerated the procedure well with no immediate complications   (including critical care time) Labs Review Labs Reviewed - No data to display  Imaging Review Dg Pelvis 1-2 Views  12/01/2015  CLINICAL DATA:  Right buttock injury at work 1 week ago. EXAM: PELVIS - 1-2 VIEW COMPARISON:  None. FINDINGS: Both hips are normally located. No hip fracture or AVN. The pubic symphysis and SI joints are intact. No pelvic fractures. No bone lesions. No radiopaque foreign body is identified. IMPRESSION: No acute bony findings or radiopaque foreign body. Electronically Signed   By: Marijo Sanes M.D.   On: 12/01/2015 14:53   I have personally reviewed and evaluated these images and lab results as part of my  medical decision-making.   EKG Interpretation None      MDM   Final diagnoses:  Cellulitis of right buttock    Patient with sialitis with mild swelling in the middle that appears like an abscess. Ultrasound series a small fluid collection. This was incised with very little drainage. Most likely this is mostly cellulitic and will treat with clindamycin. No metallic foreign body seen on x-ray. I highly doubt a deeper soft tissue infection. Unlikely to be a different foreign body such as wood given no obvious puncture wound. Discussed return per cautions, recommend follow up with PCP for wound check.    Sherwood Gambler, MD 12/01/15 [phone]

## 2015-12-01 NOTE — ED Notes (Signed)
Bulky dressing applied to open abscess.

## 2016-01-05 ENCOUNTER — Other Ambulatory Visit: Payer: Self-pay | Admitting: Family

## 2016-01-15 ENCOUNTER — Emergency Department (HOSPITAL_COMMUNITY)
Admission: EM | Admit: 2016-01-15 | Discharge: 2016-01-15 | Disposition: A | Discharge status: Home / Self Care | Payer: 59 | Attending: Emergency Medicine | Admitting: Emergency Medicine

## 2016-01-15 ENCOUNTER — Encounter (HOSPITAL_COMMUNITY): Payer: Self-pay | Admitting: Emergency Medicine

## 2016-01-15 ENCOUNTER — Emergency Department (HOSPITAL_COMMUNITY): Payer: 59

## 2016-01-15 DIAGNOSIS — S39012A Strain of muscle, fascia and tendon of lower back, initial encounter: Secondary | ICD-10-CM | POA: Insufficient documentation

## 2016-01-15 DIAGNOSIS — F17210 Nicotine dependence, cigarettes, uncomplicated: Secondary | ICD-10-CM | POA: Insufficient documentation

## 2016-01-15 DIAGNOSIS — Y998 Other external cause status: Secondary | ICD-10-CM | POA: Insufficient documentation

## 2016-01-15 DIAGNOSIS — Z87448 Personal history of other diseases of urinary system: Secondary | ICD-10-CM | POA: Insufficient documentation

## 2016-01-15 DIAGNOSIS — Y9289 Other specified places as the place of occurrence of the external cause: Secondary | ICD-10-CM | POA: Insufficient documentation

## 2016-01-15 DIAGNOSIS — X58XXXA Exposure to other specified factors, initial encounter: Secondary | ICD-10-CM | POA: Insufficient documentation

## 2016-01-15 DIAGNOSIS — J45909 Unspecified asthma, uncomplicated: Secondary | ICD-10-CM | POA: Insufficient documentation

## 2016-01-15 DIAGNOSIS — Z79899 Other long term (current) drug therapy: Secondary | ICD-10-CM | POA: Insufficient documentation

## 2016-01-15 DIAGNOSIS — M5442 Lumbago with sciatica, left side: Secondary | ICD-10-CM | POA: Insufficient documentation

## 2016-01-15 DIAGNOSIS — Z87442 Personal history of urinary calculi: Secondary | ICD-10-CM | POA: Insufficient documentation

## 2016-01-15 DIAGNOSIS — Z8673 Personal history of transient ischemic attack (TIA), and cerebral infarction without residual deficits: Secondary | ICD-10-CM | POA: Insufficient documentation

## 2016-01-15 DIAGNOSIS — Z8679 Personal history of other diseases of the circulatory system: Secondary | ICD-10-CM | POA: Insufficient documentation

## 2016-01-15 DIAGNOSIS — Z8639 Personal history of other endocrine, nutritional and metabolic disease: Secondary | ICD-10-CM | POA: Insufficient documentation

## 2016-01-15 DIAGNOSIS — Z8742 Personal history of other diseases of the female genital tract: Secondary | ICD-10-CM | POA: Insufficient documentation

## 2016-01-15 DIAGNOSIS — Y9389 Activity, other specified: Secondary | ICD-10-CM | POA: Insufficient documentation

## 2016-01-15 MED ORDER — KETOROLAC TROMETHAMINE 30 MG/ML IJ SOLN
30.0000 mg | Freq: Once | INTRAMUSCULAR | Status: AC
  Administered 2016-01-15: 30 mg via INTRAVENOUS
  Filled 2016-01-15: qty 1

## 2016-01-15 MED ORDER — OXYCODONE-ACETAMINOPHEN 5-325 MG PO TABS: 1.0000 | ORAL_TABLET | Freq: Four times a day (QID) | ORAL | Status: DC | PRN

## 2016-01-15 MED ORDER — KETOROLAC TROMETHAMINE 30 MG/ML IJ SOLN: 30.0000 mg | Freq: Once | INTRAMUSCULAR | Status: DC

## 2016-01-15 MED ORDER — NAPROXEN 500 MG PO TABS: 500.0000 mg | ORAL_TABLET | Freq: Two times a day (BID) | ORAL | Status: DC

## 2016-01-15 MED ORDER — FENTANYL CITRATE (PF) 100 MCG/2ML IJ SOLN
50.0000 ug | Freq: Once | INTRAMUSCULAR | Status: AC
  Administered 2016-01-15: 50 ug via INTRAVENOUS
  Filled 2016-01-15: qty 2

## 2016-01-15 MED ORDER — DIAZEPAM 5 MG/ML IJ SOLN
5.0000 mg | Freq: Once | INTRAMUSCULAR | Status: AC
  Administered 2016-01-15: 5 mg via INTRAVENOUS
  Filled 2016-01-15: qty 2

## 2016-01-15 MED ORDER — DIAZEPAM 5 MG PO TABS: 5.0000 mg | ORAL_TABLET | Freq: Two times a day (BID) | ORAL | Status: DC

## 2016-01-15 MED ORDER — DEXAMETHASONE SODIUM PHOSPHATE 10 MG/ML IJ SOLN
10.0000 mg | Freq: Once | INTRAMUSCULAR | Status: AC
  Administered 2016-01-15: 10 mg via INTRAVENOUS
  Filled 2016-01-15: qty 1

## 2016-01-15 MED ORDER — PREDNISONE 20 MG PO TABS: 60.0000 mg | ORAL_TABLET | Freq: Once | ORAL | Status: DC

## 2016-01-15 MED ORDER — OXYCODONE-ACETAMINOPHEN 5-325 MG PO TABS: 1.0000 | ORAL_TABLET | Freq: Once | ORAL | Status: DC

## 2016-01-15 MED ORDER — MORPHINE SULFATE (PF) 4 MG/ML IV SOLN
4.0000 mg | Freq: Once | INTRAVENOUS | Status: AC
  Administered 2016-01-15: 4 mg via INTRAVENOUS
  Filled 2016-01-15: qty 1

## 2016-01-15 NOTE — ED Notes (Signed)
Bed: [facility] Expected date:  Expected time:  Means of arrival:  Comments: EMS- 40yo F, back injury

## 2016-01-15 NOTE — ED Provider Notes (Signed)
CSN: [id number]     Arrival date & time 01/15/16  1148 History   First MD Initiated Contact with Patient 01/15/16 1209     Chief Complaint  Patient presents with  . Back Pain   HPI  [name] is an 40 y.o. female with history of interstitial cystitis, kidney stones, stroke, cervical cancer who presents to the ED for evaluation of back pain. She states she took out the trash this morning and since then has had progressive low back pain, worse on left side, with pain that radiates down left leg. She states she does not remember heavy lifting of the trash that suddenly triggered the pain, but she felt a twinge of pain/spasm after taking the trash out. States that she drove her husband to work and within the five minute drive her back pain severely intensified to the point that she could not get out of the car due to her pain. She states now the pain is a severe, 10/10 gripping pain that with squeeze/spasm and shoot down her left leg. She denies numbness, weakness, tingling. She has not tried anything to alleviate her pain. Denies fever, chills. Denies bowel/bladder incontinence.   Past Medical History  Diagnosis Date  . Migraine   . Kidney stone   . Interstitial cystitis   . Multiple allergies   . Endometriosis   . Hyperlipidemia   . Asthma     Exercise induced  . Cancer (HCC)     Cervical  . 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 Uterine cancer [facility])    Past Surgical History  Procedure Laterality Date  . Rhinoplasty    . Cystostomy w/ bladder biopsy    . Tubal ligation    . Knee surgery    . Tonsillectomy    . Cholecystectomy    . Ablation    . Leep    . Tendon repair Left 07/26/2015    Procedure: REPAIR WITH RECONSTRUCTION LEFT THUMB;  Surgeon: Charlotte Crumb, MD;  Location: [place];  Service: Orthopedics;  Laterality: Left;  . Colposcopy     Family History  Problem Relation Age of Onset  . Heart disease Mother   . COPD Mother   . Diabetes Father   .  Hypertension Father   . Stroke Father   . Heart attack Father   . Breast cancer Maternal Grandmother   . Cancer Maternal Grandfather     melanoma  . Heart disease Paternal Grandmother   . Stroke Paternal Grandmother   . Heart attack Paternal Grandmother   . Heart disease Paternal Grandfather   . Stroke Paternal Grandfather   . Heart attack Paternal Grandfather    Social History  Substance Use Topics  . Smoking status: Current Every Day Smoker -- 0.50 packs/day for 25 years    Types: Cigarettes  . Smokeless tobacco: Never Used  . Alcohol Use: Yes     Comment: rarely   OB History    Gravida Para Term Preterm AB TAB SAB Ectopic Multiple Living   5 3 3  2  2   3      Review of Systems  All other systems reviewed and are negative.     Allergies  Bee venom; Flexeril; Lavender oil; Robaxin; Amitriptyline; Entex; Singulair; Etodolac; Iodine; Mushroom extract complex; and Shellfish allergy  Home Medications   Prior to Admission medications   Medication Sig Start Date End Date Taking? Authorizing Provider  albuterol (PROVENTIL HFA;VENTOLIN HFA) 108 (  90 BASE) MCG/ACT inhaler Inhale 1-2 puffs into the lungs every 6 (six) hours as needed for wheezing or shortness of breath. 07/02/15  Yes Golden Circle, FNP  busPIRone (BUSPAR) 10 MG tablet TAKE 1 TABLET(10 MG) BY MOUTH THREE TIMES DAILY Patient taking differently: take 10 mg at bedtime 01/05/16  Yes Golden Circle, FNP  clindamycin (CLEOCIN) 150 MG capsule Take 3 capsules (450 mg total) by mouth 4 (four) times daily. X 7 days 12/01/15   Sherwood Gambler, MD  EPINEPHrine (EPIPEN 2-PAK) 0.3 mg/0.3 mL IJ SOAJ injection Inject 0.3 mg into the muscle once as needed (allergic reaction).    Historical Provider, MD  predniSONE (DELTASONE) 20 MG tablet Take 3 tablets by mouth daily for 2 days, then 2 tablets for 2 days, then 1 tablet for 2 days. Patient not taking: Reported on 12/01/2015 11/10/15   Pleas Koch, NP  rizatriptan (MAXALT) 10  MG tablet Take 0.5-1 tablets (5-10 mg total) by mouth as needed for migraine. May repeat in 2 hours if needed Patient not taking: Reported on 12/01/2015 06/15/15   Golden Circle, FNP   BP 112/79 mmHg  Pulse 73  Temp(Src) 97.9 F (36.6 C) (Oral)  Resp 18  SpO2 99% Physical Exam  Constitutional: She is oriented to person, place, and time.  Tearful, appears uncomfortable  HENT:  Right Ear: External ear normal.  Left Ear: External ear normal.  Nose: Nose normal.  Mouth/Throat: Oropharynx is clear and moist. No oropharyngeal exudate.  Eyes: Conjunctivae and EOM are normal. Pupils are equal, round, and reactive to light.  Neck: Normal range of motion. Neck supple.  Cardiovascular: Normal rate, regular rhythm, normal heart sounds and intact distal pulses.   Pulmonary/Chest: Effort normal and breath sounds normal. No respiratory distress. She has no wheezes. She exhibits no tenderness.  Abdominal: Soft. Bowel sounds are normal. She exhibits no distension. There is no tenderness. There is no guarding.  Musculoskeletal:  No c-spine or t-spine tenderness. Diffuse lumbosacral tenderness, left worse than right. There is midline lumbar tenderness. Diffuse left lumbar paraspinal spasm. FROM of hips. FROM of knees. Negative SLR.  Neurological: She is alert and oriented to person, place, and time. She displays no tremor. No cranial nerve deficit. Coordination normal.  UE and LE with intact strength and sensation bilaterally.   Pt able to bear weight though guarded/antalgic gait due to pain.  Skin: Skin is warm and dry.  Psychiatric: She has a normal mood and affect.  Nursing note and vitals reviewed.   ED Course  Procedures (including critical care time) Labs Review Labs Reviewed - No data to display  Imaging Review Dg Lumbar Spine Complete  01/15/2016  CLINICAL DATA:  Injured back this morning. EXAM: LUMBAR SPINE - COMPLETE 4+ VIEW COMPARISON:  11/10/2015. FINDINGS: Normal alignment of the  lumbar vertebral bodies. Disc spaces and vertebral bodies are maintained. The facets are normally aligned. No pars defects. The visualized bony pelvis is intact. IMPRESSION: Normal alignment and no acute bony findings or significant degenerative changes. Age advanced atherosclerotic calcifications involving the aorta. Electronically Signed   By: Marijo Sanes M.D.   On: 01/15/2016 13:42   I have personally reviewed and evaluated these images and lab results as part of my medical decision-making.   EKG Interpretation None      MDM   Final diagnoses:  Low back strain, initial encounter  Bilateral low back pain with left-sided sciatica    Due to midline tenderness and degree of pt's  pain Lumbar spine XR obtained. It was negative. Pt has no red flags concerning for cauda equina, epidural abscess, or other acute spinal cord pathology. Suspect msk strain/spasm. Pain is improved with toradol, pain meds, valium, and decadron. No focal neuro deficits. Pt is able to ambulate in the ED. Rx given for pain meds, valium. Instructed to f/u with PCP. ER return precautions given.    Anne Ng, PA-C 01/16/16 [id number]  Tanna Furry, MD 01/29/16 [phone]

## 2016-01-15 NOTE — Discharge Instructions (Signed)

## 2016-02-15 ENCOUNTER — Encounter (INDEPENDENT_AMBULATORY_CARE_PROVIDER_SITE_OTHER): Payer: Self-pay

## 2016-05-06 ENCOUNTER — Emergency Department (HOSPITAL_COMMUNITY)
Admission: EM | Admit: 2016-05-06 | Discharge: 2016-05-06 | Disposition: A | Discharge status: Home / Self Care | Payer: Self-pay | Attending: Emergency Medicine | Admitting: Emergency Medicine

## 2016-05-06 ENCOUNTER — Encounter (HOSPITAL_COMMUNITY): Payer: Self-pay | Admitting: Emergency Medicine

## 2016-05-06 DIAGNOSIS — Z8673 Personal history of transient ischemic attack (TIA), and cerebral infarction without residual deficits: Secondary | ICD-10-CM | POA: Insufficient documentation

## 2016-05-06 DIAGNOSIS — F17210 Nicotine dependence, cigarettes, uncomplicated: Secondary | ICD-10-CM | POA: Insufficient documentation

## 2016-05-06 DIAGNOSIS — E785 Hyperlipidemia, unspecified: Secondary | ICD-10-CM | POA: Insufficient documentation

## 2016-05-06 DIAGNOSIS — Z792 Long term (current) use of antibiotics: Secondary | ICD-10-CM | POA: Insufficient documentation

## 2016-05-06 DIAGNOSIS — Z91013 Allergy to seafood: Secondary | ICD-10-CM | POA: Insufficient documentation

## 2016-05-06 DIAGNOSIS — L03115 Cellulitis of right lower limb: Secondary | ICD-10-CM

## 2016-05-06 DIAGNOSIS — J45909 Unspecified asthma, uncomplicated: Secondary | ICD-10-CM | POA: Insufficient documentation

## 2016-05-06 DIAGNOSIS — Z79899 Other long term (current) drug therapy: Secondary | ICD-10-CM | POA: Insufficient documentation

## 2016-05-06 DIAGNOSIS — Z8541 Personal history of malignant neoplasm of cervix uteri: Secondary | ICD-10-CM | POA: Insufficient documentation

## 2016-05-06 MED ORDER — HYDROCODONE-ACETAMINOPHEN 5-325 MG PO TABS
1.0000 | ORAL_TABLET | Freq: Once | ORAL | Status: AC
  Administered 2016-05-06: 1 via ORAL
  Filled 2016-05-06: qty 1

## 2016-05-06 MED ORDER — DOXYCYCLINE HYCLATE 100 MG PO TABS
100.0000 mg | ORAL_TABLET | Freq: Once | ORAL | Status: AC
  Administered 2016-05-06: 100 mg via ORAL
  Filled 2016-05-06: qty 1

## 2016-05-06 MED ORDER — DOXYCYCLINE HYCLATE 100 MG PO CAPS: 100.0000 mg | ORAL_CAPSULE | Freq: Two times a day (BID) | ORAL | Status: DC

## 2016-05-06 NOTE — Discharge Instructions (Signed)
Cellulitis Cellulitis is an infection of the skin and the tissue beneath it. The infected area is usually red and tender. Cellulitis occurs most often in the arms and lower legs.  CAUSES  Cellulitis is caused by bacteria that enter the skin through cracks or cuts in the skin. The most common types of bacteria that cause cellulitis are staphylococci and streptococci. SIGNS AND SYMPTOMS   Redness and warmth.  Swelling.  Tenderness or pain.  Fever. DIAGNOSIS  Your health care provider can usually determine what is wrong based on a physical exam. Blood tests may also be done. TREATMENT  Treatment usually involves taking an antibiotic medicine. HOME CARE INSTRUCTIONS   Take your antibiotic medicine as directed by your health care provider. Finish the antibiotic even if you start to feel better.  Keep the infected arm or leg elevated to reduce swelling.  Apply a warm cloth to the affected area up to 4 times per day to relieve pain.  Take medicines only as directed by your health care provider.  Keep all follow-up visits as directed by your health care provider. SEEK MEDICAL CARE IF:   You notice red streaks coming from the infected area.  Your red area gets larger or turns dark in color.  Your bone or joint underneath the infected area becomes painful after the skin has healed.  Your infection returns in the same area or another area.  You notice a swollen bump in the infected area.  You develop new symptoms.  You have a fever. SEEK IMMEDIATE MEDICAL CARE IF:   You feel very sleepy.  You develop vomiting or diarrhea.  You have a general ill feeling (malaise) with muscle aches and pains.   This information is not intended to replace advice given to you by your health care provider. Make sure you discuss any questions you have with your health care provider.   Take antibiotics as prescribed. Follow up with your PCP in 3-4 days for a wound re-check. Return to the ED if you  experience increased redness or swelling around your wound, fevers, chills, severe pain.

## 2016-05-06 NOTE — ED Notes (Signed)
Per pt, states spider bite on right upper thigh

## 2016-05-06 NOTE — ED Provider Notes (Signed)
CSN: [id number]     Arrival date & time 05/06/16  1211 History  By signing my name below, I, Soijett Blue, attest that this documentation has been prepared under the direction and in the presence of Donnald Garre, PA-C Electronically Signed: Soijett Blue, ED Scribe. 05/06/2016. 12:45 PM.   Chief Complaint  Patient presents with  . Insect Bite     The history is provided by the patient. No language interpreter was used.    Brandi Morris is a 40 y.o. female who presents to the Emergency Department complaining of an insect bite to right upper thigh onset 3 days ago. 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 Pt denies seeing the spider, but reports that there were spiders, roaches, and fleas present in the house that she was working in. Pt reports that the area has progressively worsened since onset. Pt states that there is pain to her right upper thigh that is worsened with palpation. Denies alleviating factors. Pt is having associated symptoms of redness to site. Pt reports that she has tried benadryl and ibuprofen for the relief of her symptoms. Pt denies fever, chills, rash, gait problem and any other symptoms.   Past Medical History  Diagnosis Date  . Migraine   . Kidney stone   . Interstitial cystitis   . Multiple allergies   . Endometriosis   . Hyperlipidemia   . Asthma     Exercise induced  . Cancer (HCC)     Cervical  . 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 Uterine cancer [facility])    Past Surgical History  Procedure Laterality Date  . Rhinoplasty    . Cystostomy w/ bladder biopsy    . Tubal ligation    . Knee surgery    . Tonsillectomy    . Cholecystectomy    . Ablation    . Leep    . Tendon repair Left 07/26/2015    Procedure: REPAIR WITH RECONSTRUCTION LEFT THUMB;  Surgeon: Charlotte Crumb, MD;  Location: [place];  Service: Orthopedics;  Laterality: Left;  . Colposcopy     Family History   Problem Relation Age of Onset  . Heart disease Mother   . COPD Mother   . Diabetes Father   . Hypertension Father   . Stroke Father   . Heart attack Father   . Breast cancer Maternal Grandmother   . Cancer Maternal Grandfather     melanoma  . Heart disease Paternal Grandmother   . Stroke Paternal Grandmother   . Heart attack Paternal Grandmother   . Heart disease Paternal Grandfather   . Stroke Paternal Grandfather   . Heart attack Paternal Grandfather    Social History  Substance Use Topics  . Smoking status: Current Every Day Smoker -- 0.50 packs/day for 25 years    Types: Cigarettes  . Smokeless tobacco: Never Used  . Alcohol Use: Yes     Comment: rarely   OB History    Gravida Para Term Preterm AB TAB SAB Ectopic Multiple Living   5 3 3  2  2   3      Review of Systems  Constitutional: Negative for fever and chills.  Musculoskeletal: Negative for gait problem.  Skin: Positive for color change (redness to right upper thigh). Negative for rash.  All other systems reviewed and are negative.     Allergies  Bee venom;  Flexeril; Lavender oil; Robaxin; Amitriptyline; Entex; Singulair; Etodolac; Iodine; Mushroom extract complex; and Shellfish allergy  Home Medications   Prior to Admission medications   Medication Sig Start Date End Date Taking? Authorizing Provider  albuterol (PROVENTIL HFA;VENTOLIN HFA) 108 (90 BASE) MCG/ACT inhaler Inhale 1-2 puffs into the lungs every 6 (six) hours as needed for wheezing or shortness of breath. 07/02/15   Golden Circle, FNP  aspirin-acetaminophen-caffeine (EXCEDRIN MIGRAINE) [phone] MG tablet Take 3 tablets by mouth every 6 (six) hours as needed for headache.    Historical Provider, MD  busPIRone (BUSPAR) 10 MG tablet TAKE 1 TABLET(10 MG) BY MOUTH THREE TIMES DAILY Patient taking differently: take 10 mg at bedtime 01/05/16   Golden Circle, FNP  clindamycin (CLEOCIN) 150 MG capsule Take 3 capsules (450 mg total) by mouth 4  (four) times daily. X 7 days 12/01/15   Sherwood Gambler, MD  diazepam (VALIUM) 5 MG tablet Take 1 tablet (5 mg total) by mouth 2 (two) times daily. 01/15/16   Olivia Canter Sam, PA-C  diphenhydrAMINE (BENADRYL) 25 MG tablet Take 50 mg by mouth 4 (four) times daily.    Historical Provider, MD  EPINEPHrine (EPIPEN 2-PAK) 0.3 mg/0.3 mL IJ SOAJ injection Inject 0.3 mg into the muscle once as needed (allergic reaction).    Historical Provider, MD  ibuprofen (ADVIL,MOTRIN) 200 MG tablet Take 800 mg by mouth every 6 (six) hours as needed for headache or moderate pain.    Historical Provider, MD  naproxen (NAPROSYN) 500 MG tablet Take 1 tablet (500 mg total) by mouth 2 (two) times daily. 01/15/16   Anne Ng, PA-C  oxyCODONE-acetaminophen (PERCOCET/ROXICET) 5-325 MG tablet Take 1-2 tablets by mouth every 6 (six) hours as needed for severe pain. 01/15/16   Olivia Canter Sam, PA-C  predniSONE (DELTASONE) 20 MG tablet Take 3 tablets by mouth daily for 2 days, then 2 tablets for 2 days, then 1 tablet for 2 days. Patient not taking: Reported on 12/01/2015 11/10/15   Pleas Koch, NP  rizatriptan (MAXALT) 10 MG tablet Take 0.5-1 tablets (5-10 mg total) by mouth as needed for migraine. May repeat in 2 hours if needed Patient not taking: Reported on 12/01/2015 06/15/15   Golden Circle, FNP   BP 120/75 mmHg  Pulse 90  Temp(Src) 98.2 F (36.8 C) (Oral)  Resp 18  Ht 5\' 2"  (1.575 m)  Wt 150 lb (68.04 kg)  BMI 27.43 kg/m2  SpO2 100% Physical Exam  Constitutional: She is oriented to person, place, and time. She appears well-developed and well-nourished. No distress.  HENT:  Head: Normocephalic and atraumatic.  Eyes: Conjunctivae are normal. Right eye exhibits no discharge. Left eye exhibits no discharge. No scleral icterus.  Cardiovascular: Normal rate.   Pulmonary/Chest: Effort normal.  Neurological: She is alert and oriented to person, place, and time. Coordination normal.  Skin: Skin is warm and dry. No rash  noted. She is not diaphoretic. No erythema. No pallor.  3 cm x 3 cm area of erythema and warmth to right upper outer thigh with small central scabbing. No induration, fluctuance, drainage, or streaking.   Psychiatric: She has a normal mood and affect. Her behavior is normal.  Nursing note and vitals reviewed.   ED Course  Procedures (including critical care time) DIAGNOSTIC STUDIES: Oxygen Saturation is 100% on RA, nl by my interpretation.    COORDINATION OF CARE: 12:45 PM Discussed treatment plan with pt at bedside which includes abx Rx and pt agreed to plan.  Labs Review Labs Reviewed - No data to display  Imaging Review No results found.   EKG Interpretation None      MDM   Final diagnoses:  Cellulitis of right lower extremity   Patient presentation consistent with mild cellulitis likely secondary to insect bite. No underlying abscess. Afebrile. No tachycardia, hypotension or other symptoms suggestive of severe infection. Area has been demarcated and pt advised to follow up for wound check in 2-3 days, sooner for worsening systemic symptoms, new lymphangitis, or significant spread of erythema past line of demarcation. Will discharge with doxycyline Rx. Return precautions discussed. Pt appears safe for discharge.   I personally performed the services described in this documentation, which was scribed in my presence. The recorded information has been reviewed and is accurate.     Dondra Spry [place], PA-C 05/06/16 1458  Carmin Muskrat, MD 05/06/16 [phone]

## 2016-06-06 ENCOUNTER — Other Ambulatory Visit: Payer: Self-pay | Admitting: Obstetrics and Gynecology

## 2016-06-06 DIAGNOSIS — Z1231 Encounter for screening mammogram for malignant neoplasm of breast: Secondary | ICD-10-CM

## 2016-06-09 ENCOUNTER — Encounter (HOSPITAL_COMMUNITY): Payer: Self-pay | Admitting: Emergency Medicine

## 2016-06-09 ENCOUNTER — Emergency Department (HOSPITAL_COMMUNITY)
Admission: EM | Admit: 2016-06-09 | Discharge: 2016-06-09 | Disposition: A | Discharge status: Home / Self Care | Payer: Self-pay | Attending: Emergency Medicine | Admitting: Emergency Medicine

## 2016-06-09 DIAGNOSIS — Z8541 Personal history of malignant neoplasm of cervix uteri: Secondary | ICD-10-CM | POA: Insufficient documentation

## 2016-06-09 DIAGNOSIS — G43909 Migraine, unspecified, not intractable, without status migrainosus: Secondary | ICD-10-CM | POA: Insufficient documentation

## 2016-06-09 DIAGNOSIS — R110 Nausea: Secondary | ICD-10-CM

## 2016-06-09 DIAGNOSIS — R197 Diarrhea, unspecified: Secondary | ICD-10-CM | POA: Insufficient documentation

## 2016-06-09 DIAGNOSIS — Z79899 Other long term (current) drug therapy: Secondary | ICD-10-CM | POA: Insufficient documentation

## 2016-06-09 DIAGNOSIS — Z7982 Long term (current) use of aspirin: Secondary | ICD-10-CM | POA: Insufficient documentation

## 2016-06-09 DIAGNOSIS — E860 Dehydration: Secondary | ICD-10-CM | POA: Insufficient documentation

## 2016-06-09 DIAGNOSIS — J45909 Unspecified asthma, uncomplicated: Secondary | ICD-10-CM | POA: Insufficient documentation

## 2016-06-09 DIAGNOSIS — F17210 Nicotine dependence, cigarettes, uncomplicated: Secondary | ICD-10-CM | POA: Insufficient documentation

## 2016-06-09 DIAGNOSIS — E785 Hyperlipidemia, unspecified: Secondary | ICD-10-CM | POA: Insufficient documentation

## 2016-06-09 DIAGNOSIS — Z8542 Personal history of malignant neoplasm of other parts of uterus: Secondary | ICD-10-CM | POA: Insufficient documentation

## 2016-06-09 DIAGNOSIS — N390 Urinary tract infection, site not specified: Secondary | ICD-10-CM | POA: Insufficient documentation

## 2016-06-09 DIAGNOSIS — F439 Reaction to severe stress, unspecified: Secondary | ICD-10-CM

## 2016-06-09 DIAGNOSIS — Z8673 Personal history of transient ischemic attack (TIA), and cerebral infarction without residual deficits: Secondary | ICD-10-CM | POA: Insufficient documentation

## 2016-06-09 LAB — URINALYSIS, ROUTINE W REFLEX MICROSCOPIC
BILIRUBIN URINE: NEGATIVE
Glucose, UA: NEGATIVE mg/dL
KETONES UR: NEGATIVE mg/dL
LEUKOCYTES UA: NEGATIVE
NITRITE: POSITIVE — AB
PROTEIN: NEGATIVE mg/dL
Specific Gravity, Urine: 1.019 (ref 1.005–1.030)
pH: 6 (ref 5.0–8.0)

## 2016-06-09 LAB — CBC
HCT: 44.6 % (ref 36.0–46.0)
HEMOGLOBIN: 15.1 g/dL — AB (ref 12.0–15.0)
MCH: 30.6 pg (ref 26.0–34.0)
MCHC: 33.9 g/dL (ref 30.0–36.0)
MCV: 90.5 fL (ref 78.0–100.0)
PLATELETS: 287 10*3/uL (ref 150–400)
RBC: 4.93 MIL/uL (ref 3.87–5.11)
RDW: 13.8 % (ref 11.5–15.5)
WBC: 9.9 10*3/uL (ref 4.0–10.5)

## 2016-06-09 LAB — COMPREHENSIVE METABOLIC PANEL
ALK PHOS: 58 U/L (ref 38–126)
ALT: 11 U/L — AB (ref 14–54)
ANION GAP: 6 (ref 5–15)
AST: 13 U/L — ABNORMAL LOW (ref 15–41)
Albumin: 4.5 g/dL (ref 3.5–5.0)
BILIRUBIN TOTAL: 0.7 mg/dL (ref 0.3–1.2)
BUN: 12 mg/dL (ref 6–20)
CALCIUM: 9 mg/dL (ref 8.9–10.3)
CO2: 24 mmol/L (ref 22–32)
CREATININE: 0.74 mg/dL (ref 0.44–1.00)
Chloride: 106 mmol/L (ref 101–111)
GFR calc non Af Amer: >60 mL/min (ref >60)
Glucose, Bld: 134 mg/dL — ABNORMAL HIGH (ref 65–99)
Potassium: 4.3 mmol/L (ref 3.5–5.1)
Sodium: 136 mmol/L (ref 135–145)
TOTAL PROTEIN: 7.2 g/dL (ref 6.5–8.1)

## 2016-06-09 LAB — URINE MICROSCOPIC-ADD ON

## 2016-06-09 LAB — HCG, QUANTITATIVE, PREGNANCY

## 2016-06-09 LAB — LIPASE, BLOOD: Lipase: 20 U/L (ref 11–51)

## 2016-06-09 MED ORDER — CEPHALEXIN 500 MG PO CAPS: ORAL_CAPSULE | ORAL | 0 refills | Status: DC

## 2016-06-09 MED ORDER — DEXAMETHASONE SODIUM PHOSPHATE 10 MG/ML IJ SOLN
10.0000 mg | Freq: Once | INTRAMUSCULAR | Status: AC
Start: 2016-06-09 — End: 2016-06-09
  Administered 2016-06-09: 10 mg via INTRAVENOUS
  Filled 2016-06-09: qty 1

## 2016-06-09 MED ORDER — DIPHENHYDRAMINE HCL 50 MG/ML IJ SOLN
25.0000 mg | Freq: Once | INTRAMUSCULAR | Status: AC
  Administered 2016-06-09: 25 mg via INTRAVENOUS
  Filled 2016-06-09: qty 1

## 2016-06-09 MED ORDER — KETOROLAC TROMETHAMINE 30 MG/ML IJ SOLN
30.0000 mg | Freq: Once | INTRAMUSCULAR | Status: AC
  Administered 2016-06-09: 30 mg via INTRAVENOUS
  Filled 2016-06-09: qty 1

## 2016-06-09 MED ORDER — METOCLOPRAMIDE HCL 5 MG/ML IJ SOLN
10.0000 mg | Freq: Once | INTRAMUSCULAR | Status: AC
  Administered 2016-06-09: 10 mg via INTRAVENOUS
  Filled 2016-06-09: qty 2

## 2016-06-09 MED ORDER — METOCLOPRAMIDE HCL 10 MG PO TABS: 10.0000 mg | ORAL_TABLET | Freq: Four times a day (QID) | ORAL | 0 refills | Status: DC | PRN

## 2016-06-09 MED ORDER — SODIUM CHLORIDE 0.9 % IV BOLUS (SEPSIS)
1000.0000 mL | Freq: Once | INTRAVENOUS | Status: AC
  Administered 2016-06-09: 1000 mL via INTRAVENOUS

## 2016-06-09 NOTE — ED Triage Notes (Addendum)
Patient reports headache since Wednesday. Patient reports getting "extremely stressed on Wednesday and has had a headache since." Reports blurry vision since Thursday. Reports nausea and diarrhea on Thursday/Friday. Denies vomiting. Patient alert, oriented, ambulatory, speaking in full sentences.

## 2016-06-09 NOTE — ED Provider Notes (Signed)
[facility] DEPT Provider Note   CSN: [id number] Arrival date & time: 06/09/16  1248  First Provider Contact:  None       History   Chief Complaint Chief Complaint  Patient presents with  . Migraine  . Nausea    HPI Brandi Morris is a 40 y.o. female with a PMHx of chronic migraines, HLD, asthma, nephrolithiasis, and TIA/status migrainosus, who presents to the ED with complaints of migraine 4 days. Patient states that her migraine gradually began on Wednesday after she was stressed at home due to some family issues, and has been intermittently present since then. 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 She describes the headache as 7/10 intermittent throbbing nonradiating posterior head pain, worse with being stressed, being in the heat, or bending over, improved significantly with resting in a dark room, and unrelieved with Tylenol, ibuprofen, Benadryl, and Goody powders. She states this migraine is in a different location than her typical migraines but denies thunderclap onset. Associated symptoms include "fuzzy vision" in her left eye that she describes as a difficulty focusing but denies that it is blurred or having diplopia, nausea, and 1-2 episodes daily of loose nonbloody watery diarrhea. Her daughter is here today with her and also has diarrhea. She states that she only drinks sodas because she does not like drinking water, and acknowledges the fact that she is likely dehydrated which is contributing to her headache, and states that she only eats once a day and she knows this is probably not helping her headache.  She denies any fevers, chills, lightheadedness, syncope, diplopia, tinnitus, recent head injury, chest pain, shortness of breath, abdominal pain, vomiting, constipation, obstipation, melena, hematochezia, dysuria, hematuria, numbness, tingling, or focal weakness. Denies  any other associated symptoms. In the past she has come in for migraines and done well with the migraine cocktail consisting of toradol/benadryl/reglan, but does not like zofran because it causes her to have headaches and throw up more.    The history is provided by the patient. No language interpreter was used.  Migraine  This is a recurrent problem. The current episode started in the past 7 days. The problem occurs intermittently. The problem has been unchanged. Associated symptoms include headaches, nausea and a visual change ("fuzzy" vision in L eye, difficult to focus). Pertinent negatives include no abdominal pain, arthralgias, chest pain, chills, fever, myalgias, numbness, urinary symptoms, vomiting or weakness. Exacerbated by: being outside in the heat. She has tried NSAIDs, rest and acetaminophen for the symptoms. The treatment provided moderate (improvement with resting in dark room, but otherwise no improvement with other treatments) relief.    Past Medical History:  Diagnosis Date  . Asthma    Exercise induced  . Cancer (HCC)    Cervical  . Endometriosis   . Hyperlipidemia   . Interstitial cystitis   . Kidney stone   . Migraine   . Multiple allergies   . 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 Uterine cancer [facility])     Patient Active Problem List   Diagnosis Date Noted  . Obesity 07/15/2015  . Upper airway cough syndrome 07/14/2015  . Cough variant asthma 07/14/2015  . Abnormal Pap smear of cervix 09/19/2014  . Breast lump on right side at 11 o'clock position 08/23/2014  . NEVUS, ATYPICAL 05/26/2009  . Cigarette smoker 05/26/2009  .  FLANK PAIN, LEFT 04/07/2008  . NEPHROLITHIASIS, HX OF 10/19/2007  . Hyperlipidemia 07/30/2007  . Anxiety state 07/30/2007  . DEPRESSION 07/30/2007  . Migraine 07/30/2007  . ALLERGIC RHINITIS 07/30/2007  . Asthma 07/30/2007  . INTERSTITIAL CYSTITIS 07/30/2007  . INSOMNIA 07/30/2007  . PERIPHERAL EDEMA 07/30/2007     Past Surgical History:  Procedure Laterality Date  . ABLATION    . CHOLECYSTECTOMY    . COLPOSCOPY    . CYSTOSTOMY W/ BLADDER BIOPSY    . KNEE SURGERY    . LEEP    . RHINOPLASTY    . TENDON REPAIR Left 07/26/2015   Procedure: REPAIR WITH RECONSTRUCTION LEFT THUMB;  Surgeon: Charlotte Crumb, MD;  Location: [place];  Service: Orthopedics;  Laterality: Left;  . TONSILLECTOMY    . TUBAL LIGATION      OB History    Gravida Para Term Preterm AB Living   5 3 3   2 3    SAB TAB Ectopic Multiple Live Births   2               Home Medications    Prior to Admission medications   Medication Sig Start Date End Date Taking? Authorizing Provider  albuterol (PROVENTIL HFA;VENTOLIN HFA) 108 (90 BASE) MCG/ACT inhaler Inhale 1-2 puffs into the lungs every 6 (six) hours as needed for wheezing or shortness of breath. 07/02/15   Golden Circle, FNP  aspirin-acetaminophen-caffeine (EXCEDRIN MIGRAINE) [phone] MG tablet Take 3 tablets by mouth every 6 (six) hours as needed for headache.    Historical Provider, MD  busPIRone (BUSPAR) 10 MG tablet TAKE 1 TABLET(10 MG) BY MOUTH THREE TIMES DAILY Patient taking differently: take 10 mg at bedtime 01/05/16   Golden Circle, FNP  clindamycin (CLEOCIN) 150 MG capsule Take 3 capsules (450 mg total) by mouth 4 (four) times daily. X 7 days 12/01/15   Sherwood Gambler, MD  diazepam (VALIUM) 5 MG tablet Take 1 tablet (5 mg total) by mouth 2 (two) times daily. 01/15/16   Olivia Canter Sam, PA-C  diphenhydrAMINE (BENADRYL) 25 MG tablet Take 50 mg by mouth 4 (four) times daily.    Historical Provider, MD  doxycycline (VIBRAMYCIN) 100 MG capsule Take 1 capsule (100 mg total) by mouth 2 (two) times daily. 05/06/16   Samantha Tripp Dowless, PA-C  EPINEPHrine (EPIPEN 2-PAK) 0.3 mg/0.3 mL IJ SOAJ injection Inject 0.3 mg into the muscle once as needed (allergic reaction).    Historical Provider, MD  ibuprofen (ADVIL,MOTRIN) 200 MG tablet Take 800 mg by mouth every 6 (six) hours  as needed for headache or moderate pain.    Historical Provider, MD  naproxen (NAPROSYN) 500 MG tablet Take 1 tablet (500 mg total) by mouth 2 (two) times daily. 01/15/16   Anne Ng, PA-C  oxyCODONE-acetaminophen (PERCOCET/ROXICET) 5-325 MG tablet Take 1-2 tablets by mouth every 6 (six) hours as needed for severe pain. 01/15/16   Olivia Canter Sam, PA-C  predniSONE (DELTASONE) 20 MG tablet Take 3 tablets by mouth daily for 2 days, then 2 tablets for 2 days, then 1 tablet for 2 days. Patient not taking: Reported on 12/01/2015 11/10/15   Pleas Koch, NP  rizatriptan (MAXALT) 10 MG tablet Take 0.5-1 tablets (5-10 mg total) by mouth as needed for migraine. May repeat in 2 hours if needed Patient not taking: Reported on 12/01/2015 06/15/15   Golden Circle, FNP    Family History Family History  Problem Relation Age of Onset  . Heart disease  Mother   . COPD Mother   . Diabetes Father   . Hypertension Father   . Stroke Father   . Heart attack Father   . Breast cancer Maternal Grandmother   . Cancer Maternal Grandfather     melanoma  . Heart disease Paternal Grandmother   . Stroke Paternal Grandmother   . Heart attack Paternal Grandmother   . Heart disease Paternal Grandfather   . Stroke Paternal Grandfather   . Heart attack Paternal Grandfather     Social History Social History  Substance Use Topics  . Smoking status: Current Every Day Smoker    Packs/day: 0.50    Years: 25.00    Types: Cigarettes  . Smokeless tobacco: Never Used  . Alcohol use Yes     Comment: rarely     Allergies   Bee venom; Flexeril [cyclobenzaprine]; Lavender oil; Robaxin [methocarbamol]; Amitriptyline; Entex; Singulair [montelukast sodium]; Etodolac; Iodine; Mushroom extract complex; and Shellfish allergy   Review of Systems Review of Systems  Constitutional: Negative for chills and fever.  HENT: Negative for facial swelling (no head inj) and tinnitus.   Eyes: Positive for photophobia and visual  disturbance ("fuzzy" vision in L eye, stating it's difficult to focus, but denies diplopia or blurred vision).  Respiratory: Negative for shortness of breath.   Cardiovascular: Negative for chest pain.  Gastrointestinal: Positive for diarrhea and nausea. Negative for abdominal pain, blood in stool, constipation and vomiting.  Genitourinary: Negative for dysuria and hematuria.  Musculoskeletal: Negative for arthralgias, myalgias and neck stiffness.  Skin: Negative for color change.  Allergic/Immunologic: Negative for immunocompromised state.  Neurological: Positive for headaches. Negative for syncope, weakness, light-headedness and numbness.  Psychiatric/Behavioral: Negative for confusion.       +stress   10 Systems reviewed and are negative for acute change except as noted in the HPI.   Physical Exam Updated Vital Signs BP 110/77 (BP Location: Right Arm)   Pulse 78   Temp 98.7 F (37.1 C) (Oral)   Resp 20   Ht 5\' 2"  (1.575 m)   Wt 70.3 kg   SpO2 100%   BMI 28.35 kg/m   Physical Exam  Constitutional: She is oriented to person, place, and time. Vital signs are normal. She appears well-developed and well-nourished.  Non-toxic appearance. No distress.  Afebrile, nontoxic, NAD  HENT:  Head: Normocephalic and atraumatic.  Mouth/Throat: Uvula is midline, oropharynx is clear and moist and mucous membranes are normal.  No facial droop. Uvula midline with symmetric tongue protrusion  Eyes: Conjunctivae and EOM are normal. Pupils are equal, round, and reactive to light. Right eye exhibits no discharge. Left eye exhibits no discharge.  PERRL, EOMI, no nystagmus, no visual field deficits   Neck: Normal range of motion. Neck supple. No spinous process tenderness and no muscular tenderness present. No neck rigidity. Normal range of motion present.  FROM intact without spinous process TTP, no bony stepoffs or deformities, no paraspinous muscle TTP or muscle spasms. No rigidity or meningeal  signs. No bruising or swelling.   Cardiovascular: Normal rate, regular rhythm, normal heart sounds and intact distal pulses.  Exam reveals no gallop and no friction rub.   No murmur heard. Pulmonary/Chest: Effort normal and breath sounds normal. No respiratory distress. She has no decreased breath sounds. She has no wheezes. She has no rhonchi. She has no rales.  Abdominal: Soft. Normal appearance and bowel sounds are normal. She exhibits no distension. There is no tenderness. There is no rigidity, no rebound, no  guarding, no CVA tenderness, no tenderness at McBurney's point and negative Murphy's sign.  Soft, NTND, +BS throughout, no r/g/r, neg murphy's, neg mcburney's, no CVA TTP   Musculoskeletal: Normal range of motion.  MAE x4 Strength and sensation grossly intact Distal pulses intact Gait steady and nonataxic  Neurological: She is alert and oriented to person, place, and time. She has normal strength. No cranial nerve deficit or sensory deficit. She displays a negative Romberg sign. Coordination and gait normal. GCS eye subscore is 4. GCS verbal subscore is 5. GCS motor subscore is 6.  CN 2-12 grossly intact A&O x4 GCS 15 Sensation and strength intact Gait nonataxic including with tandem walking Coordination with finger-to-nose WNL Neg pronator drift, neg romberg  Skin: Skin is warm, dry and intact. No rash noted.  Psychiatric: She has a normal mood and affect.  Nursing note and vitals reviewed.    ED Treatments / Results  Labs (all labs ordered are listed, but only abnormal results are displayed) Labs Reviewed  COMPREHENSIVE METABOLIC PANEL - Abnormal; Notable for the following:       Result Value   Glucose, Bld 134 (*)    AST 13 (*)    ALT 11 (*)    All other components within normal limits  CBC - Abnormal; Notable for the following:    Hemoglobin 15.1 (*)    All other components within normal limits  URINALYSIS, ROUTINE W REFLEX MICROSCOPIC (NOT AT [facility]) - Abnormal;  Notable for the following:    APPearance CLOUDY (*)    Hgb urine dipstick SMALL (*)    Nitrite POSITIVE (*)    All other components within normal limits  URINE MICROSCOPIC-ADD ON - Abnormal; Notable for the following:    Squamous Epithelial / LPF 0-5 (*)    Bacteria, UA MANY (*)    All other components within normal limits  URINE CULTURE  LIPASE, BLOOD  HCG, QUANTITATIVE, PREGNANCY    EKG  EKG Interpretation None       Radiology No results found.  Procedures Procedures (including critical care time)  Medications Ordered in ED Medications  metoCLOPramide (REGLAN) injection 10 mg (10 mg Intravenous Given 06/09/16 1751)    And  diphenhydrAMINE (BENADRYL) injection 25 mg (25 mg Intravenous Given 06/09/16 1751)    And  sodium chloride 0.9 % bolus 1,000 mL (1,000 mLs Intravenous New Bag/Given 06/09/16 1750)    And  ketorolac (TORADOL) 30 MG/ML injection 30 mg (30 mg Intravenous Given 06/09/16 1751)  dexamethasone (DECADRON) injection 10 mg (10 mg Intravenous Given 06/09/16 1751)     Initial Impression / Assessment and Plan / ED Course  I have reviewed the triage vital signs and the nursing notes.  Pertinent labs & imaging results that were available during my care of the patient were reviewed by me and considered in my medical decision making (see chart for details).  Clinical Course    40 y.o. female here with migraine x4 days, and nausea/diarrhea, and "fuzzy" vision in the L eye, describing it as a difficulty focusing but denies blurred vision or diplopia. 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 Only eats once daily, and admits she's not been staying hydrated. No focal neuro deficits, exam overall unremarkable. Labs relatively unremarkable aside from Hgb being higher than normal which likely indicates hemoconcentration and further bolsters the fact that she's dehydrated. States this HA is in a different location than  typical  migraines, but was not sudden onset. Doubt meningitis, SAH, CVA, or other secondary causes of HA. Doubt need for head imaging. Awaiting U/A, but otherwise will proceed with migraine cocktail including decadron to prevent rebound HA. Doubt need for further labs/imaging today. Will reassess shortly.   7:03 PM Pt feeling MUCH better, feels ready to go home. Nausea improved, tolerating PO well here. U/A with +nitrites, neg leuks, 0-5 squamous, 0-5 WBCs, and many bacteria; could represent contaminated sample although strange to have +nitrites from vaginal contamination, will send culture but start on short course of abx. Discussed BRAT diet for diarrhea, although I suspect this could be from heat exposure/not eating adequately. Will send home with reglan for HA/nausea, discussed tylenol/motrin use and importance of staying hydrated. F/up with PCP in 1wk for recheck. I explained the diagnosis and have given explicit precautions to return to the ER including for any other new or worsening symptoms. The patient understands and accepts the medical plan as it's been dictated and I have answered their questions. Discharge instructions concerning home care and prescriptions have been given. The patient is STABLE and is discharged to home in good condition.   Final Clinical Impressions(s) / ED Diagnoses   Final diagnoses:  Migraine without status migrainosus, not intractable, unspecified migraine type  Nausea  UTI (lower urinary tract infection)  Diarrhea, unspecified type  Dehydration  Stress at home    New Prescriptions New Prescriptions   CEPHALEXIN (KEFLEX) 500 MG CAPSULE    1 caps po bid x 3 days   METOCLOPRAMIDE (REGLAN) 10 MG TABLET    Take 1 tablet (10 mg total) by mouth every 6 (six) hours as needed for nausea (nausea/headache).     [address] [place], PA-C 06/09/16 1904    Ripley Fraise, MD 06/09/16 2020

## 2016-06-09 NOTE — ED Notes (Signed)
Offered patient zofran. Patient declined.

## 2016-06-09 NOTE — Discharge Instructions (Signed)
Your headache and symptoms could be related to heat exposure and not being hydrated enough. Use Ibuprofen and Tylenol as needed for pain. Use reglan as needed for headaches or nausea. Stay well hydrated. Get plenty of rest. Your urine showed evidence of infection, take antibiotic as directed and until completed. The lab will call you if the urine culture shows bacterial growth that WON'T be covered by your antibiotic, but they won't call you if the antibiotic is appropriate. Follow a BRAT diet (listed below) to help with diarrhea. Follow up with your regular doctor in 1 week for recheck of symptoms and ongoing management of your headaches. Return to the ER for changes or worsening symptoms.

## 2016-06-11 LAB — URINE CULTURE

## 2016-07-05 ENCOUNTER — Encounter (HOSPITAL_COMMUNITY): Payer: Self-pay

## 2016-07-05 ENCOUNTER — Ambulatory Visit (HOSPITAL_COMMUNITY)
Admission: RE | Admit: 2016-07-05 | Discharge: 2016-07-05 | Disposition: A | Discharge status: Home / Self Care | Payer: Self-pay | Source: Ambulatory Visit | Attending: Obstetrics and Gynecology | Admitting: Obstetrics and Gynecology

## 2016-07-05 ENCOUNTER — Ambulatory Visit
Admission: RE | Admit: 2016-07-05 | Discharge: 2016-07-05 | Disposition: A | Discharge status: Home / Self Care | Payer: No Typology Code available for payment source | Source: Ambulatory Visit | Attending: Obstetrics and Gynecology | Admitting: Obstetrics and Gynecology

## 2016-07-05 VITALS — BP 108/70 | Temp 98.9°F | Ht 62.0 in | Wt 160.0 lb

## 2016-07-05 DIAGNOSIS — Z1231 Encounter for screening mammogram for malignant neoplasm of breast: Secondary | ICD-10-CM

## 2016-07-05 DIAGNOSIS — Z1239 Encounter for other screening for malignant neoplasm of breast: Secondary | ICD-10-CM

## 2016-07-05 NOTE — Addendum Note (Signed)
Encounter addended by: Loletta Parish, RN on: 07/05/2016  2:47 PM<BR>    Actions taken: Sign clinical note

## 2016-07-05 NOTE — Progress Notes (Signed)
Wet prep 

## 2016-07-05 NOTE — Progress Notes (Addendum)
Complaints of bilateral breast tenderness all the time. Per patient bilateral breast have been tender for years. Patient complained of AUB, bleeding during intercourse, and pain with intercourse.  Pap Smear: Pap smear completed today. Last Pap smear was in September 2016 at Dr. Elza Rafter office and abnormal. Per patient it was recommended she have a hysterectomy after last Pap smear due to her history of multiple abnormal Pap smears and AUB. Patient has a history of an abnormal Pap smear 08/23/2014 that was ASCUS with positive HPV. 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 Per patient has a history of an abnormal Pap smear in 2008 that required a repeat Pap smear for follow up that was normal. Last Pap smear result is not in EPIC. Previous Pap smear and follow up is in EPIC.   Physical exam: Breasts Breasts symmetrical. No skin abnormalities bilateral breasts. No nipple retraction bilateral breasts. No nipple discharge bilateral breasts. No lymphadenopathy. No lumps palpated bilateral breasts. Complaints of bilateral outer breast tenderness on exam. Referred patient to the [facility] for a screening mammogram. Appointment scheduled for Friday, July 05, 2016 at 1340.  Pelvic/Bimanual   Ext Genitalia No lesions, no swelling and no discharge observed on external genitalia.         Vagina Vagina pink and normal texture. No lesions and thick white discharge observed in vagina. Wet prep completed.         Cervix Cervix is present. Cervix pink and of normal texture. Thick white discharge observed on cervix.    Uterus Uterus is present and palpable. Uterus in normal position and normal size. Patient complained of tenderness on palpation.        Adnexae Bilateral ovaries present and palpable. Patient complained of tenderness on palpation.       Rectovaginal No rectal exam completed today since patient had no rectal  complaints. No skin abnormalities observed on exam.    Smoking History: Patient is a current smoker. Discussed smoking cessation with patient. Referred patient to the [facility] Quitline and gave resources to free classes offered at [facility].  Patient Navigation: Patient education provided. Access to services provided for patient through [facility] program.

## 2016-07-05 NOTE — Patient Instructions (Addendum)
Explained breast self awareness to [employer]. Let patient know that her next Pap smear and follow up will be based on the result to today's Pap smear. Referred patient to the [facility] for a screening mammogram. Appointment scheduled for Friday, July 05, 2016 at 1340. Let patient know will follow up with her within the next couple weeks with results of Pap smear and wet prep by phone. Informed patient that the [facility] will follow up with her within the next couple of weeks with results of mammogram by letter or phone. Discussed smoking cessation with patient. Referred patient to the [facility] Quitline and gave resources to free classes offered at [facility]. Brandi Morris verbalized understanding.  Melane Windholz, Arvil Chaco, RN 2:15 PM

## 2016-07-09 ENCOUNTER — Encounter (HOSPITAL_COMMUNITY): Payer: Self-pay | Admitting: *Deleted

## 2016-07-09 ENCOUNTER — Telehealth (HOSPITAL_COMMUNITY): Payer: Self-pay | Admitting: *Deleted

## 2016-07-09 LAB — CYTOLOGY - PAP

## 2016-07-09 NOTE — Telephone Encounter (Signed)
Telephoned patient at home # and discussed negative pap smear results. HPV was negative. Next pap smear due in one year due to history of abnormal pap smears. . Patient was asking about referral that was discussed during exam and wanted to know if was done. Advised would talk with provider and give call back. Patient voiced understanding.

## 2016-08-26 ENCOUNTER — Encounter: Payer: Self-pay | Admitting: Obstetrics and Gynecology

## 2016-08-26 ENCOUNTER — Ambulatory Visit: Payer: No Typology Code available for payment source | Admitting: Obstetrics and Gynecology

## 2016-08-26 NOTE — Progress Notes (Signed)
Patient did not keep GYN appointment for 08/26/2016.  Durene Romans MD Attending Center for [employer] farm manager)

## 2016-09-11 ENCOUNTER — Ambulatory Visit (INDEPENDENT_AMBULATORY_CARE_PROVIDER_SITE_OTHER): Payer: Self-pay | Admitting: Family

## 2016-09-11 ENCOUNTER — Encounter: Payer: Self-pay | Admitting: Family

## 2016-09-11 VITALS — BP 120/76 | HR 83 | Temp 98.5°F | Resp 16 | Ht 62.0 in | Wt 163.0 lb

## 2016-09-11 DIAGNOSIS — F411 Generalized anxiety disorder: Secondary | ICD-10-CM

## 2016-09-11 DIAGNOSIS — M549 Dorsalgia, unspecified: Secondary | ICD-10-CM | POA: Insufficient documentation

## 2016-09-11 DIAGNOSIS — M5442 Lumbago with sciatica, left side: Secondary | ICD-10-CM

## 2016-09-11 DIAGNOSIS — G8929 Other chronic pain: Secondary | ICD-10-CM

## 2016-09-11 MED ORDER — IBUPROFEN-FAMOTIDINE 800-26.6 MG PO TABS: 1.0000 | ORAL_TABLET | Freq: Three times a day (TID) | ORAL | 1 refills | Status: DC | PRN

## 2016-09-11 MED ORDER — PREDNISONE 20 MG PO TABS: ORAL_TABLET | ORAL | 0 refills | Status: DC

## 2016-09-11 MED ORDER — KETOROLAC TROMETHAMINE 60 MG/2ML IM SOLN
60.0000 mg | Freq: Once | INTRAMUSCULAR | Status: AC
  Administered 2016-09-11: 60 mg via INTRAMUSCULAR

## 2016-09-11 MED ORDER — METHYLPREDNISOLONE ACETATE 80 MG/ML IJ SUSP
80.0000 mg | Freq: Once | INTRAMUSCULAR | Status: AC
  Administered 2016-09-11: 80 mg via INTRAMUSCULAR

## 2016-09-11 MED ORDER — BUSPIRONE HCL 10 MG PO TABS: 10.0000 mg | ORAL_TABLET | Freq: Three times a day (TID) | ORAL | 2 refills | Status: DC

## 2016-09-11 NOTE — Progress Notes (Signed)
Subjective:    Patient ID: Brandi Morris, female    DOB: 1976/07/12, 40 y.o.   MRN: [id number]  Chief Complaint  Patient presents with  . Back Pain    lower back, left hip bone, left butt check and leg, x2 months every day pain, right shoulder knot    HPI:  Brandi Morris is a 40 y.o. female who  has a past medical history of Asthma; Endometriosis; Hyperlipidemia; Interstitial cystitis; Kidney stone; Migraine; Multiple allergies; and Stroke ([place]). and presents today for an office visit.  1.) Back pain - This is a new problem. Associated symptom of pain located in her lower back started about 1 year ago following sneezing while taking out a bag of trash. Pain is located on the left side of her lower back and radiates down the back of her leg. The pain is severe enough to effect her sleep. Modifying factors include TENS, ibuprofen, heat, ice and massage. Course of the symptoms have gotten worse over the past 2 months. Aggravated by sitting for long periods of time. She works remodeling homes for a living. She also wears a back brace at work. Previous x-rays performed in the ED reviewed with normal alignment, good disc space and no degenerative changes.   2.) Anxiety - Previously maintained on buspar for anxiety which she discontinued taking because her symptoms were improved. Recently she has begun to feel the associated symptom of increased anxiety secondary to work and family stressors. She has tried non-pharmacological stress relief which has only helped minimally. Would like to restart Buspar.    Allergies  Allergen Reactions  . Bee Venom Anaphylaxis  . Flexeril [Cyclobenzaprine] Other (See Comments)    Reaction:  Makes pt violent   . Lavender Oil Anaphylaxis and Other (See Comments)    Pt states that her lungs and sinuses fill-up with fluid.    . Robaxin [Methocarbamol] Other (See Comments)    Reaction:  Makes pt violent   . Amitriptyline Other (See Comments)    Reaction:   Hallucinations   . Entex Other (See Comments)    Reaction:  Dizziness   . Singulair [Montelukast Sodium] Other (See Comments)    Reaction:  Agitation  Pt states that her sinuses fill-up with fluid.    . Etodolac Other (See Comments)    Reaction:  Dizziness   . Iodine Hives  . Mushroom Extract Complex Hives  . Shellfish Allergy Hives      Outpatient Medications Prior to Visit  Medication Sig Dispense Refill  . albuterol (PROVENTIL HFA;VENTOLIN HFA) 108 (90 BASE) MCG/ACT inhaler Inhale 1-2 puffs into the lungs every 6 (six) hours as needed for wheezing or shortness of breath. 18 g 2  . aspirin-acetaminophen-caffeine (EXCEDRIN MIGRAINE) [id number] MG tablet Take 4 tablets by mouth every 6 (six) hours as needed.     . diphenhydrAMINE (BENADRYL) 25 MG tablet Take 50 mg by mouth 4 (four) times daily.    Marland Kitchen EPINEPHrine (EPIPEN 2-PAK) 0.3 mg/0.3 mL IJ SOAJ injection Inject 0.3 mg into the muscle once as needed (for severe allergic reaction).     Marland Kitchen ibuprofen (ADVIL,MOTRIN) 200 MG tablet Take 800 mg by mouth every 6 (six) hours as needed for headache, mild pain or moderate pain.     . busPIRone (BUSPAR) 10 MG tablet Take 10 mg by mouth 3 (three) times daily.    . cephALEXin (KEFLEX) 500 MG capsule 1 caps po bid x 3 days (Patient not taking: Reported on 07/05/2016) 6  capsule 0  . diazepam (VALIUM) 5 MG tablet Take 1 tablet (5 mg total) by mouth 2 (two) times daily. (Patient not taking: Reported on 06/09/2016) 10 tablet 0  . doxycycline (VIBRAMYCIN) 100 MG capsule Take 1 capsule (100 mg total) by mouth 2 (two) times daily. (Patient not taking: Reported on 06/09/2016) 20 capsule 0  . metoCLOPramide (REGLAN) 10 MG tablet Take 1 tablet (10 mg total) by mouth every 6 (six) hours as needed for nausea (nausea/headache). (Patient not taking: Reported on 07/05/2016) 20 tablet 0  . naproxen (NAPROSYN) 500 MG tablet Take 1 tablet (500 mg total) by mouth 2 (two) times daily. (Patient not taking: Reported on  06/09/2016) 30 tablet 0  . oxyCODONE-acetaminophen (PERCOCET/ROXICET) 5-325 MG tablet Take 1-2 tablets by mouth every 6 (six) hours as needed for severe pain. (Patient not taking: Reported on 06/09/2016) 10 tablet 0   No facility-administered medications prior to visit.       Past Surgical History:  Procedure Laterality Date  . ABLATION    . CERVICAL BIOPSY  W/ LOOP ELECTRODE EXCISION    . CHOLECYSTECTOMY    . COLPOSCOPY    . CYSTOSTOMY W/ BLADDER BIOPSY    . KNEE SURGERY    . LEEP    . RHINOPLASTY    . TENDON REPAIR Left 07/26/2015   Procedure: REPAIR WITH RECONSTRUCTION LEFT THUMB;  Surgeon: Charlotte Crumb, MD;  Location: [facility];  Service: Orthopedics;  Laterality: Left;  . TONSILLECTOMY    . TUBAL LIGATION        Past Medical History:  Diagnosis Date  . Asthma    Exercise induced  . Endometriosis   . Hyperlipidemia   . Interstitial cystitis   . Kidney stone   . Migraine   . Multiple allergies   . Stroke [facility])    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      Review of Systems  Constitutional: Negative for chills and fever.  Musculoskeletal: Positive for back pain.  Neurological: Negative for weakness and numbness.      Objective:    BP 120/76 (BP Location: Left Arm, Patient Position: Sitting, Cuff Size: Normal)   Pulse 83   Temp 98.5 F (36.9 C) (Oral)   Resp 16   Ht 5\' 2"  (1.575 m)   Wt 163 lb (73.9 kg)   SpO2 98%   BMI 29.81 kg/m  Nursing note and vital signs reviewed.  Physical Exam  Constitutional: She is oriented to person, place, and time. She appears well-developed and well-nourished. No distress.  Cardiovascular: Normal rate, regular rhythm, normal heart sounds and intact distal pulses.   Pulmonary/Chest: Effort normal and breath sounds normal.  Musculoskeletal:  Low back - no obvious deformity, discoloration, or edema. Palpable tenderness along left SI joint and left paraspinal musculature and sciatic nerve. No deformity or crepitus noted. Range  of motion is full in flexion and limited in extension and left lateral bending. Distal pulses and sensation are intact and appropriate. Positive Faber's test on left and negative straight leg raise.  Neurological: She is alert and oriented to person, place, and time.  Skin: Skin is warm and dry.  Psychiatric: She has a normal mood and affect. Her behavior is normal. Judgment and thought content normal.       Assessment & Plan:   Problem List Items Addressed This Visit      Other   Anxiety state    Recently began experiencing exacerbation of her anxiety and not currently maintained on  medication. Restart BuSpar. Discussed stress and stress management techniques. Follow-up in one month or sooner if needed.      Relevant Medications   busPIRone (BUSPAR) 10 MG tablet   Back pain - Primary    Given mechanism of flexion, rotation, and increased intrathoracic pressure there is concern for possible disc-related pathology currently causing her sciatica pain. In office injection of Toradol and Depo-Medrol provided. Start prednisone Dosepak. Start Duexis. Recommend ice and home exercise therapy. If symptoms worsen or do not improve consider additional imaging and physical therapy.      Relevant Medications   Ibuprofen-Famotidine 800-26.6 MG TABS   predniSONE (DELTASONE) 20 MG tablet   ketorolac (TORADOL) injection 60 mg (Completed)   methylPREDNISolone acetate (DEPO-MEDROL) injection 80 mg (Completed)    Other Visit Diagnoses   None.      I have discontinued Ms. Rys's oxyCODONE-acetaminophen, naproxen, diazepam, doxycycline, metoCLOPramide, and cephALEXin. I have also changed her busPIRone. Additionally, I am having her start on Ibuprofen-Famotidine and predniSONE. Lastly, I am having her maintain her albuterol, EPINEPHrine, ibuprofen, aspirin-acetaminophen-caffeine, and diphenhydrAMINE. We administered ketorolac and methylPREDNISolone acetate.   Meds ordered this encounter    Medications  . busPIRone (BUSPAR) 10 MG tablet    Sig: Take 1 tablet (10 mg total) by mouth 3 (three) times daily.    Dispense:  90 tablet    Refill:  2    Order Specific Question:   Supervising Provider    Answer:   Pricilla Holm A [id number]  . Ibuprofen-Famotidine 800-26.6 MG TABS    Sig: Take 1 tablet by mouth 3 (three) times daily as needed.    Dispense:  90 tablet    Refill:  1    Order Specific Question:   Supervising Provider    Answer:   Pricilla Holm A [id number]  . predniSONE (DELTASONE) 20 MG tablet    Sig: Days 1-4: Take 3 tablets by mouth daily; Days 5-8: Take 2 tablets by mouth daily; Days 9-12: Take 1 tablet by mouth daily.    Dispense:  24 tablet    Refill:  0    Order Specific Question:   Supervising Provider    Answer:   Pricilla Holm A [id number]  . ketorolac (TORADOL) injection 60 mg  . methylPREDNISolone acetate (DEPO-MEDROL) injection 80 mg     Follow-up: Return in about 1 month (around 10/12/2016), or if symptoms worsen or fail to improve.  Mauricio Po, FNP

## 2016-09-11 NOTE — Assessment & Plan Note (Signed)
Recently began experiencing exacerbation of her anxiety and not currently maintained on medication. Restart BuSpar. Discussed stress and stress management techniques. Follow-up in one month or sooner if needed.

## 2016-09-11 NOTE — Assessment & Plan Note (Signed)
Given mechanism of flexion, rotation, and increased intrathoracic pressure there is concern for possible disc-related pathology currently causing her sciatica pain. In office injection of Toradol and Depo-Medrol provided. Start prednisone Dosepak. Start Duexis. Recommend ice and home exercise therapy. If symptoms worsen or do not improve consider additional imaging and physical therapy.

## 2016-09-11 NOTE — Patient Instructions (Signed)
Thank you for choosing [facility].  SUMMARY AND INSTRUCTIONS:  Ice x 20 minutes every 2 hours and after activities and before bed.   Exercises and stretches throughout the day.  Duexis - 3x per day   Medication:  Your prescription(s) have been submitted to your pharmacy or been printed and provided for you. Please take as directed and contact our office if you believe you are having problem(s) with the medication(s) or have any questions.  Imaging / Radiology:  Please stop by radiology on the basement level of the building for your x-rays. Your results will be released to [facility] (or called to you) after review, usually within 72 hours after test completion. If any treatments or changes are necessary, you will be notified at that same time.  Follow up:  If your symptoms worsen or fail to improve, please contact our office for further instruction, or in case of emergency go directly to the emergency room at the closest medical facility.    Herniated Disk With Rehab Between each vertebrae of the spine exists a disk. These disks contain a jelly-like material that helps cushion the spinal column. Occasionally, damage to the supportive ligaments of the vertebrae causes a disk to shift from its normal alignment and place pressure on surrounding structures, such as the spinal cord. This is called a herniated (ruptured) disk. SYMPTOMS   Pain in the back, that often affects one side.  Pain that gets worse with movement, sneezing, coughing, or straining.  Muscle spasms in the back.  Pain, numbness, or weakness affecting one arm or leg (depending on whether injury is in the neck or low back).  Muscle loss (if the condition has become chronic).  Loss of stool (bowel) or urine (bladder) function. CAUSES  Herniated disks result when a disk becomes weak. The disk eventually ruptures and places pressure on the spinal cord. Herniated disks may occur from sudden injury (acute trauma)  such as heavy labor, or from ongoing (chronic) stress, such as obesity.  RISK INCREASES WITH:  Sports that involve downward or twisting pressure on the neck or spine (football, weightlifting, horseback riding competition, bowling, tennis, jogging, track, racquetball, gymnastics).  Poor strength and flexibility.  Failure to warm up properly before activity.  Family history of low back pain or disk disorders.  Previous back surgery (especially fusion).  Preexisting forward displacement of a vertebra (spondylolisthesis).  Poor technique when lifting.  Prolonged sitting, especially with poor posture. PREVENTION  Learn and use proper technique when sitting or lifting.  Warm up and stretch properly before activity.  Maintain physical fitness:  Strength, flexibility, and endurance.  Cardiovascular fitness.  Maintain a healthy body weight.  If previously injured, avoid any intense physical activity that requires twisting of the body under uncontrollable conditions. PROGNOSIS  If treated properly, herniated disks are usually curable within 6 weeks. Sometimes, surgery is required.  RELATED COMPLICATIONS   Permanent numbness, weakness, or paralysis and muscle loss.  Chronic back pain.  Loss of bowel or bladder function.  Decreased sexual function.  Risks of surgery: infection, bleeding, injury to nerves (persistent or increased numbness, weakness, or paralysis), persistent back pain, and spinal headache. TREATMENT Treatment first involves resting from any aggravating activities and the use of ice and medicine to reduce pain and inflammation. As muscle spasms begin to decrease, it is important to perform strengthening and stretching exercises of the back muscles. These will help teach and reinforce proper body posture. These exercises may be performed at home, or with  a therapist. A therapist may complete a further evaluation and recommend additional treatments, such as ultrasound,  traction (for herniated disks of the neck), a cervical collar (for herniated disks of the neck), or a corset or back brace (for herniated disks of the low back). Prolonged rest may do more harm than good. Your therapist will teach you proper techniques for performing simple activities, such as lifting an object off the floor or using proper posture while sitting. At night, it is advised that you sleep on your back, on a firm mattress, and place a pillow under your knees. Your caregiver may recommend oral steroids or an injection of corticosteroids in the space around the spinal cord (epidural space) in order to reduce pain and inflammation. For severe cases, surgery is recommended. MEDICATION   If pain medicine is needed, nonsteroidal anti-inflammatory medicines (aspirin and ibuprofen), or other minor pain relievers (acetaminophen), are often advised.  Do not take pain medicine for 7 days before surgery.  Prescription pain relievers may be given if your caregiver thinks they are needed. Use only as directed and only as much as you need.  Ointments applied to the skin may be helpful.  Corticosteroid injections may be given. These injections should be reserved for the most serious cases, as they can only be given a certain number of times.  Oral steroids may be given to reduce inflammation, although not usually for severe (acute) injuries. HEAT AND COLD  Cold treatment (icing) relieves pain and reduces inflammation. Cold treatment should be applied for 10 to 15 minutes every 2 to 3 hours, and immediately after activity that aggravates your symptoms. Use ice packs or an ice massage.  Heat treatment may be used before performing stretching and strengthening activities prescribed by your caregiver, physical therapist, or athletic trainer. Use a heat pack or a warm water soak. SEEK MEDICAL CARE IF:   Symptoms get worse or do not improve in 2 to 4 weeks, despite treatment.  You develop loss of bowel  or bladder function.  New, unexplained symptoms develop. (Drugs used in treatment may produce side effects.) EXERCISES  RANGE OF MOTION (ROM) AND STRETCHING EXERCISES - Herniated Disk (Ruptured Disk) Most people with low back pain will find that their symptoms get worse with excessive bending forward (flexion) or arching at the low back (extension). The exercises that will help resolve your symptoms will focus on the opposite motion. Your physician, physical therapist or athletic trainer will help you determine which exercises will be most helpful to resolve your low back pain. Do not complete any exercises without first consulting with your caregiver. Discontinue any exercises that make your symptoms worse, until you speak to your caregiver. If you have pain, numbness or tingling that travels down into your buttocks, leg or foot, the goal of this therapy is for these symptoms to move closer to your back and to eventually go away. Sometimes, these leg symptoms will get better, but your low back pain may get worse. This is typically an indication of progress in your rehabilitation. Be sure to be very alert to any changes in your symptoms and to the activities you have done in the 24 hours prior to the change. Sharing this information with your caregiver will allow him or her to best treat your condition. These exercises may help you when beginning to rehabilitate your injury. Your symptoms may go away with or without further involvement from your physician, physical therapist or athletic trainer. While completing these exercises, remember:  Restoring tissue flexibility helps normal motion to return to the joints. This allows healthier, less painful movement and activity.  An effective stretch should be held for at least 30 seconds.  A stretch should never be painful. You should only feel a gentle lengthening or release in the stretched tissue. FLEXION RANGE OF MOTION AND STRETCHING EXERCISES: STRETCH -  Flexion, Single Knee to Chest  Lie on a firm bed or floor, with both legs extended in front of you.  Keeping one leg in contact with the floor, bring your opposite knee to your chest. Hold your leg in place by either grabbing behind your thigh or at your knee.  Pull until you feel a gentle stretch in your low back. Hold for __________ seconds.  Slowly release your grasp and repeat the exercise with the opposite side. Repeat __________ times. Complete this exercise __________ times per day.  STRETCH - Flexion, Double Knee to Chest   Lie on a firm bed or floor, with both legs extended in front of you.  Keeping one leg in contact with the floor, bring your opposite knee to your chest.  Tense your stomach muscles to support your back and then lift your other knee to your chest. Hold your legs in place by either grabbing behind your thighs or at your knees.  Pull both knees toward your chest until you feel a gentle stretch in your low back. Hold for __________ seconds.  Tense your stomach muscles and slowly return one leg at a time to the floor. Repeat __________ times. Complete this exercise __________ times per day.  STRETCH - Low Trunk Rotation  Lie on a firm bed or floor. Keeping your legs in front of you, bend your knees so they are both pointed toward the ceiling and your feet are flat on the floor.  Extend your arms out to the side. This will stabilize your upper body by keeping your shoulders in contact with the floor.  Gently and slowly drop both knees together to one side, until you feel a gentle stretch in your low back. Hold for __________ seconds.  Tense your stomach muscles to support your low back as you bring your knees back to the starting position. Repeat the exercise while dropping both knees to the other side. Repeat __________ times. Complete this exercise __________ times per day  EXTENSION RANGE OF MOTION AND FLEXIBILITY EXERCISES: STRETCH - Extension, Prone on  Elbows   Lie on your stomach on the floor. (A bed will be too soft.) Place your palms about shoulder width apart.  Place your elbows under your shoulders. If this is too painful, stack pillows under your chest.  Allow your body to relax so that your hips drop lower and make contact more completely with the floor.  Hold this position for __________ seconds.  Slowly return to lying flat on the floor. Repeat __________ times. Complete this exercise __________ times per day.  RANGE OF MOTION - Extension, Prone Press Ups   Lie on your stomach on the floor. (A bed will be too soft.) Place your palms about shoulder width apart and at the height of your head.  Keeping your back as relaxed as possible, slowly straighten your elbows while keeping your hips on the floor. You may adjust the placement of your hands to maximize your comfort. As you gain motion, your hands will come more underneath your shoulders.  Hold this position for __________ seconds.  Slowly return to lying flat on the floor. Repeat  __________ times. Complete this exercise __________ times per day.  RANGE OF MOTION- Quadruped, Neutral Spine   Assume a hands and knees position on a firm surface. Keep your hands under your shoulders and your knees under your hips. You may place padding under your knees for comfort.  Drop your head and point your tail bone toward the ground below you. This will round out your low back like an angry cat. Hold this position for __________ seconds.  Slowly lift your head and release your tail bone so that your back sags into a large arch, like an old horse.  Hold this position for __________ seconds.  Repeat this until you feel limber in your low back.  Now, find your "sweet spot." This will be the most comfortable position somewhere between the two previous positions. This is your neutral spine. Once you have found this position, tense your stomach muscles to support your low back.  Hold this  position for __________ seconds. Repeat __________ times. Complete this exercise __________ times per day.  STRENGTHENING EXERCISES - Herniated Disk (Ruptured Disk) These exercises may help you when beginning to rehabilitate your injury. These exercises should be done near your "sweet spot." This is the neutral, low-back arch, somewhere between fully rounded and fully arched, that is your least painful position. When performed in this safe range of motion, these exercises can be used for people who have either a flexion or extension based injury. These exercises may resolve your symptoms with or without further involvement from your physician, physical therapist or athletic trainer. While completing these exercises, remember:   Muscles can gain both the endurance and the strength needed for everyday activities through controlled exercises.  Complete these exercises as instructed by your physician, physical therapist or athletic trainer. Increase the resistance and repetitions only as guided.  You may experience muscle soreness or fatigue, but the pain or discomfort you are trying to eliminate should never worsen during these exercises. If this pain does get worse, stop and make sure you are following the directions exactly. If the pain is still present after adjustments, discontinue the exercise until you can discuss the trouble with your clinician. STRENGTHENING - Deep Abdominals, Pelvic Tilt   Lie on a firm bed or floor. Keeping your legs in front of you, bend your knees so they are both pointed toward the ceiling and your feet are flat on the floor.  Tense your lower abdominal muscles to press your low back into the floor. This motion will rotate your pelvis so that your tail bone is scooping upwards rather than pointing at your feet or into the floor.  With a gentle tension and even breathing, hold this position for __________ seconds. Repeat __________ times. Complete this exercise __________  times per day.  STRENGTHENING - Abdominals, Crunches   Lie on a firm bed or floor. Keeping your legs in front of you, bend your knees so they are both pointed toward the ceiling and your feet are flat on the floor. Cross your arms over your chest.  Slightly tip your chin down without bending your neck.  Tense your abdominals and slowly lift your trunk high enough so that your shoulder blades are just off the floor. Lifting higher can put too much stress on the low back and does not further strengthen your abdominal muscles.  With control, return to the starting position. Repeat __________ times. Complete this exercise __________ times per day.  STRENGTHENING - Quadruped, Opposite UE/LE Lift  Assume  a hands and knees position on a firm surface. Keep your hands under your shoulders and your knees under your hips. You may place padding under your knees for comfort.  Find your neutral spine and gently tense your abdominal muscles so that you can maintain this position. Your shoulders and hips should form a rectangle that is parallel with the floor and is not twisted.  Keeping your trunk steady, lift your right hand no higher than your shoulder. Then lift your left leg no higher than your hip. Make sure you are not holding your breath. Hold this position for __________ seconds.  Continuing to keep your abdominal muscles tense and your back steady, slowly return to your starting position. Repeat with the opposite arm and leg. Repeat __________ times. Complete this exercise __________ times per day.  STRENGTHENING - Lower Abdominals, Double Knee Lift  Lie on a firm bed or floor. Keeping your legs in front of you, bend your knees so they are both pointed toward the ceiling and your feet are flat on the floor.  Tense your abdominal muscles to brace your low back and slowly lift both of your knees until they come over your hips. Be certain not to hold your breath.  Hold for __________ seconds. Using  your abdominal muscles, return to the starting position in a slow and controlled manner. Repeat __________ times. Complete this exercise __________ times per day.  POSTURE AND BODY MECHANICS CONSIDERATIONS - Herniated Disc (Ruptured Disk) Keeping correct posture when sitting, standing or completing your activities will reduce the stress put on different body tissues, allowing injured tissues a chance to heal and limiting painful experiences. The following are general guidelines for improved posture. Your physician or physical therapist will provide you with any instructions specific to your needs. While reading these guidelines, remember:  The exercises prescribed by your provider will help you build the flexibility and strength to maintain correct postures.  The correct posture provides the best environment for your joints to work. All of your joints have less wear and tear when properly supported by a spine with good posture. This means you will experience a healthier, less painful body.  Correct posture must be practiced with all of your activities, especially prolonged sitting and standing. Correct posture is as important when doing repetitive low-stress activities (typing) as it is when doing a single heavy-load activity (lifting). RESTING POSITIONS Consider which positions are most painful for you when choosing a resting position. If you have pain with flexion-based activities (sitting, bending, stooping, squatting), choose a position that allows you to rest in a less flexed posture. You would want to avoid curling into a fetal position on your side. If your pain gets worse with extension-based activities (prolonged standing, working overhead), avoid resting in an extended position such as sleeping on your stomach. Most people will find more comfort when they rest with their spine in a more neutral position, neither too rounded nor too arched. Lying on a non-sagging bed on your side, with a pillow  between your knees, or on your back with a pillow under your knees will often provide some relief. Keep in mind, being in any one position for a prolonged period of time, no matter how correct your posture, can still lead to stiffness. PROPER SITTING POSTURE In order to minimize stress and discomfort on your spine, you must sit with correct posture. Sitting with good posture should be effortless for a healthy body. Returning to good posture is a gradual process.  Many people can work toward this most comfortably by using various supports until they have the flexibility and strength to maintain this posture on their own. When sitting with proper posture, your ears will fall over your shoulders and your shoulders will fall over your hips. You should use the back of the chair to support your upper back. Your low back will be in a neutral position, just slightly arched. You may place a small pillow or folded towel at the base of your low back for support.  When working at a desk, create an environment that supports good, upright posture. Without extra support, muscles tire, which leads to excessive strain on joints and other tissues. Keep these recommendations in mind. CHAIR  A chair should be able to slide under your desk when your back makes contact with the back of the chair. This allows you to work closely.  The chair's height should allow your eyes to be level with the upper part of your monitor and your hands to be slightly lower than your elbows. BODY POSITION  Your feet should make contact with the floor. If this is not possible, use a foot rest.  Keep your ears over your shoulders. This will reduce stress on your neck and low back. INCORRECT SITTING POSTURES  If you are feeling tired and unable to assume a healthy sitting posture, do not slouch or slump. This puts excessive strain on your back tissues, causing more damage and pain. Healthier options include:  Using more support, like a lumbar  pillow.  Switching tasks, to something that requires you to be upright or walking.  Talking a brief walk.  Lying down to rest in a neutral-spine position. PROLONGED STANDING WHILE SLIGHTLY LEANING FORWARD  When completing a task that requires you to lean forward while standing in one place for a long time, place either foot up on a stationary 2-4 inch high object, to help maintain the best posture. When both feet are on the ground, the low back tends to lose its slight inward curve. If this curve flattens (or becomes too large), the back and your other joints will experience too much stress, tire more quickly and can cause pain. CORRECT STANDING POSTURES Proper standing posture should be assumed with all daily activities, even if they only take a few moments, like when brushing your teeth. As in sitting, your ears should fall over your shoulders and your shoulders should fall over your hips. You should keep a slight tension in your abdominal muscles to brace your spine. Your tailbone should point down to the ground, not behind your body, resulting in an over-extended swayback posture.  INCORRECT STANDING POSTURES  Common incorrect standing postures include a forward head, locked knees or an excessive swayback. WALKING Walk with an upright posture. Your ears, shoulders and hips should all line-up. PROLONGED ACTIVITY IN A FLEXED POSITION When completing a task that requires you to bend forward at your waist or lean over a low surface, try finding a way to stabilize 3 out of 4 of your limbs. You can place a hand or elbow on your thigh, or rest a knee on the surface you are reaching across. This will provide you more stability so that your muscles do not tire as quickly. By keeping your knees relaxed, or slightly bent, you will also reduce stress across your low back. CORRECT LIFTING TECHNIQUES DO :   Assume a wide stance. This will provide you more stability and the opportunity to get as  close as  possible to the object you are lifting.  Tense your abdominals to brace your spine. Then, bend at the knees and hips. Keeping your back locked in a neutral-spine position, lift using your leg muscles. Lift with your legs, keeping your back straight.  Test the weight of unknown objects before attempting to lift them.  Try to keep your elbows down by your sides, in order get the best strength from your shoulders when carrying an object.  Always ask for help when lifting heavy or awkward objects. INCORRECT LIFTING TECHNIQUES DO NOT:   Lock your knees when lifting, even if it is a small object.  Bend and twist. Pivot at your feet or move your feet when needing to change directions.  Assume that you can safely pick up even a paper clip, without proper posture.   This information is not intended to replace advice given to you by your health care provider. Make sure you discuss any questions you have with your health care provider.   Document Released: 11/04/2005 Document Revised: 11/25/2014 Document Reviewed: 02/16/2009 Elsevier Interactive Patient Education [employer].

## 2016-12-25 ENCOUNTER — Encounter (HOSPITAL_COMMUNITY): Payer: Self-pay | Admitting: Emergency Medicine

## 2016-12-25 ENCOUNTER — Emergency Department (HOSPITAL_COMMUNITY)
Admission: EM | Admit: 2016-12-25 | Discharge: 2016-12-25 | Disposition: A | Discharge status: Home / Self Care | Payer: No Typology Code available for payment source | Attending: Emergency Medicine | Admitting: Emergency Medicine

## 2016-12-25 DIAGNOSIS — Y929 Unspecified place or not applicable: Secondary | ICD-10-CM | POA: Insufficient documentation

## 2016-12-25 DIAGNOSIS — Z79899 Other long term (current) drug therapy: Secondary | ICD-10-CM | POA: Insufficient documentation

## 2016-12-25 DIAGNOSIS — Y939 Activity, unspecified: Secondary | ICD-10-CM | POA: Insufficient documentation

## 2016-12-25 DIAGNOSIS — W57XXXA Bitten or stung by nonvenomous insect and other nonvenomous arthropods, initial encounter: Secondary | ICD-10-CM | POA: Insufficient documentation

## 2016-12-25 DIAGNOSIS — F17210 Nicotine dependence, cigarettes, uncomplicated: Secondary | ICD-10-CM | POA: Insufficient documentation

## 2016-12-25 DIAGNOSIS — S81051A Open bite, right knee, initial encounter: Secondary | ICD-10-CM | POA: Insufficient documentation

## 2016-12-25 DIAGNOSIS — Y999 Unspecified external cause status: Secondary | ICD-10-CM | POA: Insufficient documentation

## 2016-12-25 DIAGNOSIS — L03115 Cellulitis of right lower limb: Secondary | ICD-10-CM

## 2016-12-25 DIAGNOSIS — J45909 Unspecified asthma, uncomplicated: Secondary | ICD-10-CM | POA: Insufficient documentation

## 2016-12-25 DIAGNOSIS — Z8673 Personal history of transient ischemic attack (TIA), and cerebral infarction without residual deficits: Secondary | ICD-10-CM | POA: Insufficient documentation

## 2016-12-25 DIAGNOSIS — Z7982 Long term (current) use of aspirin: Secondary | ICD-10-CM | POA: Insufficient documentation

## 2016-12-25 MED ORDER — DOXYCYCLINE HYCLATE 100 MG PO CAPS
100.0000 mg | ORAL_CAPSULE | Freq: Two times a day (BID) | ORAL | 0 refills | Status: DC
Start: 2016-12-25 — End: 2018-08-18

## 2016-12-25 NOTE — ED Triage Notes (Signed)
Pt states that she noticed a spider bite/abscess on medial side of rt knee.  Pt shows picture of bite with streaking up leg, however no streaking is noted now.

## 2016-12-25 NOTE — Discharge Instructions (Signed)
The possible insect bite you have on your right leg has become infected. We will treat this with doxycycline for 10 days. Please take this antibiotic as prescribed and until finished. You may take ibuprofen or Tylenol for pain. Place ice on the site to decrease inflammation. Please monitor the redness around the site it should decrease in the next 2-3 days. Return to the emergency department if you notice the redness has worsened as you may need a different type of antibiotic.

## 2016-12-25 NOTE — ED Provider Notes (Signed)
[place] DEPT Provider Note   CSN: [id number] Arrival date & time: 12/25/16  [id number]  By signing my name below, I, Higinio Plan, attest that this documentation has been prepared under the direction and in the presence of Carmon Sails, PA-C . Electronically Signed: Higinio Plan, Scribe. 12/25/2016. 11:01 AM.  History   Chief Complaint Chief Complaint  Patient presents with  . Abscess   The history is provided by the patient. No language interpreter was used.   HPI Comments: TANAI SCAVONE is a 41 y.o. female with PMHx of interstitial cystitis, who presents to the Emergency Department for an evaluation of a possible "spider bite" to her right medial knee that occurred ~1 week ago. Pt reports she was working in a house "infested with [employer] and spiders" last week and believes she was bit by a spider; though, she denies seeing a spider bite her. She states associated redness and mild "yellow" driange from the site and notes her symptoms are exacerbated at night. Pt reports she visited the ED on 05/06/16 for a similar complaint in which she was treated with doxycycline with complete relief. She denies fever and any knee pain surrounding her bite.   Past Medical History:  Diagnosis Date  . Asthma    Exercise induced  . Endometriosis   . Hyperlipidemia   . Interstitial cystitis   . Kidney stone   . Migraine   . Multiple allergies   . 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 Back pain 09/11/2016  . Obesity 07/15/2015  . Upper airway cough syndrome 07/14/2015  . Cough variant asthma 07/14/2015  . Abnormal Pap smear of cervix 09/19/2014  . Breast lump on right side at 11 o'clock position 08/23/2014  . NEVUS, ATYPICAL 05/26/2009  . Cigarette smoker 05/26/2009  . FLANK PAIN, LEFT 04/07/2008  . NEPHROLITHIASIS, HX OF 10/19/2007  . Hyperlipidemia 07/30/2007  . Anxiety state 07/30/2007  . DEPRESSION 07/30/2007  .  Migraine 07/30/2007  . ALLERGIC RHINITIS 07/30/2007  . Asthma 07/30/2007  . INTERSTITIAL CYSTITIS 07/30/2007  . INSOMNIA 07/30/2007  . PERIPHERAL EDEMA 07/30/2007    Past Surgical History:  Procedure Laterality Date  . ABLATION    . CERVICAL BIOPSY  W/ LOOP ELECTRODE EXCISION    . CHOLECYSTECTOMY    . COLPOSCOPY    . CYSTOSTOMY W/ BLADDER BIOPSY    . KNEE SURGERY    . LEEP    . RHINOPLASTY    . TENDON REPAIR Left 07/26/2015   Procedure: REPAIR WITH RECONSTRUCTION LEFT THUMB;  Surgeon: Charlotte Crumb, MD;  Location: [place];  Service: Orthopedics;  Laterality: Left;  . TONSILLECTOMY    . TUBAL LIGATION      OB History    Gravida Para Term Preterm AB Living   5 3 3   2 3    SAB TAB Ectopic Multiple Live Births   2             Home Medications    Prior to Admission medications   Medication Sig Start Date End Date Taking? Authorizing Provider  albuterol (PROVENTIL HFA;VENTOLIN HFA) 108 (90 BASE) MCG/ACT inhaler Inhale 1-2 puffs into the lungs every 6 (six) hours as needed for wheezing or shortness of breath. 07/02/15   Golden Circle, FNP  aspirin-acetaminophen-caffeine (EXCEDRIN MIGRAINE) [phone] MG tablet Take 4 tablets by mouth every 6 (six) hours as needed.     Historical  Provider, MD  busPIRone (BUSPAR) 10 MG tablet Take 1 tablet (10 mg total) by mouth 3 (three) times daily. 09/11/16   Golden Circle, FNP  diphenhydrAMINE (BENADRYL) 25 MG tablet Take 50 mg by mouth 4 (four) times daily.    Historical Provider, MD  doxycycline (VIBRAMYCIN) 100 MG capsule Take 1 capsule (100 mg total) by mouth 2 (two) times daily. 12/25/16   Kinnie Feil, PA-C  EPINEPHrine (EPIPEN 2-PAK) 0.3 mg/0.3 mL IJ SOAJ injection Inject 0.3 mg into the muscle once as needed (for severe allergic reaction).     Historical Provider, MD  ibuprofen (ADVIL,MOTRIN) 200 MG tablet Take 800 mg by mouth every 6 (six) hours as needed for headache, mild pain or moderate pain.     Historical Provider, MD    Ibuprofen-Famotidine 800-26.6 MG TABS Take 1 tablet by mouth 3 (three) times daily as needed. 09/11/16   Golden Circle, FNP  predniSONE (DELTASONE) 20 MG tablet Days 1-4: Take 3 tablets by mouth daily; Days 5-8: Take 2 tablets by mouth daily; Days 9-12: Take 1 tablet by mouth daily. 09/11/16   Golden Circle, FNP    Family History Family History  Problem Relation Age of Onset  . Heart disease Mother   . COPD Mother   . Diabetes Father   . Hypertension Father   . Stroke Father   . Heart disease Father   . Breast cancer Maternal Grandmother   . Cancer Maternal Grandfather     melanoma  . Heart disease Paternal Grandmother   . Stroke Paternal Grandmother   . Heart attack Paternal Grandmother   . Heart disease Paternal Grandfather   . Stroke Paternal Grandfather   . Heart attack Paternal Grandfather     Social History Social History  Substance Use Topics  . Smoking status: Current Every Day Smoker    Packs/day: 0.50    Years: 25.00    Types: Cigarettes  . Smokeless tobacco: Never Used  . Alcohol use Yes     Comment: rarely     Allergies   Bee venom; Flexeril [cyclobenzaprine]; Lavender oil; Robaxin [methocarbamol]; Amitriptyline; Entex; Singulair [montelukast sodium]; Etodolac; Iodine; Mushroom extract complex; and Shellfish allergy   Review of Systems Review of Systems  Constitutional: Negative for fever.  Musculoskeletal: Negative for arthralgias.  Skin: Positive for color change.       +"spider bite" to right medial knee   Physical Exam Updated Vital Signs BP 130/100 (BP Location: Left Arm)   Pulse 100   Temp 98 F (36.7 C) (Oral)   Resp 16   SpO2 100%   Physical Exam  Constitutional: She is oriented to person, place, and time. She appears well-developed and well-nourished.  HENT:  Head: Normocephalic.  Eyes: EOM are normal.  Neck: Normal range of motion.  Pulmonary/Chest: Effort normal.  Abdominal: She exhibits no distension.  Musculoskeletal:  Normal range of motion.  No bony tenderness, warmth, erythema or edema over right knee, tibia or fibula.  Full ROM of right knee without tenderness.    Neurological: She is alert and oriented to person, place, and time.  Skin:  2 cm pustule to the right medial knee with mild surrounding erythema, warmth, and tenderness.   Psychiatric: She has a normal mood and affect.  Nursing note and vitals reviewed.    ED Treatments / Results  DIAGNOSTIC STUDIES:  Oxygen Saturation is 100% on RA, normal by my interpretation.    COORDINATION OF CARE:  10:56 AM Discussed treatment  plan with pt at bedside and pt agreed to plan.  Labs (all labs ordered are listed, but only abnormal results are displayed) Labs Reviewed - No data to display  EKG  EKG Interpretation None       Radiology No results found.  Procedures Procedures (including critical care time)  Medications Ordered in ED Medications - No data to display  Initial Impression / Assessment and Plan / ED Course  I have reviewed the triage vital signs and the nursing notes.  Pertinent labs & imaging results that were available during my care of the patient were reviewed by me and considered in my medical decision making (see chart for details).    41 year old female presents with right medial knee pustule with mild surrounding erythema, tenderness. Patient believes she was bit by an insect last week while working on a house, although patient did not physically see an insect bite her. Patient states she has had similar episodes of skin infection/bites in the past which have always cleared with antibiotics without complications. On exam patient is afebrile, there is a 2 cm pustule to the right medial knee with surrounding erythema and tenderness. No bony tenderness over the right knee or lower extremity. Not concerned for expansion of infection to joints or septic arthritis. I reviewed patient's chart, she was last seen 6 months ago for  similar issue and was successfully treated with doxycycline. We will prescribe doxycycline again. Patient is aware of red flag symptoms to monitor for that would warrant return to the emergency department. Picture attached to chart for further monitoring in case patient does not respond to doxycycline.  I personally performed the services described in this documentation, which was scribed in my presence. The recorded information has been reviewed and is accurate.   Final Clinical Impressions(s) / ED Diagnoses   Final diagnoses:  Cellulitis of right lower extremity  Insect bite, initial encounter    New Prescriptions New Prescriptions   DOXYCYCLINE (VIBRAMYCIN) 100 MG CAPSULE    Take 1 capsule (100 mg total) by mouth 2 (two) times daily.     Kinnie Feil, PA-C 12/25/16 1111    Carmin Muskrat, MD 12/26/16 [phone]

## 2017-01-17 ENCOUNTER — Encounter (HOSPITAL_COMMUNITY): Payer: Self-pay | Admitting: Emergency Medicine

## 2017-01-17 ENCOUNTER — Emergency Department (HOSPITAL_COMMUNITY)
Admission: EM | Admit: 2017-01-17 | Discharge: 2017-01-17 | Disposition: A | Discharge status: Home / Self Care | Payer: No Typology Code available for payment source | Attending: Emergency Medicine | Admitting: Emergency Medicine

## 2017-01-17 ENCOUNTER — Emergency Department (HOSPITAL_COMMUNITY): Payer: No Typology Code available for payment source

## 2017-01-17 DIAGNOSIS — J45909 Unspecified asthma, uncomplicated: Secondary | ICD-10-CM | POA: Insufficient documentation

## 2017-01-17 DIAGNOSIS — Z7982 Long term (current) use of aspirin: Secondary | ICD-10-CM | POA: Insufficient documentation

## 2017-01-17 DIAGNOSIS — Y929 Unspecified place or not applicable: Secondary | ICD-10-CM | POA: Insufficient documentation

## 2017-01-17 DIAGNOSIS — Z8673 Personal history of transient ischemic attack (TIA), and cerebral infarction without residual deficits: Secondary | ICD-10-CM | POA: Insufficient documentation

## 2017-01-17 DIAGNOSIS — Y999 Unspecified external cause status: Secondary | ICD-10-CM | POA: Insufficient documentation

## 2017-01-17 DIAGNOSIS — W228XXA Striking against or struck by other objects, initial encounter: Secondary | ICD-10-CM | POA: Insufficient documentation

## 2017-01-17 DIAGNOSIS — S92421A Displaced fracture of distal phalanx of right great toe, initial encounter for closed fracture: Secondary | ICD-10-CM | POA: Insufficient documentation

## 2017-01-17 DIAGNOSIS — Y9302 Activity, running: Secondary | ICD-10-CM | POA: Insufficient documentation

## 2017-01-17 DIAGNOSIS — F17210 Nicotine dependence, cigarettes, uncomplicated: Secondary | ICD-10-CM | POA: Insufficient documentation

## 2017-01-17 MED ORDER — HYDROCODONE-ACETAMINOPHEN 5-325 MG PO TABS
1.0000 | ORAL_TABLET | Freq: Once | ORAL | Status: AC
  Administered 2017-01-17: 1 via ORAL
  Filled 2017-01-17: qty 1

## 2017-01-17 MED ORDER — IBUPROFEN 800 MG PO TABS: 800.0000 mg | ORAL_TABLET | Freq: Three times a day (TID) | ORAL | 0 refills | Status: DC

## 2017-01-17 MED ORDER — HYDROCODONE-ACETAMINOPHEN 5-325 MG PO TABS: 1.0000 | ORAL_TABLET | Freq: Four times a day (QID) | ORAL | 0 refills | Status: DC | PRN

## 2017-01-17 NOTE — ED Provider Notes (Signed)
[place] DEPT Provider Note   CSN: [id number] Arrival date & time: 01/17/17  1919 By signing my name below, I, Brandi Morris, attest that this documentation has been prepared under the direction and in the presence of non-physician practitioner, [name], PA-C. Electronically Signed: [name], Scribe. 01/17/2017. 8:58 PM   History   Chief Complaint Chief Complaint  Patient presents with  . Foot Pain    HPI Brandi Morris is a 41 y.o. female who presents to the Emergency Department complaining of sudden onset, moderate right foot pain onset today at 2 pm. Pt states she accidentally kicked a steel beam while chasing her dogs. She describes her pain as throbbing, constant pain that is exacerbated by bearing weight or with pressure. Pt states she is currently unable to bear weight on her right foot due to pain. She has taken Ibuprofen and applied ice with no significant relief of pain. She notes associated erythema and swelling to the area. Pt denies any ankle pain or any other injury and has no other complaints at this time.   The history is provided by the patient. No language interpreter was used.    Past Medical History:  Diagnosis Date  . Asthma    Exercise induced  . Endometriosis   . Hyperlipidemia   . Interstitial cystitis   . Kidney stone   . Migraine   . Multiple allergies   . 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 Back pain 09/11/2016  . Obesity 07/15/2015  . Upper airway cough syndrome 07/14/2015  . Cough variant asthma 07/14/2015  . Abnormal Pap smear of cervix 09/19/2014  . Breast lump on right side at 11 o'clock position 08/23/2014  . NEVUS, ATYPICAL 05/26/2009  . Cigarette smoker 05/26/2009  . FLANK PAIN, LEFT 04/07/2008  . NEPHROLITHIASIS, HX OF 10/19/2007  . Hyperlipidemia 07/30/2007  . Anxiety state 07/30/2007  . DEPRESSION 07/30/2007  . Migraine 07/30/2007  .  ALLERGIC RHINITIS 07/30/2007  . Asthma 07/30/2007  . INTERSTITIAL CYSTITIS 07/30/2007  . INSOMNIA 07/30/2007  . PERIPHERAL EDEMA 07/30/2007    Past Surgical History:  Procedure Laterality Date  . ABLATION    . CERVICAL BIOPSY  W/ LOOP ELECTRODE EXCISION    . CHOLECYSTECTOMY    . COLPOSCOPY    . CYSTOSTOMY W/ BLADDER BIOPSY    . KNEE SURGERY    . LEEP    . RHINOPLASTY    . TENDON REPAIR Left 07/26/2015   Procedure: REPAIR WITH RECONSTRUCTION LEFT THUMB;  Surgeon: Charlotte Crumb, MD;  Location: [place];  Service: Orthopedics;  Laterality: Left;  . TONSILLECTOMY    . TUBAL LIGATION      OB History    Gravida Para Term Preterm AB Living   5 3 3   2 3    SAB TAB Ectopic Multiple Live Births   2               Home Medications    Prior to Admission medications   Medication Sig Start Date End Date Taking? Authorizing Provider  albuterol (PROVENTIL HFA;VENTOLIN HFA) 108 (90 BASE) MCG/ACT inhaler Inhale 1-2 puffs into the lungs every 6 (six) hours as needed for wheezing or shortness of breath. 07/02/15   Golden Circle, FNP  aspirin-acetaminophen-caffeine (EXCEDRIN MIGRAINE) [phone] MG tablet Take 4 tablets by mouth every 6 (six) hours as needed.     Historical Provider, MD  busPIRone (BUSPAR) 10 MG tablet Take 1 tablet (10 mg total) by mouth 3 (three) times daily. 09/11/16   Golden Circle, FNP  diphenhydrAMINE (BENADRYL) 25 MG tablet Take 50 mg by mouth 4 (four) times daily.    Historical Provider, MD  doxycycline (VIBRAMYCIN) 100 MG capsule Take 1 capsule (100 mg total) by mouth 2 (two) times daily. 12/25/16   Kinnie Feil, PA-C  EPINEPHrine (EPIPEN 2-PAK) 0.3 mg/0.3 mL IJ SOAJ injection Inject 0.3 mg into the muscle once as needed (for severe allergic reaction).     Historical Provider, MD  ibuprofen (ADVIL,MOTRIN) 200 MG tablet Take 800 mg by mouth every 6 (six) hours as needed for headache, mild pain or moderate pain.     Historical Provider, MD  Ibuprofen-Famotidine  800-26.6 MG TABS Take 1 tablet by mouth 3 (three) times daily as needed. 09/11/16   Golden Circle, FNP  predniSONE (DELTASONE) 20 MG tablet Days 1-4: Take 3 tablets by mouth daily; Days 5-8: Take 2 tablets by mouth daily; Days 9-12: Take 1 tablet by mouth daily. 09/11/16   Golden Circle, FNP    Family History Family History  Problem Relation Age of Onset  . Heart disease Mother   . COPD Mother   . Diabetes Father   . Hypertension Father   . Stroke Father   . Heart disease Father   . Breast cancer Maternal Grandmother   . Cancer Maternal Grandfather     melanoma  . Heart disease Paternal Grandmother   . Stroke Paternal Grandmother   . Heart attack Paternal Grandmother   . Heart disease Paternal Grandfather   . Stroke Paternal Grandfather   . Heart attack Paternal Grandfather     Social History Social History  Substance Use Topics  . Smoking status: Current Every Day Smoker    Packs/day: 0.50    Years: 25.00    Types: Cigarettes  . Smokeless tobacco: Never Used  . Alcohol use Yes     Comment: rarely     Allergies   Bee venom; Flexeril [cyclobenzaprine]; Lavender oil; Robaxin [methocarbamol]; Amitriptyline; Entex; Singulair [montelukast sodium]; Etodolac; Iodine; Mushroom extract complex; and Shellfish allergy   Review of Systems Review of Systems  Musculoskeletal: Positive for arthralgias, joint swelling and myalgias.  Skin: Positive for color change.   Physical Exam Updated Vital Signs BP 123/85   Pulse 98   Temp 98.9 F (37.2 C) (Oral)   Resp 18   Ht 5\' 2"  (1.575 m)   Wt 156 lb 12.8 oz (71.1 kg)   SpO2 99%   BMI 28.68 kg/m   Physical Exam  Constitutional: She is oriented to person, place, and time. She appears well-developed and well-nourished. No distress.  HENT:  Head: Normocephalic and atraumatic.  Eyes: Conjunctivae are normal.  Cardiovascular: Normal rate.   Pulmonary/Chest: Effort normal.  Abdominal: She exhibits no distension.    Musculoskeletal: She exhibits tenderness.  Rt foot: great toe with tenderness mostly the MTP and IP joint, with associated swelling and faint ecchymosis.  No nail involvement, no gross deformity. Right second toe mildly tender to palpation. DP pulses palpable. Right ankle nontender  Neurological: She is alert and oriented to person, place, and time.  Skin: Skin is warm and dry.  Psychiatric: She has a normal mood and affect.  Nursing note and vitals reviewed.  ED Treatments / Results  DIAGNOSTIC STUDIES:  Oxygen Saturation is 99% on RA, normal by my interpretation.    COORDINATION OF CARE:  8:07PM Discussed treatment plan with pt at bedside and pt agreed to plan.   Labs (all labs ordered are listed, but only abnormal results are displayed) Labs Reviewed - No data to display  EKG  EKG Interpretation None       Radiology Dg Foot Complete Right  Result Date: 01/17/2017 CLINICAL DATA:  Kicked a steel beam this afternoon. Right foot pain along the first metatarsal. Initial encounter. EXAM: RIGHT FOOT COMPLETE - 3+ VIEW COMPARISON:  07/04/2014 FINDINGS: Lucency at the lateral base of the great toe distal phalanx is subtle but likely a nondisplaced fracture in this setting. No findings along the reportedly symptomatic first metatarsal. No dislocation. IMPRESSION: Assuming focal tenderness, there is a nondisplaced fracture at the base of the great toe distal phalanx. Electronically Signed   By: Monte Fantasia M.D.   On: 01/17/2017 20:46    Procedures Procedures (including critical care time)  Medications Ordered in ED Medications - No data to display   Initial Impression / Assessment and Plan / ED Course  I have reviewed the triage vital signs and the nursing notes.  Pertinent labs & imaging results that were available during my care of the patient were reviewed by me and considered in my medical decision making (see chart for details).     BP 123/85   Pulse 98   Temp 98.9  F (37.2 C) (Oral)   Resp 18   Ht 5\' 2"  (1.575 m)   Wt 71.1 kg   SpO2 99%   BMI 28.68 kg/m    Final Clinical Impressions(s) / ED Diagnoses   Final diagnoses:  Closed fracture of distal phalanx of right great toe, initial encounter    New Prescriptions New Prescriptions   HYDROCODONE-ACETAMINOPHEN (NORCO/VICODIN) 5-325 MG TABLET    Take 1 tablet by mouth every 6 (six) hours as needed for moderate pain or severe pain.   IBUPROFEN (ADVIL,MOTRIN) 800 MG TABLET    Take 1 tablet (800 mg total) by mouth 3 (three) times daily.    I personally performed the services described in this documentation, which was scribed in my presence. The recorded information has been reviewed and is accurate.   9:03 PM Pt had a mechanical injury, striking her R great toe against steel beam. xrayof R foot demonstrates a nondispalced fx at the base of the great toe distal phalanx.  This is a closed injury. Toe was buddy taped, post op shoe given, care instruction provided and ortho referral given as needed.    [name], PA-C 01/18/17 [place], DO 01/18/17 2301

## 2017-01-17 NOTE — ED Triage Notes (Signed)
Pt c/o right foot pain present from great toe into joint; pt was running after her dogs and accidentally kicked a metal pole; redness noted to great toe joint; pt states she cannot move her toes

## 2017-01-17 NOTE — ED Notes (Signed)
Pain due to kicking a steel beam. Pain hasn't gone away after an hour.

## 2017-06-09 ENCOUNTER — Encounter (INDEPENDENT_AMBULATORY_CARE_PROVIDER_SITE_OTHER): Payer: Self-pay | Admitting: Orthopaedic Surgery

## 2017-06-09 ENCOUNTER — Ambulatory Visit (INDEPENDENT_AMBULATORY_CARE_PROVIDER_SITE_OTHER): Payer: Worker's Compensation | Admitting: Orthopaedic Surgery

## 2017-06-09 DIAGNOSIS — M25511 Pain in right shoulder: Secondary | ICD-10-CM

## 2017-06-09 NOTE — Progress Notes (Signed)
Office Visit Note   Patient: Brandi Morris           Date of Birth: 16-Apr-1976           MRN: [id number] Visit Date: 06/09/2017              Requested by: [address], [address], [place] [address] PCP: Golden Circle, FNP   Assessment & Plan: Visit Diagnoses:  1. Acute pain of right shoulder     Plan: Overall her shoulder seems to be very inflamed. I don't find any focal deficits. We agreed to try another subacromial injection to see if this will give her better results. I will like her to follow up with me in 2 weeks to see how she is doing. If not better we should get an MRI. Out of work until then. Total face to face encounter time was greater than 45 minutes and over half of this time was spent in counseling and/or coordination of care.  Follow-Up Instructions: Return in about 2 weeks (around 06/23/2017).   Orders:  No orders of the defined types were placed in this encounter.  No orders of the defined types were placed in this encounter.     Procedures: Large Joint Inj Date/Time: 06/09/2017 11:34 AM Performed by: Leandrew Koyanagi Authorized by: Leandrew Koyanagi   Consent Given by:  Patient Timeout: prior to procedure the correct patient, procedure, and site was verified   Indications:  Pain Location:  Shoulder Site:  R subacromial bursa Prep: patient was prepped and draped in usual sterile fashion   Needle Size:  22 G Approach:  Posterior Ultrasound Guidance: No   Fluoroscopic Guidance: No       Clinical Data: No additional findings.   Subjective: Chief Complaint  Patient presents with  . Right Shoulder - Pain, Injury    Patient is a 41 year old female who has had right shoulder pain since June 29. 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 She's had one cortisone injection and has done physical therapy without any relief. She continues to have severe pain that feels like a pulling  sensation. Denies any numbness or tingling or radicular.    Review of Systems  Constitutional: Negative.   HENT: Negative.   Eyes: Negative.   Respiratory: Negative.   Cardiovascular: Negative.   Endocrine: Negative.   Musculoskeletal: Negative.   Neurological: Negative.   Hematological: Negative.   Psychiatric/Behavioral: Negative.   All other systems reviewed and are negative.    Objective: Vital Signs: There were no vitals taken for this visit.  Physical Exam  Constitutional: She is oriented to person, place, and time. She appears well-developed and well-nourished.  HENT:  Head: Normocephalic and atraumatic.  Eyes: EOM are normal.  Neck: Neck supple.  Pulmonary/Chest: Effort normal.  Abdominal: Soft.  Neurological: She is alert and oriented to person, place, and time.  Skin: Skin is warm. Capillary refill takes less than 2 seconds.  Psychiatric: She has a normal mood and affect. Her behavior is normal. Judgment and thought content normal.  Nursing note and vitals reviewed.   Ortho Exam Right shoulder exam shows significant limitation guarding secondary to pain. Rotator cuff is grossly intact. Positive impingement signs. Acromioclavicular joint is tender. Positive cross adduction. Specialty Comments:  No specialty comments available.  Imaging: No results found.   PMFS History: Patient Active Problem List   Diagnosis Date Noted  .  Back pain 09/11/2016  . Obesity 07/15/2015  . Upper airway cough syndrome 07/14/2015  . Cough variant asthma 07/14/2015  . Abnormal Pap smear of cervix 09/19/2014  . Breast lump on right side at 11 o'clock position 08/23/2014  . NEVUS, ATYPICAL 05/26/2009  . Cigarette smoker 05/26/2009  . FLANK PAIN, LEFT 04/07/2008  . NEPHROLITHIASIS, HX OF 10/19/2007  . Hyperlipidemia 07/30/2007  . Anxiety state 07/30/2007  . DEPRESSION 07/30/2007  . Migraine 07/30/2007  . ALLERGIC RHINITIS 07/30/2007  . Asthma 07/30/2007  . INTERSTITIAL  CYSTITIS 07/30/2007  . INSOMNIA 07/30/2007  . PERIPHERAL EDEMA 07/30/2007   Past Medical History:  Diagnosis Date  . Asthma    Exercise induced  . Endometriosis   . Hyperlipidemia   . Interstitial cystitis   . Kidney stone   . Migraine   . Multiple allergies   . 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 Heart disease Mother   . COPD Mother   . Diabetes Father   . Hypertension Father   . Stroke Father   . Heart disease Father   . Breast cancer Maternal Grandmother   . Cancer Maternal Grandfather        melanoma  . Heart disease Paternal Grandmother   . Stroke Paternal Grandmother   . Heart attack Paternal Grandmother   . Heart disease Paternal Grandfather   . Stroke Paternal Grandfather   . Heart attack Paternal Grandfather     Past Surgical History:  Procedure Laterality Date  . ABLATION    . CERVICAL BIOPSY  W/ LOOP ELECTRODE EXCISION    . CHOLECYSTECTOMY    . COLPOSCOPY    . CYSTOSTOMY W/ BLADDER BIOPSY    . KNEE SURGERY    . LEEP    . RHINOPLASTY    . TENDON REPAIR Left 07/26/2015   Procedure: REPAIR WITH RECONSTRUCTION LEFT THUMB;  Surgeon: Charlotte Crumb, MD;  Location: [place];  Service: Orthopedics;  Laterality: Left;  . TONSILLECTOMY    . TUBAL LIGATION     Social History   Occupational History  . Billing Specialist    Social History Main Topics  . Smoking status: Current Every Day Smoker    Packs/day: 0.50    Years: 25.00    Types: Cigarettes  . Smokeless tobacco: Never Used  . Alcohol use Yes     Comment: rarely  . Drug use: No  . Sexual activity: Yes    Birth control/ protection: Surgical

## 2017-06-23 ENCOUNTER — Ambulatory Visit (INDEPENDENT_AMBULATORY_CARE_PROVIDER_SITE_OTHER): Payer: Self-pay | Admitting: Orthopaedic Surgery

## 2017-09-04 ENCOUNTER — Encounter (INDEPENDENT_AMBULATORY_CARE_PROVIDER_SITE_OTHER): Payer: Self-pay | Admitting: Orthopaedic Surgery

## 2017-09-04 ENCOUNTER — Ambulatory Visit (INDEPENDENT_AMBULATORY_CARE_PROVIDER_SITE_OTHER): Payer: Self-pay | Admitting: Orthopaedic Surgery

## 2017-09-04 ENCOUNTER — Telehealth (INDEPENDENT_AMBULATORY_CARE_PROVIDER_SITE_OTHER): Payer: Self-pay | Admitting: Orthopaedic Surgery

## 2017-09-04 DIAGNOSIS — M25511 Pain in right shoulder: Secondary | ICD-10-CM

## 2017-09-04 MED ORDER — MELOXICAM 7.5 MG PO TABS: 7.5000 mg | ORAL_TABLET | Freq: Two times a day (BID) | ORAL | 2 refills | Status: DC | PRN

## 2017-09-04 MED ORDER — TRAMADOL HCL 50 MG PO TABS
50.0000 mg | ORAL_TABLET | Freq: Three times a day (TID) | ORAL | 2 refills | Status: DC | PRN
Start: 2017-09-04 — End: 2018-08-18

## 2017-09-04 MED ORDER — IBUPROFEN 800 MG PO TABS
800.0000 mg | ORAL_TABLET | Freq: Three times a day (TID) | ORAL | 3 refills | Status: DC | PRN
Start: 2017-09-04 — End: 2018-08-18

## 2017-09-04 NOTE — Telephone Encounter (Signed)
Are we able to do this? Will she need to sign something? Do I send to tammy ?

## 2017-09-04 NOTE — Telephone Encounter (Signed)
See message below °

## 2017-09-04 NOTE — Telephone Encounter (Signed)
Patient would like the office notes from todays visit emailed to her attorney, since her workers comp claim was denied and she is taking them to court. CB# [phone]  Email: [email or web address]

## 2017-09-04 NOTE — Progress Notes (Signed)
Office Visit Note   Patient: Brandi Morris           Date of Birth: 1976-02-03           MRN: [id number] Visit Date: 09/04/2017              Requested by: [address], [place], [place] [address] PCP: [facility], FNP   Assessment & Plan: Visit Diagnoses:  1. Acute pain of right shoulder     Plan: At this point I would recommend MRI of the right shoulder given failure of conservative treatment. She has had minimal relief from previous injections. Work note provided today. Prescription for tramadol, meloxicam, Motrin.  Follow-Up Instructions: Return if symptoms worsen or fail to improve.   Orders:  No orders of the defined types were placed in this encounter.  Meds ordered this encounter  Medications  . traMADol (ULTRAM) 50 MG tablet    Sig: Take 1-2 tablets (50-100 mg total) by mouth 3 (three) times daily as needed.    Dispense:  30 tablet    Refill:  2  . meloxicam (MOBIC) 7.5 MG tablet    Sig: Take 1 tablet (7.5 mg total) by mouth 2 (two) times daily as needed for pain.    Dispense:  30 tablet    Refill:  2  . ibuprofen (ADVIL,MOTRIN) 800 MG tablet    Sig: Take 1 tablet (800 mg total) by mouth every 8 (eight) hours as needed.    Dispense:  60 tablet    Refill:  3      Procedures: No procedures performed   Clinical Data: No additional findings.   Subjective: Chief Complaint  Patient presents with  . Right Shoulder - Pain, Follow-up    Patient follows up today for continued right shoulder pain. She continues to have significant pain. The previous injection worked for several days. She denies any new complaints.    Review of Systems   Objective: Vital Signs: There were no vitals taken for this visit.  Physical Exam  Ortho Exam Right shoulder exam is stable. She has very limited range of motion and function secondary to pain and guarding. Specialty Comments:  No specialty comments available.  Imaging: No results  found.   PMFS History: Patient Active Problem List   Diagnosis Date Noted  . Back pain 09/11/2016  . Obesity 07/15/2015  . Upper airway cough syndrome 07/14/2015  . Cough variant asthma 07/14/2015  . Abnormal Pap smear of cervix 09/19/2014  . Breast lump on right side at 11 o'clock position 08/23/2014  . NEVUS, ATYPICAL 05/26/2009  . Cigarette smoker 05/26/2009  . FLANK PAIN, LEFT 04/07/2008  . NEPHROLITHIASIS, HX OF 10/19/2007  . Hyperlipidemia 07/30/2007  . Anxiety state 07/30/2007  . DEPRESSION 07/30/2007  . Migraine 07/30/2007  . ALLERGIC RHINITIS 07/30/2007  . Asthma 07/30/2007  . INTERSTITIAL CYSTITIS 07/30/2007  . INSOMNIA 07/30/2007  . PERIPHERAL EDEMA 07/30/2007   Past Medical History:  Diagnosis Date  . Asthma    Exercise induced  . Endometriosis   . Hyperlipidemia   . Interstitial cystitis   . Kidney stone   . Migraine   . Multiple allergies   . 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 Heart disease Mother   . COPD Mother   . Diabetes Father   . Hypertension Father   . Stroke Father   .  Heart disease Father   . Breast cancer Maternal Grandmother   . Cancer Maternal Grandfather        melanoma  . Heart disease Paternal Grandmother   . Stroke Paternal Grandmother   . Heart attack Paternal Grandmother   . Heart disease Paternal Grandfather   . Stroke Paternal Grandfather   . Heart attack Paternal Grandfather     Past Surgical History:  Procedure Laterality Date  . ABLATION    . CERVICAL BIOPSY  W/ LOOP ELECTRODE EXCISION    . CHOLECYSTECTOMY    . COLPOSCOPY    . CYSTOSTOMY W/ BLADDER BIOPSY    . KNEE SURGERY    . LEEP    . RHINOPLASTY    . TENDON REPAIR Left 07/26/2015   Procedure: REPAIR WITH RECONSTRUCTION LEFT THUMB;  Surgeon: Charlotte Crumb, MD;  Location: [facility];  Service: Orthopedics;  Laterality: Left;  . TONSILLECTOMY    . TUBAL LIGATION     Social History    Occupational History  . Billing Specialist    Social History Main Topics  . Smoking status: Current Every Day Smoker    Packs/day: 0.50    Years: 25.00    Types: Cigarettes  . Smokeless tobacco: Never Used  . Alcohol use Yes     Comment: rarely  . Drug use: No  . Sexual activity: Yes    Birth control/ protection: Surgical

## 2017-09-04 NOTE — Telephone Encounter (Signed)
Yes I would forward to Tammy and she can handle this.

## 2017-09-05 NOTE — Telephone Encounter (Signed)
09/04/2017 OV NOTE FAXED

## 2017-09-10 ENCOUNTER — Telehealth (INDEPENDENT_AMBULATORY_CARE_PROVIDER_SITE_OTHER): Payer: Self-pay | Admitting: Orthopaedic Surgery

## 2017-09-10 NOTE — Telephone Encounter (Signed)
Fax number [phone]

## 2017-09-23 ENCOUNTER — Other Ambulatory Visit (INDEPENDENT_AMBULATORY_CARE_PROVIDER_SITE_OTHER): Payer: Self-pay | Admitting: Orthopaedic Surgery

## 2017-09-23 DIAGNOSIS — M25511 Pain in right shoulder: Secondary | ICD-10-CM

## 2017-09-30 ENCOUNTER — Telehealth (INDEPENDENT_AMBULATORY_CARE_PROVIDER_SITE_OTHER): Payer: Self-pay | Admitting: *Deleted

## 2017-09-30 NOTE — Telephone Encounter (Signed)
[facility], music stating pt  is not having exam cannot afford it/ [facility]wp

## 2017-09-30 NOTE — Telephone Encounter (Signed)
FYI

## 2017-11-05 ENCOUNTER — Telehealth (INDEPENDENT_AMBULATORY_CARE_PROVIDER_SITE_OTHER): Payer: Self-pay | Admitting: Orthopaedic Surgery

## 2017-11-06 ENCOUNTER — Ambulatory Visit
Admission: RE | Admit: 2017-11-06 | Discharge: 2017-11-06 | Disposition: A | Discharge status: Home / Self Care | Payer: No Typology Code available for payment source | Source: Ambulatory Visit | Attending: Orthopaedic Surgery | Admitting: Orthopaedic Surgery

## 2017-11-06 DIAGNOSIS — M25511 Pain in right shoulder: Secondary | ICD-10-CM

## 2017-11-06 NOTE — Telephone Encounter (Signed)
ERROR

## 2017-11-13 ENCOUNTER — Telehealth (INDEPENDENT_AMBULATORY_CARE_PROVIDER_SITE_OTHER): Payer: Self-pay | Admitting: Orthopaedic Surgery

## 2017-11-13 NOTE — Telephone Encounter (Signed)
1. Moderate tendinosis of the supraspinatus tendon with a small partial-thickness bursal surface tear. (Essentially wear and tear of the rotator cuff tendon with a small partial thickness tear.) 2. Mild tendinosis of the infraspinatus tendon.  (wear and tear) 3. Moderate tendinosis of the intraarticular portion of the long head of the biceps tendon. (wear and tear)

## 2017-11-13 NOTE — Telephone Encounter (Signed)
Patient wants a call back with MRI results, she is not able to come in due to [facility] being denied. Please advise [phone]

## 2017-11-13 NOTE — Telephone Encounter (Signed)
See message below °

## 2017-11-14 NOTE — Telephone Encounter (Signed)
Would like a Callback from you.

## 2017-11-16 NOTE — Telephone Encounter (Signed)
Called her and no one answered

## 2018-02-10 ENCOUNTER — Other Ambulatory Visit (HOSPITAL_COMMUNITY): Payer: Self-pay | Admitting: *Deleted

## 2018-02-10 DIAGNOSIS — N6310 Unspecified lump in the right breast, unspecified quadrant: Secondary | ICD-10-CM

## 2018-03-05 ENCOUNTER — Ambulatory Visit: Payer: Self-pay

## 2018-03-05 ENCOUNTER — Inpatient Hospital Stay: Admission: RE | Admit: 2018-03-05 | Payer: Self-pay | Source: Ambulatory Visit

## 2018-03-05 ENCOUNTER — Ambulatory Visit (HOSPITAL_COMMUNITY): Payer: Self-pay

## 2018-04-22 ENCOUNTER — Other Ambulatory Visit (INDEPENDENT_AMBULATORY_CARE_PROVIDER_SITE_OTHER): Payer: Self-pay | Admitting: Orthopaedic Surgery

## 2018-04-22 NOTE — Telephone Encounter (Signed)
No.  I haven't seen her in a while.

## 2018-04-23 ENCOUNTER — Other Ambulatory Visit: Payer: Self-pay | Admitting: *Deleted

## 2018-04-23 NOTE — Telephone Encounter (Signed)
Med was denied pt has not seen greg since 2017... No longer pt PCP will need to establish w/new PCP for refills.Marland KitchenJohny Chess

## 2018-04-28 ENCOUNTER — Other Ambulatory Visit: Payer: Self-pay | Admitting: *Deleted

## 2018-08-18 ENCOUNTER — Other Ambulatory Visit: Payer: Self-pay | Admitting: Internal Medicine

## 2018-08-18 ENCOUNTER — Encounter: Payer: Self-pay | Admitting: Internal Medicine

## 2018-08-18 ENCOUNTER — Ambulatory Visit: Payer: Managed Care, Other (non HMO) | Admitting: Internal Medicine

## 2018-08-18 ENCOUNTER — Other Ambulatory Visit (HOSPITAL_COMMUNITY)
Admission: RE | Admit: 2018-08-18 | Discharge: 2018-08-18 | Disposition: A | Discharge status: Home / Self Care | Payer: Managed Care, Other (non HMO) | Source: Ambulatory Visit | Attending: Internal Medicine | Admitting: Internal Medicine

## 2018-08-18 VITALS — BP 118/78 | HR 100 | Temp 98.4°F | Wt 182.0 lb

## 2018-08-18 DIAGNOSIS — Z124 Encounter for screening for malignant neoplasm of cervix: Secondary | ICD-10-CM | POA: Diagnosis present

## 2018-08-18 DIAGNOSIS — N939 Abnormal uterine and vaginal bleeding, unspecified: Secondary | ICD-10-CM

## 2018-08-18 NOTE — Patient Instructions (Signed)

## 2018-08-18 NOTE — Progress Notes (Signed)
Subjective:    Patient ID: Brandi Morris, female    DOB: [birth year], 42 y.o.   MRN: [id number]  HPI  Pt presents to the clinic today with c/o vaginal spotting. She noticed this 4 days ago. She reports the blood is bright red. She has associated RLQ pain, which is intermittent. She reports nausea but denies vomiting or diarrhea. She is s/p uterine ablation 12 years ago. She had a colposcopy and LEEP 3 years ago. Her last pap smear was 06/2016- normal. She is sexually active with 1 partner, no concern for STD.  Review of Systems      Past Medical History:  Diagnosis Date  . Asthma    Exercise induced  . Endometriosis   . Hyperlipidemia   . Interstitial cystitis   . Kidney stone   . Migraine   . Multiple allergies   . 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 albuterol (PROVENTIL HFA;VENTOLIN HFA) 108 (90 BASE) MCG/ACT inhaler Inhale 1-2 puffs into the lungs every 6 (six) hours as needed for wheezing or shortness of breath. 18 g 2  . aspirin-acetaminophen-caffeine (EXCEDRIN MIGRAINE) 130-865-78 MG tablet Take 4 tablets by mouth every 6 (six) hours as needed.     . busPIRone (BUSPAR) 10 MG tablet Take 1 tablet (10 mg total) by mouth 3 (three) times daily. 90 tablet 2  . diphenhydrAMINE (BENADRYL) 25 MG tablet Take 50 mg by mouth 4 (four) times daily.    Marland Kitchen doxycycline (VIBRAMYCIN) 100 MG capsule Take 1 capsule (100 mg total) by mouth 2 (two) times daily. 20 capsule 0  . EPINEPHrine (EPIPEN 2-PAK) 0.3 mg/0.3 mL IJ SOAJ injection Inject 0.3 mg into the muscle once as needed (for severe allergic reaction).     Marland Kitchen HYDROcodone-acetaminophen (NORCO/VICODIN) 5-325 MG tablet Take 1 tablet by mouth every 6 (six) hours as needed for moderate pain or severe pain. 6 tablet 0  . ibuprofen (ADVIL,MOTRIN) 800 MG tablet Take 1 tablet (800 mg total) by mouth 3 (three) times daily. 21 tablet 0  . ibuprofen (ADVIL,MOTRIN)  800 MG tablet Take 1 tablet (800 mg total) by mouth every 8 (eight) hours as needed. 60 tablet 3  . Ibuprofen-Famotidine 800-26.6 MG TABS Take 1 tablet by mouth 3 (three) times daily as needed. 90 tablet 1  . meloxicam (MOBIC) 7.5 MG tablet Take 1 tablet (7.5 mg total) by mouth 2 (two) times daily as needed for pain. 30 tablet 2  . predniSONE (DELTASONE) 20 MG tablet Days 1-4: Take 3 tablets by mouth daily; Days 5-8: Take 2 tablets by mouth daily; Days 9-12: Take 1 tablet by mouth daily. 24 tablet 0  . traMADol (ULTRAM) 50 MG tablet Take by mouth every 6 (six) hours as needed.    . traMADol (ULTRAM) 50 MG tablet Take 1-2 tablets (50-100 mg total) by mouth 3 (three) times daily as needed. 30 tablet 2   No current facility-administered medications for this visit.     Allergies  Allergen Reactions  . Bee Venom Anaphylaxis  . Flexeril [Cyclobenzaprine] Other (See Comments)    Reaction:  Makes pt violent   . Lavender Oil Anaphylaxis and Other (See Comments)    Pt states that her lungs and sinuses fill-up with fluid.    . Robaxin [Methocarbamol] Other (See Comments)    Reaction:  Makes pt violent   . Amitriptyline Other (See Comments)    Reaction:  Hallucinations   . Entex Other (See Comments)    Reaction:  Dizziness   . Singulair [Montelukast Sodium] Other (See Comments)    Reaction:  Agitation  Pt states that her sinuses fill-up with fluid.    . Etodolac Other (See Comments)    Reaction:  Dizziness   . Iodine Hives  . Mushroom Extract Complex Hives  . Shellfish Allergy Hives    Family History  Problem Relation Age of Onset  . Heart disease Mother   . COPD Mother   . Diabetes Father   . Hypertension Father   . Stroke Father   . Heart disease Father   . Breast cancer Maternal Grandmother   . Cancer Maternal Grandfather        melanoma  . Heart disease Paternal Grandmother   . Stroke Paternal Grandmother   . Heart attack Paternal Grandmother   . Heart disease Paternal  Grandfather   . Stroke Paternal Grandfather   . Heart attack Paternal Grandfather     Social History   Socioeconomic History  . Marital status: Single    Spouse name: Not on file  . Number of children: 3  . Years of education: 87  . Highest education level: Not on file  Occupational History  . Occupation: Architect  Social Needs  . Financial resource strain: Not on file  . Food insecurity:    Worry: Not on file    Inability: Not on file  . Transportation needs:    Medical: Not on file    Non-medical: Not on file  Tobacco Use  . Smoking status: Current Every Day Smoker    Packs/day: 0.50    Years: 25.00    Pack years: 12.50    Types: Cigarettes  . Smokeless tobacco: Never Used  Substance and Sexual Activity  . Alcohol use: Yes    Comment: rarely  . Drug use: No  . Sexual activity: Yes    Birth control/protection: Surgical  Lifestyle  . Physical activity:    Days per week: Not on file    Minutes per session: Not on file  . Stress: Not on file  Relationships  . Social connections:    Talks on phone: Not on file    Gets together: Not on file    Attends religious service: Not on file    Active member of club or organization: Not on file    Attends meetings of clubs or organizations: Not on file    Relationship status: Not on file  . Intimate partner violence:    Fear of current or ex partner: Not on file    Emotionally abused: Not on file    Physically abused: Not on file    Forced sexual activity: Not on file  Other Topics Concern  . Not on file  Social History Narrative   Fun: Fish and hunt   Denies abuse and feels safe at home.      Constitutional: Denies fever, malaise, fatigue, headache or abrupt weight changes.  Gastrointestinal: Pt reports RLQ pain. Denies bloating, constipation, diarrhea or blood in the stool.  GU: Pt reports vaginal spotting. Denies urgency, frequency, pain with urination, burning sensation, blood in urine, odor or  discharge.  No other specific complaints in a complete review of systems (except as listed in HPI above).  Objective:   Physical Exam   BP 118/78   Pulse 100   Temp 98.4 F (36.9 C) (Oral)   Wt 182 lb (82.6 kg)  SpO2 97%   BMI 33.29 kg/m  Wt Readings from Last 3 Encounters:  08/18/18 182 lb (82.6 kg)  01/17/17 156 lb 12.8 oz (71.1 kg)  09/11/16 163 lb (73.9 kg)    General: Appears her stated age, well developed, well nourished in NAD. Abdomen: Soft and nontender. Normal bowel sounds. No distention or masses noted. Liver, spleen and kidneys non palpable. Pelvic:  Normal female anatomy. Cervix with changes noted at 7 oclock. No bleeding or discharge noted. Adnexa non palpable.  BMET    Component Value Date/Time   NA 136 06/09/2016 1340   K 4.3 06/09/2016 1340   CL 106 06/09/2016 1340   CO2 24 06/09/2016 1340   GLUCOSE 134 (H) 06/09/2016 1340   GLUCOSE 93 10/17/2006 1127   BUN 12 06/09/2016 1340   CREATININE 0.74 06/09/2016 1340   CALCIUM 9.0 06/09/2016 1340   GFRNONAA >60 06/09/2016 1340   GFRAA >60 06/09/2016 1340    Lipid Panel     Component Value Date/Time   CHOL 220 (HH) 10/17/2006 1127   TRIG 94 10/17/2006 1127   HDL 40.6 10/17/2006 1127   CHOLHDL 5.4 CALC 10/17/2006 1127   VLDL 19 10/17/2006 1127    CBC    Component Value Date/Time   WBC 9.9 06/09/2016 1340   RBC 4.93 06/09/2016 1340   HGB 15.1 (H) 06/09/2016 1340   HCT 44.6 06/09/2016 1340   PLT 287 06/09/2016 1340   MCV 90.5 06/09/2016 1340   MCH 30.6 06/09/2016 1340   MCHC 33.9 06/09/2016 1340   RDW 13.8 06/09/2016 1340   LYMPHSABS 2.0 03/08/2014 1120   MONOABS 0.7 03/08/2014 1120   EOSABS 0.5 03/08/2014 1120   BASOSABS 0.0 03/08/2014 1120    Hgb A1C No results found for: HGBA1C         Assessment & Plan:  Abnormal Uterine Bleeding:  ? Hemorrhagic cyst Pap smear today- she declines STD screening TSH, FSH and LH today Will obtain pelvic and transvaginal ultrasound  Will  follow up after labs and imaging. Return precautions discussed Webb Silversmith, NP

## 2018-08-19 LAB — FOLLICLE STIMULATING HORMONE: FSH: 8 m[IU]/mL

## 2018-08-19 LAB — TSH: TSH: 0.94 u[IU]/mL (ref 0.35–4.50)

## 2018-08-19 LAB — LUTEINIZING HORMONE: LH: 7.78 m[IU]/mL

## 2018-08-20 LAB — CYTOLOGY - PAP
Diagnosis: NEGATIVE
HPV: NOT DETECTED

## 2018-08-24 ENCOUNTER — Ambulatory Visit
Admission: RE | Admit: 2018-08-24 | Discharge: 2018-08-24 | Disposition: A | Discharge status: Home / Self Care | Payer: Managed Care, Other (non HMO) | Source: Ambulatory Visit | Attending: Internal Medicine | Admitting: Internal Medicine

## 2018-08-24 DIAGNOSIS — Z124 Encounter for screening for malignant neoplasm of cervix: Secondary | ICD-10-CM

## 2018-08-24 DIAGNOSIS — N939 Abnormal uterine and vaginal bleeding, unspecified: Secondary | ICD-10-CM

## 2018-08-25 ENCOUNTER — Telehealth: Payer: Self-pay | Admitting: Internal Medicine

## 2018-08-25 NOTE — Telephone Encounter (Signed)
See result note.  

## 2018-08-25 NOTE — Telephone Encounter (Signed)
Copied from [place] [phone]. Topic: General - Other >> Aug 25, 2018  4:06 PM Keene Breath wrote: Reason for CRM: Patient called to request the results from her test at [facility] imaging.  CB# [phone]

## 2018-08-26 NOTE — Telephone Encounter (Signed)
Pt called back to speak with [facility]; called warm transferred to [facility].

## 2018-08-26 NOTE — Telephone Encounter (Signed)
Pt states she is returning a call to [facility] about results.  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

## 2018-09-24 ENCOUNTER — Encounter: Payer: Managed Care, Other (non HMO) | Admitting: Internal Medicine

## 2018-09-24 DIAGNOSIS — Z0289 Encounter for other administrative examinations: Secondary | ICD-10-CM

## 2018-10-19 ENCOUNTER — Emergency Department (HOSPITAL_COMMUNITY): Payer: Managed Care, Other (non HMO)

## 2018-10-19 ENCOUNTER — Encounter (HOSPITAL_COMMUNITY): Payer: Self-pay

## 2018-10-19 ENCOUNTER — Other Ambulatory Visit: Payer: Self-pay

## 2018-10-19 ENCOUNTER — Emergency Department (HOSPITAL_COMMUNITY)
Admission: EM | Admit: 2018-10-19 | Discharge: 2018-10-19 | Disposition: A | Discharge status: Home / Self Care | Payer: Managed Care, Other (non HMO) | Attending: Emergency Medicine | Admitting: Emergency Medicine

## 2018-10-19 DIAGNOSIS — Z7982 Long term (current) use of aspirin: Secondary | ICD-10-CM | POA: Diagnosis not present

## 2018-10-19 DIAGNOSIS — R112 Nausea with vomiting, unspecified: Secondary | ICD-10-CM | POA: Diagnosis not present

## 2018-10-19 DIAGNOSIS — F17210 Nicotine dependence, cigarettes, uncomplicated: Secondary | ICD-10-CM | POA: Insufficient documentation

## 2018-10-19 DIAGNOSIS — N898 Other specified noninflammatory disorders of vagina: Secondary | ICD-10-CM | POA: Insufficient documentation

## 2018-10-19 DIAGNOSIS — J45909 Unspecified asthma, uncomplicated: Secondary | ICD-10-CM | POA: Diagnosis not present

## 2018-10-19 DIAGNOSIS — R1032 Left lower quadrant pain: Secondary | ICD-10-CM | POA: Diagnosis present

## 2018-10-19 DIAGNOSIS — R300 Dysuria: Secondary | ICD-10-CM | POA: Insufficient documentation

## 2018-10-19 DIAGNOSIS — N939 Abnormal uterine and vaginal bleeding, unspecified: Secondary | ICD-10-CM | POA: Diagnosis not present

## 2018-10-19 DIAGNOSIS — K5900 Constipation, unspecified: Secondary | ICD-10-CM | POA: Diagnosis not present

## 2018-10-19 DIAGNOSIS — D3501 Benign neoplasm of right adrenal gland: Secondary | ICD-10-CM | POA: Insufficient documentation

## 2018-10-19 DIAGNOSIS — Z79899 Other long term (current) drug therapy: Secondary | ICD-10-CM | POA: Diagnosis not present

## 2018-10-19 LAB — CBC
HCT: 43.4 % (ref 36.0–46.0)
Hemoglobin: 14.3 g/dL (ref 12.0–15.0)
MCH: 30 pg (ref 26.0–34.0)
MCHC: 32.9 g/dL (ref 30.0–36.0)
MCV: 91 fL (ref 80.0–100.0)
NRBC: 0 % (ref 0.0–0.2)
PLATELETS: 284 10*3/uL (ref 150–400)
RBC: 4.77 MIL/uL (ref 3.87–5.11)
RDW: 12.6 % (ref 11.5–15.5)
WBC: 11.3 10*3/uL — ABNORMAL HIGH (ref 4.0–10.5)

## 2018-10-19 LAB — WET PREP, GENITAL
Sperm: NONE SEEN
Trich, Wet Prep: NONE SEEN
Yeast Wet Prep HPF POC: NONE SEEN

## 2018-10-19 LAB — COMPREHENSIVE METABOLIC PANEL
ALT: 11 U/L (ref 0–44)
AST: 13 U/L — ABNORMAL LOW (ref 15–41)
Albumin: 3.8 g/dL (ref 3.5–5.0)
Alkaline Phosphatase: 54 U/L (ref 38–126)
Anion gap: 8 (ref 5–15)
BUN: 10 mg/dL (ref 6–20)
CO2: 24 mmol/L (ref 22–32)
Calcium: 8.6 mg/dL — ABNORMAL LOW (ref 8.9–10.3)
Chloride: 108 mmol/L (ref 98–111)
Creatinine, Ser: 0.68 mg/dL (ref 0.44–1.00)
GFR calc non Af Amer: >60 mL/min (ref >60)
Glucose, Bld: 107 mg/dL — ABNORMAL HIGH (ref 70–99)
Potassium: 3.8 mmol/L (ref 3.5–5.1)
Sodium: 140 mmol/L (ref 135–145)
Total Bilirubin: 0.5 mg/dL (ref 0.3–1.2)
Total Protein: 6.4 g/dL — ABNORMAL LOW (ref 6.5–8.1)

## 2018-10-19 LAB — URINALYSIS, ROUTINE W REFLEX MICROSCOPIC
Bilirubin Urine: NEGATIVE
Glucose, UA: NEGATIVE mg/dL
Hgb urine dipstick: NEGATIVE
Ketones, ur: NEGATIVE mg/dL
Leukocytes, UA: NEGATIVE
Nitrite: NEGATIVE
Protein, ur: NEGATIVE mg/dL
Specific Gravity, Urine: 1.01 (ref 1.005–1.030)
pH: 6 (ref 5.0–8.0)

## 2018-10-19 LAB — I-STAT BETA HCG BLOOD, ED (MC, WL, AP ONLY): I-stat hCG, quantitative: <5 m[IU]/mL (ref <5)

## 2018-10-19 LAB — LIPASE, BLOOD: Lipase: 25 U/L (ref 11–51)

## 2018-10-19 MED ORDER — ONDANSETRON 4 MG PO TBDP: 4.0000 mg | ORAL_TABLET | Freq: Three times a day (TID) | ORAL | 0 refills | Status: DC | PRN

## 2018-10-19 MED ORDER — ONDANSETRON HCL 4 MG/2ML IJ SOLN
4.0000 mg | Freq: Once | INTRAMUSCULAR | Status: AC
  Administered 2018-10-19: 4 mg via INTRAVENOUS
  Filled 2018-10-19: qty 2

## 2018-10-19 MED ORDER — DOCUSATE SODIUM 100 MG PO CAPS: 100.0000 mg | ORAL_CAPSULE | Freq: Two times a day (BID) | ORAL | 0 refills | Status: DC

## 2018-10-19 MED ORDER — IOPAMIDOL (ISOVUE-300) INJECTION 61%
INTRAVENOUS | Status: AC
  Filled 2018-10-19: qty 100

## 2018-10-19 MED ORDER — MORPHINE SULFATE (PF) 4 MG/ML IV SOLN
4.0000 mg | Freq: Once | INTRAVENOUS | Status: AC
  Administered 2018-10-19: 4 mg via INTRAVENOUS
  Filled 2018-10-19: qty 1

## 2018-10-19 MED ORDER — SODIUM CHLORIDE (PF) 0.9 % IJ SOLN
INTRAMUSCULAR | Status: AC
  Filled 2018-10-19: qty 50

## 2018-10-19 MED ORDER — POLYETHYLENE GLYCOL 3350 17 GM/SCOOP PO POWD: 1.0000 | Freq: Once | ORAL | 0 refills | Status: AC

## 2018-10-19 MED ORDER — SODIUM CHLORIDE 0.9 % IV BOLUS
1000.0000 mL | Freq: Once | INTRAVENOUS | Status: AC
  Administered 2018-10-19: 1000 mL via INTRAVENOUS

## 2018-10-19 MED ORDER — DICLOFENAC SODIUM 75 MG PO TBEC: 75.0000 mg | DELAYED_RELEASE_TABLET | Freq: Two times a day (BID) | ORAL | 0 refills | Status: DC

## 2018-10-19 NOTE — ED Notes (Signed)
EDPA Provider at bedside. 

## 2018-10-19 NOTE — ED Triage Notes (Signed)
Patient c/o n/v X5 days.  Left lower abdominal pain.  Stabbing/throbbing 7/10 4 emesis occurrences in past 24 hrs.  2 weeks ago OBGYN  for biopsy for uterine cancer but were unable to continue. OBGYN unsure of what is causing patients pain. Next appointment is a hysterectomy.  Patient unable to get appointment with OBGYN today.   Started in October.

## 2018-10-19 NOTE — Discharge Instructions (Signed)
1. Medications: Start taking Voltaren twice daily with food.  Do not take naproxen or ibuprofen while taking this medicine.  You can take [phone] mg of Tylenol every 6 hours as needed for pain additionally. Do not exceed 4000 mg of Tylenol daily.  Take Zofran as needed for nausea.  Let this dissolve under your tongue.  Wait around 20 minutes before eating or drinking after taking this medication.  Start taking stool softener and fiber supplement. 2. Treatment: rest, drink plenty of fluids, advance diet slowly.  Start with water and broth then advance to bland foods that will not upset your stomach such as crackers, mashed potatoes, and peanut butter. 3. Follow Up: Please followup with your primary doctor in 3 days for discussion of your diagnoses and further evaluation after today's visit; if you do not have a primary care doctor use the resource guide provided to find one; Please return to the ER for persistent vomiting, high fevers or worsening symptoms

## 2018-10-19 NOTE — ED Provider Notes (Signed)
[facility] DEPT Provider Note   CSN: [id number] Arrival date & time: 10/19/18  1441     History   Chief Complaint Chief Complaint  Patient presents with  . Flank Pain  . Emesis    HPI Brandi Morris is a 42 y.o. female with history of CVA, HLD, nephrolithiasis, migraines, asthma, interstitial cystitis, endometriosis presents for evaluation of acute onset, progressively worsening left lower quadrant abdominal pain and nausea and vomiting for 5 days.  She reports that symptoms began on Thanksgiving day and she has not been able to keep any food down since then.  Left lower quadrant abdominal pain began yesterday and has been constant, waxing and waning.  Dull at times, sharp and severe other times.  No aggravating or alleviating factors noted.  She denies fevers, chills, chest pain, or shortness of breath.  She notes pain with urination but reports this is chronic secondary to her interstitial cystitis.  Denies diarrhea, constipation, melena or hematochezia.  Some light vaginal spotting today but reports this is not unusual for her. Denies vaginal itching or discharge. No suspicious food intake.  Has tried Phenergan with some relief of nausea and Tylenol and naproxen without relief of her pain.  Has had multiple abdominal surgeries.  Of note, pelvic ultrasound performed by her OB/GYN on 08/24/2018 showed 11 mm cystic area abutting the endometrium of the right uterine fundus, small right upper uterine segment fundal fibroid measuring 12 mm, and a dominant right ovarian follicular cyst measuring 18 mm.  The history is provided by the patient.    Past Medical History:  Diagnosis Date  . Asthma    Exercise induced  . Endometriosis   . Hyperlipidemia   . Interstitial cystitis   . Kidney stone   . Migraine   . Multiple allergies   . Stroke [facility])    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    Patient Active Problem List   Diagnosis Date Noted  . Back pain  09/11/2016  . Obesity 07/15/2015  . Upper airway cough syndrome 07/14/2015  . Cough variant asthma 07/14/2015  . Abnormal Pap smear of cervix 09/19/2014  . Breast lump on right side at 11 o'clock position 08/23/2014  . NEVUS, ATYPICAL 05/26/2009  . Cigarette smoker 05/26/2009  . FLANK PAIN, LEFT 04/07/2008  . NEPHROLITHIASIS, HX OF 10/19/2007  . Hyperlipidemia 07/30/2007  . Anxiety state 07/30/2007  . DEPRESSION 07/30/2007  . Migraine 07/30/2007  . ALLERGIC RHINITIS 07/30/2007  . Asthma 07/30/2007  . INTERSTITIAL CYSTITIS 07/30/2007  . INSOMNIA 07/30/2007  . PERIPHERAL EDEMA 07/30/2007    Past Surgical History:  Procedure Laterality Date  . ABLATION    . CERVICAL BIOPSY  W/ LOOP ELECTRODE EXCISION    . CHOLECYSTECTOMY    . COLPOSCOPY    . CYSTOSTOMY W/ BLADDER BIOPSY    . KNEE SURGERY    . LEEP    . RHINOPLASTY    . TENDON REPAIR Left 07/26/2015   Procedure: REPAIR WITH RECONSTRUCTION LEFT THUMB;  Surgeon: Charlotte Crumb, MD;  Location: [facility];  Service: Orthopedics;  Laterality: Left;  . TONSILLECTOMY    . TUBAL LIGATION       OB History    Gravida  5   Para  3   Term  3   Preterm      AB  2   Living  3     SAB  2   TAB      Ectopic  Multiple      Live Births               Home Medications    Prior to Admission medications   Medication Sig Start Date End Date Taking? Authorizing Provider  acetaminophen (TYLENOL) 500 MG tablet Take 1,000 mg by mouth every 6 (six) hours as needed for moderate pain or headache.   Yes [provider]  aspirin-acetaminophen-caffeine (EXCEDRIN MIGRAINE) [phone] MG tablet Take 4 tablets by mouth every 6 (six) hours as needed for headache.    Yes [provider]  CAMILA 0.35 MG tablet Take 1 tablet by mouth every evening.  10/02/18  Yes [provider]  diphenhydrAMINE (BENADRYL) 25 MG tablet Take 50 mg by mouth every 6 (six) hours as needed for itching or allergies.    Yes [provider]  naproxen (NAPROSYN) 500 MG tablet Take 500 mg by mouth 2 (two) times daily with a meal.  10/02/18  Yes [provider]  albuterol (PROVENTIL HFA;VENTOLIN HFA) 108 (90 BASE) MCG/ACT inhaler Inhale 1-2 puffs into the lungs every 6 (six) hours as needed for wheezing or shortness of breath. Patient not taking: Reported on 10/19/2018 07/02/15   Golden Circle, FNP  busPIRone (BUSPAR) 10 MG tablet Take 1 tablet (10 mg total) by mouth 3 (three) times daily. Patient not taking: Reported on 10/19/2018 09/11/16   Golden Circle, FNP  diclofenac (VOLTAREN) 75 MG EC tablet Take 1 tablet (75 mg total) by mouth 2 (two) times daily. 10/19/18   Jahmya Onofrio A, PA-C  docusate sodium (COLACE) 100 MG capsule Take 1 capsule (100 mg total) by mouth every 12 (twelve) hours. 10/19/18   Cohl Behrens A, PA-C  EPINEPHrine (EPIPEN 2-PAK) 0.3 mg/0.3 mL IJ SOAJ injection Inject 0.3 mg into the muscle once as needed (for severe allergic reaction).     [provider]  ondansetron (ZOFRAN ODT) 4 MG disintegrating tablet Take 1 tablet (4 mg total) by mouth every 8 (eight) hours as needed for nausea or vomiting. 10/19/18   Renita Papa, PA-C    Family History Family History  Problem Relation Age of Onset  . Heart disease Mother   . COPD Mother   . Diabetes Father   . Hypertension Father   . Stroke Father   . Heart disease Father   . Breast cancer Maternal Grandmother   . Cancer Maternal Grandfather        melanoma  . Heart disease Paternal Grandmother   . Stroke Paternal Grandmother   . Heart attack Paternal Grandmother   . Heart disease Paternal Grandfather   . Stroke Paternal Grandfather   . Heart attack Paternal Grandfather     Social History Social History   Tobacco Use  . Smoking status: Current Every Day Smoker    Packs/day: 1.00    Years: 25.00    Pack years: 25.00    Types: Cigarettes  . Smokeless tobacco: Never Used  Substance Use Topics  . Alcohol use: Yes     Comment: rarely  . Drug use: No     Allergies   Bee venom; Flexeril [cyclobenzaprine]; Lavender oil; Robaxin [methocarbamol]; Amitriptyline; Entex; Singulair [montelukast sodium]; Etodolac; Iodine; Mushroom extract complex; and Shellfish allergy   Review of Systems Review of Systems  Constitutional: Negative for chills and fever.  Respiratory: Negative for shortness of breath.   Cardiovascular: Negative for chest pain.  Gastrointestinal: Positive for abdominal pain, nausea and vomiting. Negative for blood in stool, constipation  and diarrhea.  Genitourinary: Positive for dysuria and vaginal bleeding. Negative for frequency, urgency, vaginal discharge and vaginal pain.  All other systems reviewed and are negative.    Physical Exam Updated Vital Signs BP 115/78 (BP Location: Right Arm)   Pulse 78   Temp 97.9 F (36.6 C) (Oral)   Resp 16   Ht 5\' 2"  (1.575 m)   Wt 78 kg   SpO2 96%   BMI 31.46 kg/m   Physical Exam  Constitutional: She appears well-developed and well-nourished. No distress.  HENT:  Head: Normocephalic and atraumatic.  Eyes: Conjunctivae are normal. Right eye exhibits no discharge. Left eye exhibits no discharge.  Neck: No JVD present. No tracheal deviation present.  Cardiovascular: Normal rate, regular rhythm and normal heart sounds.  Pulmonary/Chest: Effort normal and breath sounds normal.  Abdominal: Soft. Bowel sounds are normal. She exhibits no distension. There is tenderness in the right lower quadrant, suprapubic area and left lower quadrant. There is rebound, guarding and CVA tenderness. There is no rigidity, no tenderness at McBurney's point and negative Murphy's sign.  Maximally tender in the left lower quadrant, left CVA tenderness present.  Genitourinary:  Genitourinary Comments: Examination performed in the presence of a chaperone.  No masses or lesions to the external genitalia. Cervix does not appear friable. No cervical motion tenderness, left  adnexal tenderness noted. Scant amount of white discharge noted.   Musculoskeletal: She exhibits no edema.  Neurological: She is alert.  Skin: Skin is warm and dry. No erythema.  Psychiatric: She has a normal mood and affect. Her behavior is normal.  Nursing note and vitals reviewed.    ED Treatments / Results  Labs (all labs ordered are listed, but only abnormal results are displayed) Labs Reviewed  WET PREP, GENITAL - Abnormal; Notable for the following components:      Result Value   Clue Cells Wet Prep HPF POC PRESENT (*)    WBC, Wet Prep HPF POC RARE (*)    All other components within normal limits  COMPREHENSIVE METABOLIC PANEL - Abnormal; Notable for the following components:   Glucose, Bld 107 (*)    Calcium 8.6 (*)    Total Protein 6.4 (*)    AST 13 (*)    All other components within normal limits  CBC - Abnormal; Notable for the following components:   WBC 11.3 (*)    All other components within normal limits  LIPASE, BLOOD  URINALYSIS, ROUTINE W REFLEX MICROSCOPIC  RPR  HIV ANTIBODY (ROUTINE TESTING W REFLEX)  I-STAT BETA HCG BLOOD, ED ([facility], [facility], [facility] ONLY)  GC/CHLAMYDIA PROBE AMP ([place]) NOT AT [facility]    EKG None  Radiology Ct Abdomen Pelvis Wo Contrast  Result Date: 10/19/2018 CLINICAL DATA:  Nausea and vomiting x5 days with left lower abdominal pain. EXAM: CT ABDOMEN AND PELVIS WITHOUT CONTRAST TECHNIQUE: Multidetector CT imaging of the abdomen and pelvis was performed following the standard protocol without IV contrast. COMPARISON:  08/02/2013 FINDINGS: Lower chest: Normal heart size without pericardial effusion or thickening. Small hiatal hernia. Subsegmental atelectasis and/or scarring at the lung bases. Hepatobiliary: No focal liver abnormality is seen. Status post cholecystectomy. No biliary dilatation. Pancreas: Unremarkable. No pancreatic ductal dilatation or surrounding inflammatory changes. Spleen: Normal in size without focal abnormality.  Adrenals/Urinary Tract: Hypodense 2.2 cm right adrenal nodule likely representing a small adenoma. No perinephric fat stranding nor hydroureteronephrosis. Equivocal calculus in the lower pole the left kidney similar to prior measuring approximately 2 mm. Oval Stomach/Bowel:  Small hiatal hernia. Physiologic distention of the stomach with enteric contrast. Normal small bowel rotation and ligament of Treitz position. Contrast enters the cecum without obstruction. Normal caliber appendix. Moderate stool retention within the cecum and ascending colon. Colonic spasm noted along the mid transverse colon as well as descending. Moderate stool retention is otherwise noted in the rectosigmoid. No acute inflammation. Vascular/Lymphatic: Phleboliths are present within the lower pelvis bilaterally similar to prior. Mild-to-moderate aortic atherosclerosis without aneurysm. No lymphadenopathy. Reproductive: Uterus is unremarkable. There are adjacent follicles noted of the left ovary without worrisome features measuring overall 2.9 x 1.9 cm. Other: No free air nor free fluid. Tiny periumbilical fat containing hernia. Musculoskeletal: Moderate disc flattening L4-5. No acute nor suspicious osseous abnormalities. IMPRESSION: 1. Hypodense nodule of the right adrenal gland measuring 2.2 cm in diameter compatible with a small adenoma. 2. Tiny nonobstructing left lower pole calculus measuring 2 mm. No obstructive uropathy. 3. Average stool retention within the colon bowel obstruction or inflammation. 4. Probable adjacent follicles within left ovary, overall measuring 2.9 x 9 cm. Electronically Signed   By: Ashley Royalty M.D.   On: 10/19/2018 22:27    Procedures Procedures (including critical care time)  Medications Ordered in ED Medications  morphine 4 MG/ML injection 4 mg (4 mg Intravenous Given 10/19/18 1748)  ondansetron (ZOFRAN) injection 4 mg (4 mg Intravenous Given 10/19/18 1747)  sodium chloride 0.9 % bolus 1,000 mL (0 mLs  Intravenous Stopped 10/19/18 1905)     Initial Impression / Assessment and Plan / ED Course  I have reviewed the triage vital signs and the nursing notes.  Pertinent labs & imaging results that were available during my care of the patient were reviewed by me and considered in my medical decision making (see chart for details).     Patient with 5-day history of left lower quadrant abdominal pain, nausea, and vomiting.  She is afebrile, vital signs are stable.  Uncomfortable but nontoxic in appearance.  GU exam did not suggest PID.  Lab work reviewed by me shows nonspecific leukocytosis, no metabolic derangements or renal insufficiency.  LFTs and lipase reassuring.  UA does not suggest UTI or nephrolithiasis.  CT scan of the abdomen pelvis shows 2.2 cm nodule of the right adrenal gland likely a small adenoma.  Also shows tiny nonobstructing left lower pole calculus, colonic spasm along the mid transverse and descending colon with moderate stool retention.  No evidence of acute surgical abdominal pathology including obstruction, perforation, appendicitis, cholecystitis, diverticulitis, dissection, TOA, ovarian torsion, or ectopic pregnancy.  She was given IV fluids, antiemetics, and pain medicine.  On reevaluation patient is resting comfortably no apparent distress.  She is tolerating p.o. fluids without difficulty and serial abdominal examinations remain benign.  She is currently being worked up by her OB/GYN for pelvic pain and abnormal vaginal bleeding.  Recommend follow-up with OB/GYN and PCP or potentially gastroenterology for reevaluation of symptoms.  Will discharge with Voltaren, Zofran, stool softener, and fiber supplement.  Discussed strict ED return precautions. Pt verbalized understanding of and agreement with plan and is safe for discharge home at this time. Discussed with Dr. Lita Mains who agrees with assessment and plan at this time.  Final Clinical Impressions(s) / ED Diagnoses   Final  diagnoses:  Constipation, unspecified constipation type  Adenoma of right adrenal gland    ED Discharge Orders         Ordered    diclofenac (VOLTAREN) 75 MG EC tablet  2 times daily  10/19/18 2323    ondansetron (ZOFRAN ODT) 4 MG disintegrating tablet  Every 8 hours PRN     10/19/18 2323    docusate sodium (COLACE) 100 MG capsule  Every 12 hours     10/19/18 2323    polyethylene glycol powder (GLYCOLAX/MIRALAX) powder   Once     10/19/18 2323           Renita Papa, PA-C 10/20/18 1535    Julianne Rice, MD 10/22/18 1520

## 2018-10-19 NOTE — ED Notes (Signed)
Pt was drinking soda without difficulty.

## 2018-10-19 NOTE — ED Notes (Signed)
Signature pad not working at this time. Pt given discharge instructions and ambulated out without difficulty.

## 2018-10-20 LAB — HIV ANTIBODY (ROUTINE TESTING W REFLEX): HIV Screen 4th Generation wRfx: NONREACTIVE

## 2018-10-20 LAB — RPR: RPR Ser Ql: NONREACTIVE

## 2018-10-20 LAB — GC/CHLAMYDIA PROBE AMP (~~LOC~~) NOT AT ARMC
Chlamydia: NEGATIVE
Neisseria Gonorrhea: NEGATIVE

## 2018-10-27 ENCOUNTER — Ambulatory Visit: Payer: Managed Care, Other (non HMO) | Admitting: Internal Medicine

## 2018-10-27 DIAGNOSIS — Z0289 Encounter for other administrative examinations: Secondary | ICD-10-CM

## 2018-12-24 ENCOUNTER — Emergency Department (HOSPITAL_COMMUNITY): Payer: Managed Care, Other (non HMO)

## 2018-12-24 ENCOUNTER — Emergency Department (HOSPITAL_COMMUNITY)
Admission: EM | Admit: 2018-12-24 | Discharge: 2018-12-24 | Disposition: A | Discharge status: Home / Self Care | Payer: Managed Care, Other (non HMO) | Attending: Emergency Medicine | Admitting: Emergency Medicine

## 2018-12-24 ENCOUNTER — Other Ambulatory Visit: Payer: Self-pay

## 2018-12-24 ENCOUNTER — Encounter (HOSPITAL_COMMUNITY): Payer: Self-pay | Admitting: Emergency Medicine

## 2018-12-24 DIAGNOSIS — Z7982 Long term (current) use of aspirin: Secondary | ICD-10-CM | POA: Diagnosis not present

## 2018-12-24 DIAGNOSIS — T7840XA Allergy, unspecified, initial encounter: Secondary | ICD-10-CM

## 2018-12-24 DIAGNOSIS — F17210 Nicotine dependence, cigarettes, uncomplicated: Secondary | ICD-10-CM | POA: Diagnosis not present

## 2018-12-24 DIAGNOSIS — Z79899 Other long term (current) drug therapy: Secondary | ICD-10-CM | POA: Insufficient documentation

## 2018-12-24 DIAGNOSIS — J45909 Unspecified asthma, uncomplicated: Secondary | ICD-10-CM | POA: Diagnosis not present

## 2018-12-24 MED ORDER — FAMOTIDINE 20 MG PO TABS
20.0000 mg | ORAL_TABLET | Freq: Once | ORAL | Status: AC
  Administered 2018-12-24: 20 mg via ORAL
  Filled 2018-12-24: qty 1

## 2018-12-24 MED ORDER — METHYLPREDNISOLONE SODIUM SUCC 125 MG IJ SOLR
125.0000 mg | Freq: Once | INTRAMUSCULAR | Status: AC
  Administered 2018-12-24: 125 mg via INTRAMUSCULAR
  Filled 2018-12-24: qty 2

## 2018-12-24 MED ORDER — PREDNISONE 20 MG PO TABS: ORAL_TABLET | ORAL | 0 refills | Status: DC

## 2018-12-24 MED ORDER — ALBUTEROL SULFATE HFA 108 (90 BASE) MCG/ACT IN AERS
2.0000 | INHALATION_SPRAY | Freq: Once | RESPIRATORY_TRACT | Status: AC
  Administered 2018-12-24: 2 via RESPIRATORY_TRACT
  Filled 2018-12-24: qty 6.7

## 2018-12-24 NOTE — ED Notes (Signed)
Patient given discharge teaching and verbalized understanding. Patient ambulated out of ED with a steady gait. 

## 2018-12-24 NOTE — ED Triage Notes (Signed)
Patient arrived by EMS from work. 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 Pt c/o coughing and sinus drainage. Pt has no hives or GI upset per EMS per EMS. Pt administered her own EPI Pen. Pt alert and oriented x 4.   Hx of anaphylaxis.   NSR HR 95, BP 126/78, SPO2 98%, and RR 18.

## 2018-12-24 NOTE — ED Notes (Signed)
Bed: WA03 Expected date:  Expected time:  Means of arrival:  Comments: EMS allergic reaction, had epipen, no anaphylaxis at this time

## 2018-12-24 NOTE — ED Provider Notes (Signed)
[facility] DEPT Provider Note   CSN: [id number] Arrival date & time: 12/24/18  1020     History   Chief Complaint Chief Complaint  Patient presents with  . Allergic Reaction    HPI Brandi Morris is a 43 y.o. female.  HPI Patient presents with rhinorrhea and cough which started after exposure to lavender scent.  States this is typical for her allergic reactions.  States symptoms started around 10 AM this morning.  She used her EpiPen and called EMS.  States that she is still having some cough but denies any difficulty breathing.  No intraoral swelling.  Denies any new rashes.  No vomiting.  Patient states she takes Benadryl daily and had 2 doses prior to coming to the emergency department. Past Medical History:  Diagnosis Date  . Asthma    Exercise induced  . Endometriosis   . Hyperlipidemia   . Interstitial cystitis   . Kidney stone   . Migraine   . Multiple allergies   . Stroke [facility])    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    Patient Active Problem List   Diagnosis Date Noted  . Hyperlipidemia 07/30/2007  . Anxiety and depression 07/30/2007  . Migraine 07/30/2007  . ALLERGIC RHINITIS 07/30/2007  . Asthma 07/30/2007  . INTERSTITIAL CYSTITIS 07/30/2007  . INSOMNIA 07/30/2007    Past Surgical History:  Procedure Laterality Date  . ABLATION    . CERVICAL BIOPSY  W/ LOOP ELECTRODE EXCISION    . CHOLECYSTECTOMY    . COLPOSCOPY    . CYSTOSTOMY W/ BLADDER BIOPSY    . KNEE SURGERY    . LEEP    . RHINOPLASTY    . TENDON REPAIR Left 07/26/2015   Procedure: REPAIR WITH RECONSTRUCTION LEFT THUMB;  Surgeon: Charlotte Crumb, MD;  Location: [facility];  Service: Orthopedics;  Laterality: Left;  . TONSILLECTOMY    . TUBAL LIGATION       OB History    Gravida  5   Para  3   Term  3   Preterm      AB  2   Living  3     SAB  2   TAB      Ectopic      Multiple      Live Births               Home Medications    Prior  to Admission medications   Medication Sig Start Date End Date Taking? Authorizing Provider  albuterol (PROVENTIL HFA;VENTOLIN HFA) 108 (90 BASE) MCG/ACT inhaler Inhale 1-2 puffs into the lungs every 6 (six) hours as needed for wheezing or shortness of breath. 07/02/15  Yes Golden Circle, FNP  aspirin-acetaminophen-caffeine (EXCEDRIN MIGRAINE) [phone] MG tablet Take 4 tablets by mouth every 6 (six) hours as needed for headache.    Yes [provider]  diphenhydrAMINE (BENADRYL) 25 MG tablet Take 50 mg by mouth every 6 (six) hours as needed for itching or allergies.    Yes [provider]  EPINEPHrine (EPIPEN 2-PAK) 0.3 mg/0.3 mL IJ SOAJ injection Inject 0.3 mg into the muscle once as needed (for severe allergic reaction).    Yes [provider]  busPIRone (BUSPAR) 10 MG tablet Take 1 tablet (10 mg total) by mouth 3 (three) times daily. Patient not taking: Reported on 10/19/2018 09/11/16   Golden Circle, FNP  CAMILA 0.35 MG tablet Take 1 tablet by mouth every evening.  10/02/18  [provider]  diclofenac (VOLTAREN) 75 MG EC tablet Take 1 tablet (75 mg total) by mouth 2 (two) times daily. Patient not taking: Reported on 12/24/2018 10/19/18   Rodell Perna A, PA-C  docusate sodium (COLACE) 100 MG capsule Take 1 capsule (100 mg total) by mouth every 12 (twelve) hours. Patient not taking: Reported on 12/24/2018 10/19/18   Rodell Perna A, PA-C  ondansetron (ZOFRAN ODT) 4 MG disintegrating tablet Take 1 tablet (4 mg total) by mouth every 8 (eight) hours as needed for nausea or vomiting. Patient not taking: Reported on 12/24/2018 10/19/18   Rodell Perna A, PA-C  predniSONE (DELTASONE) 20 MG tablet 3 tabs po day one, then 2 po daily x 4 days 12/25/18   Julianne Rice, MD    Family History Family History  Problem Relation Age of Onset  . Heart disease Mother   . COPD Mother   . Diabetes Father   . Hypertension Father   . Stroke Father   . Heart disease Father   .  Breast cancer Maternal Grandmother   . Cancer Maternal Grandfather        melanoma  . Heart disease Paternal Grandmother   . Stroke Paternal Grandmother   . Heart attack Paternal Grandmother   . Heart disease Paternal Grandfather   . Stroke Paternal Grandfather   . Heart attack Paternal Grandfather     Social History Social History   Tobacco Use  . Smoking status: Current Every Day Smoker    Packs/day: 1.00    Years: 25.00    Pack years: 25.00    Types: Cigarettes  . Smokeless tobacco: Never Used  Substance Use Topics  . Alcohol use: Yes    Comment: rarely  . Drug use: No     Allergies   Bee venom; Flexeril [cyclobenzaprine]; Lavender oil; Robaxin [methocarbamol]; Amitriptyline; Entex; Singulair [montelukast sodium]; Etodolac; Iodine; Mushroom extract complex; and Shellfish allergy   Review of Systems Review of Systems  Constitutional: Negative for chills and fever.  HENT: Positive for rhinorrhea. Negative for sore throat and trouble swallowing.   Respiratory: Positive for cough. Negative for shortness of breath and wheezing.   Cardiovascular: Negative for chest pain, palpitations and leg swelling.  Gastrointestinal: Negative for abdominal pain, constipation, diarrhea, nausea and vomiting.  Musculoskeletal: Negative for back pain, myalgias and neck pain.  Skin: Negative for rash and wound.  Neurological: Negative for dizziness, weakness, light-headedness, numbness and headaches.  All other systems reviewed and are negative.    Physical Exam Updated Vital Signs BP 99/68   Pulse 76   Temp 98.2 F (36.8 C) (Oral)   Resp (!) 24   Ht 5\' 2"  (1.575 m)   Wt 77.1 kg   SpO2 99%   BMI 31.09 kg/m   Physical Exam Vitals signs and nursing note reviewed.  Constitutional:      General: She is not in acute distress.    Appearance: Normal appearance. She is well-developed. She is not ill-appearing.  HENT:     Head: Normocephalic and atraumatic.     Nose: Congestion  present.     Mouth/Throat:     Mouth: Mucous membranes are moist.  Eyes:     Extraocular Movements: Extraocular movements intact.     Conjunctiva/sclera: Conjunctivae normal.     Pupils: Pupils are equal, round, and reactive to light.  Neck:     Musculoskeletal: Normal range of motion and neck supple. No neck rigidity or muscular tenderness.  Cardiovascular:  Rate and Rhythm: Normal rate and regular rhythm.     Heart sounds: No murmur. No friction rub. No gallop.   Pulmonary:     Effort: Pulmonary effort is normal.     Breath sounds: Normal breath sounds.     Comments: Diminished breath sounds in bilateral bases.  No respiratory distress. Abdominal:     General: Bowel sounds are normal.     Palpations: Abdomen is soft.     Tenderness: There is no abdominal tenderness. There is no guarding or rebound.  Musculoskeletal: Normal range of motion.        General: No swelling, tenderness, deformity or signs of injury.     Right lower leg: No edema.  Lymphadenopathy:     Cervical: No cervical adenopathy.  Skin:    General: Skin is warm and dry.     Capillary Refill: Capillary refill takes less than 2 seconds.     Findings: No erythema or rash.  Neurological:     General: No focal deficit present.     Mental Status: She is alert and oriented to person, place, and time.  Psychiatric:        Mood and Affect: Mood normal.        Behavior: Behavior normal.      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 Pt c/o coughing and sinus drainage. Pt has no hives or GI upset per EMS per EMS. Pt administered her own EPI Pen. No cardiac hx., smoker EXAM: CHEST - 2 VIEW COMPARISON:  07/28/2015 FINDINGS: Cardiac silhouette is normal in size. Normal mediastinal and hilar contours. Clear  lungs.  No pleural effusion or pneumothorax. Skeletal structures are unremarkable. IMPRESSION: Normal chest radiographs. Electronically Signed   By: Lajean Manes M.D.   On: 12/24/2018 11:50    Procedures Procedures (including critical care time)  Medications Ordered in ED Medications  methylPREDNISolone sodium succinate (SOLU-MEDROL) 125 mg/2 mL injection 125 mg (125 mg Intramuscular Given 12/24/18 1138)  famotidine (PEPCID) tablet 20 mg (20 mg Oral Given 12/24/18 1139)  albuterol (PROVENTIL HFA;VENTOLIN HFA) 108 (90 Base) MCG/ACT inhaler 2 puff (2 puffs Inhalation Given 12/24/18 1139)     Initial Impression / Assessment and Plan / ED Course  I have reviewed the triage vital signs and the nursing notes.  Pertinent labs & imaging results that were available during my care of the patient were reviewed by me and considered in my medical decision making (see chart for details).     Observe for hours in the emergency department.  Protecting airway.  States she is feeling better after albuterol treatment.  Will give short course of prednisone.  Return precautions given.  Final Clinical Impressions(s) / ED Diagnoses   Final diagnoses:  Allergic reaction, initial encounter    ED Discharge Orders         Ordered    predniSONE (DELTASONE) 20 MG tablet     12/24/18 1411           Julianne Rice, MD 12/24/18 1413

## 2019-03-23 DIAGNOSIS — N941 Unspecified dyspareunia: Secondary | ICD-10-CM | POA: Insufficient documentation

## 2019-03-23 DIAGNOSIS — G8929 Other chronic pain: Secondary | ICD-10-CM | POA: Insufficient documentation

## 2019-05-17 ENCOUNTER — Other Ambulatory Visit: Payer: Self-pay

## 2019-05-17 ENCOUNTER — Ambulatory Visit
Admission: RE | Admit: 2019-05-17 | Discharge: 2019-05-17 | Disposition: A | Discharge status: Home / Self Care | Payer: Managed Care, Other (non HMO) | Source: Ambulatory Visit | Attending: Family Medicine | Admitting: Family Medicine

## 2019-05-17 ENCOUNTER — Other Ambulatory Visit: Payer: Self-pay | Admitting: Family Medicine

## 2019-05-17 DIAGNOSIS — Z01818 Encounter for other preprocedural examination: Secondary | ICD-10-CM

## 2019-05-20 ENCOUNTER — Telehealth: Payer: Self-pay | Admitting: Family Medicine

## 2019-05-20 NOTE — Telephone Encounter (Signed)
I have called pt multiple times to set up surgical clearance appt, left 2 msgs. I see today pt has a new pcp listed.

## 2020-04-13 ENCOUNTER — Emergency Department (HOSPITAL_COMMUNITY)
Admission: EM | Admit: 2020-04-13 | Discharge: 2020-04-14 | Disposition: A | Discharge status: Home / Self Care | Payer: Self-pay | Attending: Emergency Medicine | Admitting: Emergency Medicine

## 2020-04-13 ENCOUNTER — Other Ambulatory Visit: Payer: Self-pay

## 2020-04-13 ENCOUNTER — Encounter (HOSPITAL_COMMUNITY): Payer: Self-pay

## 2020-04-13 DIAGNOSIS — M25562 Pain in left knee: Secondary | ICD-10-CM | POA: Insufficient documentation

## 2020-04-13 DIAGNOSIS — Z5321 Procedure and treatment not carried out due to patient leaving prior to being seen by health care provider: Secondary | ICD-10-CM | POA: Insufficient documentation

## 2020-04-13 NOTE — ED Triage Notes (Signed)
Patient c/o left leg swelling and pain behind her left knee that started today.

## 2020-12-22 ENCOUNTER — Other Ambulatory Visit: Payer: Self-pay

## 2020-12-22 ENCOUNTER — Other Ambulatory Visit: Payer: Self-pay | Admitting: Occupational Medicine

## 2020-12-22 ENCOUNTER — Ambulatory Visit: Payer: Self-pay

## 2020-12-22 DIAGNOSIS — M79642 Pain in left hand: Secondary | ICD-10-CM

## 2021-01-04 DIAGNOSIS — M25542 Pain in joints of left hand: Secondary | ICD-10-CM | POA: Insufficient documentation

## 2021-01-04 DIAGNOSIS — M189 Osteoarthritis of first carpometacarpal joint, unspecified: Secondary | ICD-10-CM | POA: Insufficient documentation

## 2021-07-30 ENCOUNTER — Other Ambulatory Visit: Payer: Self-pay

## 2021-07-30 ENCOUNTER — Emergency Department (HOSPITAL_BASED_OUTPATIENT_CLINIC_OR_DEPARTMENT_OTHER)
Admission: EM | Admit: 2021-07-30 | Discharge: 2021-07-30 | Disposition: A | Discharge status: Home / Self Care | Payer: Commercial Managed Care - PPO | Attending: Emergency Medicine | Admitting: Emergency Medicine

## 2021-07-30 ENCOUNTER — Encounter (HOSPITAL_BASED_OUTPATIENT_CLINIC_OR_DEPARTMENT_OTHER): Payer: Self-pay

## 2021-07-30 ENCOUNTER — Emergency Department (HOSPITAL_BASED_OUTPATIENT_CLINIC_OR_DEPARTMENT_OTHER): Payer: Commercial Managed Care - PPO

## 2021-07-30 ENCOUNTER — Ambulatory Visit: Payer: Commercial Managed Care - PPO

## 2021-07-30 DIAGNOSIS — R197 Diarrhea, unspecified: Secondary | ICD-10-CM | POA: Insufficient documentation

## 2021-07-30 DIAGNOSIS — J45909 Unspecified asthma, uncomplicated: Secondary | ICD-10-CM | POA: Diagnosis not present

## 2021-07-30 DIAGNOSIS — R112 Nausea with vomiting, unspecified: Secondary | ICD-10-CM | POA: Diagnosis present

## 2021-07-30 DIAGNOSIS — R1030 Lower abdominal pain, unspecified: Secondary | ICD-10-CM | POA: Diagnosis not present

## 2021-07-30 DIAGNOSIS — F17210 Nicotine dependence, cigarettes, uncomplicated: Secondary | ICD-10-CM | POA: Insufficient documentation

## 2021-07-30 LAB — COMPREHENSIVE METABOLIC PANEL
ALT: 10 U/L (ref 0–44)
AST: 13 U/L — ABNORMAL LOW (ref 15–41)
Albumin: 4.2 g/dL (ref 3.5–5.0)
Alkaline Phosphatase: 62 U/L (ref 38–126)
Anion gap: 8 (ref 5–15)
BUN: 10 mg/dL (ref 6–20)
CO2: 22 mmol/L (ref 22–32)
Calcium: 8.7 mg/dL — ABNORMAL LOW (ref 8.9–10.3)
Chloride: 107 mmol/L (ref 98–111)
Creatinine, Ser: 0.62 mg/dL (ref 0.44–1.00)
GFR, Estimated: >60 mL/min (ref >60)
Glucose, Bld: 94 mg/dL (ref 70–99)
Potassium: 3.7 mmol/L (ref 3.5–5.1)
Sodium: 137 mmol/L (ref 135–145)
Total Bilirubin: 0.5 mg/dL (ref 0.3–1.2)
Total Protein: 7.3 g/dL (ref 6.5–8.1)

## 2021-07-30 LAB — CBC
HCT: 45.5 % (ref 36.0–46.0)
Hemoglobin: 15.5 g/dL — ABNORMAL HIGH (ref 12.0–15.0)
MCH: 30.5 pg (ref 26.0–34.0)
MCHC: 34.1 g/dL (ref 30.0–36.0)
MCV: 89.6 fL (ref 80.0–100.0)
Platelets: 299 10*3/uL (ref 150–400)
RBC: 5.08 MIL/uL (ref 3.87–5.11)
RDW: 13.4 % (ref 11.5–15.5)
WBC: 10 10*3/uL (ref 4.0–10.5)
nRBC: 0 % (ref 0.0–0.2)

## 2021-07-30 LAB — URINALYSIS, ROUTINE W REFLEX MICROSCOPIC
Bilirubin Urine: NEGATIVE
Glucose, UA: NEGATIVE mg/dL
Hgb urine dipstick: NEGATIVE
Ketones, ur: NEGATIVE mg/dL
Leukocytes,Ua: NEGATIVE
Nitrite: NEGATIVE
Protein, ur: NEGATIVE mg/dL
Specific Gravity, Urine: 1.005 (ref 1.005–1.030)
pH: 6 (ref 5.0–8.0)

## 2021-07-30 LAB — LIPASE, BLOOD: Lipase: 28 U/L (ref 11–51)

## 2021-07-30 LAB — PREGNANCY, URINE: Preg Test, Ur: NEGATIVE

## 2021-07-30 MED ORDER — KETOROLAC TROMETHAMINE 60 MG/2ML IM SOLN
30.0000 mg | Freq: Once | INTRAMUSCULAR | Status: AC
  Administered 2021-07-30: 30 mg via INTRAMUSCULAR
  Filled 2021-07-30: qty 2

## 2021-07-30 MED ORDER — KETOROLAC TROMETHAMINE 15 MG/ML IJ SOLN
15.0000 mg | Freq: Once | INTRAMUSCULAR | Status: DC
  Filled 2021-07-30: qty 1

## 2021-07-30 MED ORDER — ONDANSETRON HCL 4 MG PO TABS
4.0000 mg | ORAL_TABLET | Freq: Four times a day (QID) | ORAL | 0 refills | Status: DC
Start: 2021-07-30 — End: 2021-10-03

## 2021-07-30 NOTE — ED Provider Notes (Signed)
[facility] Provider Note   CSN: [id number] Arrival date & time: 07/30/21  1240     History Chief Complaint  Patient presents with   Abdominal Pain    Brandi Morris is a 45 y.o. female.  Patient is a 45 yo female presenting for nausea, vomiting, diarrhea, and suprapubic abdominal pain that occurs within in 20 minutes of eating and resolves after diarrhea x 5 months, since April 2022. Pt had had negative stool cultures completed with pcp. Had scheduled GI appointment but missed it due to husband being ill in ICU. Patient denies fevers or chills. Denies family hx of colon cancer. Family hx positive for IBS only.   The history is provided by the patient. No language interpreter was used.  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    Patient Active Problem List   Diagnosis Date Noted   Hyperlipidemia 07/30/2007   Anxiety and depression 07/30/2007   Migraine 07/30/2007   ALLERGIC RHINITIS 07/30/2007   Asthma 07/30/2007   INTERSTITIAL CYSTITIS 07/30/2007   INSOMNIA 07/30/2007    Past Surgical History:  Procedure Laterality Date   ABDOMINAL HYSTERECTOMY     ABLATION     CERVICAL BIOPSY  W/ LOOP ELECTRODE EXCISION     CHOLECYSTECTOMY     COLPOSCOPY     CYSTOSTOMY W/ BLADDER BIOPSY     KNEE SURGERY     LEEP     RHINOPLASTY     TENDON REPAIR Left 07/26/2015   Procedure: REPAIR WITH RECONSTRUCTION LEFT THUMB;  Surgeon: Charlotte Crumb, MD;  Location: [facility];  Service: Orthopedics;  Laterality: Left;   TONSILLECTOMY     TUBAL LIGATION        OB History     Gravida  5   Para  3   Term  3   Preterm      AB  2   Living  3      SAB  2   IAB      Ectopic      Multiple      Live Births              Family History  Problem Relation Age of Onset   Heart disease Mother    COPD Mother    Diabetes Father    Hypertension Father    Stroke Father    Heart disease Father    Breast cancer Maternal Grandmother    Cancer Maternal Grandfather        melanoma   Heart disease Paternal Grandmother    Stroke Paternal Grandmother    Heart attack Paternal Grandmother    Heart disease Paternal Grandfather    Stroke Paternal Grandfather    Heart attack Paternal Grandfather     Social History   Tobacco Use   Smoking status: Every Day    Packs/day: 1.00    Years: 25.00    Pack years: 25.00    Types: Cigarettes   Smokeless tobacco: Never  Vaping Use   Vaping Use: Never used  Substance Use Topics   Alcohol use: Not Currently   Drug use: No    Home Medications Prior to Admission medications   Medication Sig Start Date End Date Taking? Authorizing Provider  albuterol (PROVENTIL HFA;VENTOLIN HFA) 108 (90 BASE) MCG/ACT inhaler Inhale 1-2 puffs into the lungs every 6 (six) hours as needed for wheezing or shortness of breath. 07/02/15   Golden Circle, FNP  aspirin-acetaminophen-caffeine (EXCEDRIN MIGRAINE) [phone] MG tablet Take 4 tablets by mouth every 6 (six) hours as needed for headache.     [provider]  busPIRone (BUSPAR) 10 MG tablet Take 1 tablet (10 mg total) by mouth 3 (three) times daily. Patient not taking: Reported on 10/19/2018 09/11/16   Golden Circle, FNP  CAMILA 0.35 MG tablet Take 1 tablet by mouth every evening.  10/02/18   [provider]  diclofenac (VOLTAREN) 75 MG EC tablet Take 1 tablet (75 mg total) by mouth 2 (two) times daily. Patient not taking: Reported on 12/24/2018 10/19/18   Rodell Perna A, PA-C  diphenhydrAMINE (BENADRYL) 25 MG tablet Take 50 mg by  mouth every 6 (six) hours as needed for itching or allergies.     [provider]  docusate sodium (COLACE) 100 MG capsule Take 1 capsule (100 mg total) by mouth every 12 (twelve) hours. Patient not taking: Reported on 12/24/2018 10/19/18   Rodell Perna A, PA-C  EPINEPHrine (EPIPEN 2-PAK) 0.3 mg/0.3 mL IJ SOAJ injection Inject 0.3 mg into the muscle once as needed (for severe allergic reaction).     [provider]  ondansetron (ZOFRAN ODT) 4 MG disintegrating tablet Take 1 tablet (4 mg total) by mouth every 8 (eight) hours as needed for nausea or vomiting. Patient not taking: Reported on 12/24/2018 10/19/18   Rodell Perna A, PA-C  predniSONE (DELTASONE) 20 MG tablet 3 tabs po day one, then 2 po daily x 4 days 12/25/18   Julianne Rice, MD    Allergies    Bee venom, Flexeril [cyclobenzaprine], Lavender oil, Robaxin [methocarbamol], Amitriptyline, Entex, Singulair [montelukast sodium], Etodolac, Iodine, Mushroom extract complex, and Shellfish allergy  Review of Systems   Review of Systems  Constitutional:  Negative for chills and fever.  HENT:  Negative for ear pain and sore throat.   Eyes:  Negative for pain and visual disturbance.  Respiratory:  Negative for cough and shortness of breath.   Cardiovascular:  Negative for chest pain and palpitations.  Gastrointestinal:  Positive for abdominal pain, diarrhea, nausea and vomiting.  Genitourinary:  Negative for dysuria and hematuria.  Musculoskeletal:  Negative for arthralgias and back pain.  Skin:  Negative for color change and rash.  Neurological:  Negative for seizures and syncope.  All other systems reviewed and are negative.  Physical Exam Updated Vital Signs BP (!) 116/91 (BP Location: Left Arm)   Pulse 95   Temp 98.4 F (36.9 C) (Oral)   Resp 20   Ht '5\' 2"'$  (1.575 m)   Wt 78.9 kg   LMP 03/06/2007   SpO2 99%   BMI 31.83 kg/m   Physical Exam Vitals and nursing note reviewed.  Constitutional:      General: She is  not in acute distress.    Appearance: She is well-developed.  HENT:     Head: Normocephalic and atraumatic.  Eyes:     Conjunctiva/sclera: Conjunctivae normal.  Cardiovascular:     Rate and Rhythm: Normal rate and regular rhythm.  Heart sounds: No murmur heard. Pulmonary:     Effort: Pulmonary effort is normal. No respiratory distress.     Breath sounds: Normal breath sounds.  Abdominal:     Palpations: Abdomen is soft.     Tenderness: There is no abdominal tenderness.  Musculoskeletal:     Cervical back: Neck supple.  Skin:    General: Skin is warm and dry.  Neurological:     Mental Status: She is alert.    ED Results / Procedures / Treatments   Labs (all labs ordered are listed, but only abnormal results are displayed) Labs Reviewed  COMPREHENSIVE METABOLIC PANEL - Abnormal; Notable for the following components:      Result Value   Calcium 8.7 (*)    AST 13 (*)    All other components within normal limits  CBC - Abnormal; Notable for the following components:   Hemoglobin 15.5 (*)    All other components within normal limits  LIPASE, BLOOD  URINALYSIS, ROUTINE W REFLEX MICROSCOPIC  PREGNANCY, URINE    EKG None  Radiology No results found.  Procedures Procedures   Medications Ordered in ED Medications  ketorolac (TORADOL) injection 30 mg (has no administration in time range)    ED Course  I have reviewed the triage vital signs and the nursing notes.  Pertinent labs & imaging results that were available during my care of the patient were reviewed by me and considered in my medical decision making (see chart for details).    MDM Rules/Calculators/A&P   3:50 PM  45 yo female presenting for nausea, vomiting, diarrhea, and suprapubic abdominal pain that occurs within in 20 minutes of eating and resolves after diarrhea x 5 months, since April 2022.  Pt is Aox3, no acute distress, afebrile, with stable vitals. Abdomen soft and nontender.   -Surgical hx  of cholecystectomy. -No signs/symptoms of sepsis -Labs demonstrate stable liver profile, lipase, and renal function.  -CT abdomen/pelvis with iv contrast unable to be completed due to hx of contrast allergy. CT non-contrast demonstrates no acute process.  -Pt's symptoms have been persistent x 4-5 months. Recommended follow up with GI. Pt agreeable to plan. I spoke with representative from [facility] who is able to make her an appointment for October 26th. They will call patient with more information.   Patient in no distress and overall condition improved here in the ED. Detailed discussions were had with the patient regarding current findings, and need for close f/u with PCP or on call doctor. The patient has been instructed to return immediately if the symptoms worsen in any way for re-evaluation. Patient verbalized understanding and is in agreement with current care plan. All questions answered prior to discharge.       Final Clinical Impression(s) / ED Diagnoses Final diagnoses:  Nausea vomiting and diarrhea    Rx / DC Orders ED Discharge Orders     None        Lianne Cure, DO [id number]

## 2021-07-30 NOTE — Discharge Instructions (Addendum)
Please follow up with GI specialist.

## 2021-07-30 NOTE — ED Triage Notes (Signed)
Pt c/o abd pain, n/v/d x 4 days-grimacing/bent over gait

## 2021-09-06 DIAGNOSIS — R2232 Localized swelling, mass and lump, left upper limb: Secondary | ICD-10-CM | POA: Insufficient documentation

## 2021-10-03 ENCOUNTER — Other Ambulatory Visit: Payer: Self-pay

## 2021-10-03 ENCOUNTER — Ambulatory Visit
Admission: EM | Admit: 2021-10-03 | Discharge: 2021-10-03 | Disposition: A | Discharge status: Home / Self Care | Payer: Commercial Managed Care - PPO

## 2021-10-03 DIAGNOSIS — K047 Periapical abscess without sinus: Secondary | ICD-10-CM | POA: Diagnosis not present

## 2021-10-03 MED ORDER — AMOXICILLIN 500 MG PO CAPS: 500.0000 mg | ORAL_CAPSULE | Freq: Three times a day (TID) | ORAL | 0 refills | Status: DC

## 2021-10-03 NOTE — ED Provider Notes (Signed)
[facility]    CSN: [id number] Arrival date & time: 10/03/21  0834      History   Chief Complaint Chief Complaint  Patient presents with   Dental Pain    HPI Brandi Morris is a 45 y.o. female.   Patient here today for evaluation of swelling to her right top molar that has been present for several days.  She reports that she was previously taking Bactrim but has not taken in a week.  This was prescribed for her for thumb by Ortho.  She states that she has had a root canal on the tooth of concern, so she is not feeling pain but does feel extra pressure when she is chewing and notes swelling to the right side of her mouth.  The history is provided by the patient.   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    Patient Active Problem List   Diagnosis Date Noted   Hyperlipidemia 07/30/2007   Anxiety and depression 07/30/2007   Migraine 07/30/2007   ALLERGIC RHINITIS 07/30/2007   Asthma 07/30/2007   INTERSTITIAL CYSTITIS 07/30/2007   INSOMNIA 07/30/2007    Past Surgical History:  Procedure Laterality Date   ABDOMINAL HYSTERECTOMY     ABLATION     CERVICAL BIOPSY  W/ LOOP ELECTRODE EXCISION     CHOLECYSTECTOMY     COLPOSCOPY     CYSTOSTOMY W/ BLADDER BIOPSY     KNEE SURGERY     LEEP     RHINOPLASTY     TENDON REPAIR Left 07/26/2015   Procedure: REPAIR WITH RECONSTRUCTION LEFT THUMB;  Surgeon: Charlotte Crumb, MD;  Location: [facility];  Service: Orthopedics;  Laterality: Left;   TONSILLECTOMY     TUBAL LIGATION      OB History     Gravida  5   Para  3   Term  3   Preterm      AB  2   Living  3      SAB  2   IAB      Ectopic      Multiple      Live Births               Home Medications    Prior to Admission medications    Medication Sig Start Date End Date Taking? Authorizing Provider  amoxicillin (AMOXIL) 500 MG capsule Take 1 capsule (500 mg total) by mouth 3 (three) times daily. 10/03/21  Yes Francene Finders, PA-C  albuterol (PROVENTIL HFA;VENTOLIN HFA) 108 (90 BASE) MCG/ACT inhaler Inhale 1-2 puffs into the lungs every 6 (six) hours as needed for wheezing or shortness of breath. 07/02/15   Golden Circle, FNP  aspirin-acetaminophen-caffeine (EXCEDRIN MIGRAINE) [phone] MG tablet Take 4 tablets by mouth every 6 (six) hours as needed for headache.     [provider]  CAMILA 0.35 MG tablet Take 1 tablet by mouth every evening.  10/02/18   [provider]  diphenhydrAMINE (BENADRYL) 25 MG tablet Take 50 mg by mouth every 6 (six) hours as needed for itching or allergies.     [provider]  EPINEPHrine (EPIPEN 2-PAK) 0.3 mg/0.3 mL IJ SOAJ injection Inject 0.3 mg into the  muscle once as needed (for severe allergic reaction).     [provider]  gabapentin (NEURONTIN) 300 MG capsule gabapentin 300 mg capsule  TAKE 1 CAPSULE BY MOUTH AT BEDTIME FOR HOT FLASH PREVENTION    [provider]  meloxicam (MOBIC) 7.5 MG tablet meloxicam 7.5 mg tablet  TAKE 1 TABLET BY MOUTH TWICE A DAY WITH FOOD    [provider]  predniSONE (DELTASONE) 20 MG tablet 3 tabs po day one, then 2 po daily x 4 days 12/25/18   Julianne Rice, MD  sulfamethoxazole-trimethoprim (BACTRIM DS) 800-160 MG tablet Bactrim DS 800 mg-160 mg tablet  Take One tab PO BID until complete    [provider]    Family History Family History  Problem Relation Age of Onset   Heart disease Mother    COPD Mother    Diabetes Father    Hypertension Father    Stroke Father    Heart disease Father    Breast cancer Maternal Grandmother    Cancer Maternal Grandfather        melanoma   Heart disease Paternal Grandmother    Stroke Paternal Grandmother    Heart attack Paternal Grandmother     Heart disease Paternal Grandfather    Stroke Paternal Grandfather    Heart attack Paternal Grandfather     Social History Social History   Tobacco Use   Smoking status: Every Day    Packs/day: 1.00    Years: 25.00    Pack years: 25.00    Types: Cigarettes   Smokeless tobacco: Never  Vaping Use   Vaping Use: Never used  Substance Use Topics   Alcohol use: Not Currently   Drug use: No     Allergies   Bee venom, Flexeril [cyclobenzaprine], Lavender oil, Robaxin [methocarbamol], Amitriptyline, Entex, Singulair [montelukast sodium], Etodolac, Iodine, Mushroom extract complex, and Shellfish allergy   Review of Systems Review of Systems  Constitutional:  Negative for chills and fever.  HENT:  Positive for dental problem.   Eyes:  Negative for discharge and redness.  Respiratory:  Negative for shortness of breath.   Gastrointestinal:  Negative for abdominal pain, nausea and vomiting.    Physical Exam Triage Vital Signs ED Triage Vitals  Enc Vitals Group     BP      Pulse      Resp      Temp      Temp src      SpO2      Weight      Height      Head Circumference      Peak Flow      Pain Score      Pain Loc      Pain Edu?      Excl. in [place]?    No data found.  Updated Vital Signs BP 115/76 (BP Location: Left Arm)   Pulse 79   Temp 98.1 F (36.7 C) (Oral)   Resp 18   LMP 03/06/2007   SpO2 97%      Physical Exam Vitals and nursing note reviewed.  Constitutional:      General: She is not in acute distress.    Appearance: Normal appearance. She is not ill-appearing.  HENT:     Head: Normocephalic and atraumatic.     Mouth/Throat:     Mouth: Mucous membranes are moist.     Dentition: Abnormal dentition. Gingival swelling and dental abscesses present.      Comments: Location of  abscess Eyes:     Conjunctiva/sclera: Conjunctivae normal.  Cardiovascular:     Rate and Rhythm: Normal rate.  Pulmonary:     Effort: Pulmonary effort is normal.   Neurological:     Mental Status: She is alert.  Psychiatric:        Mood and Affect: Mood normal.        Behavior: Behavior normal.        Thought Content: Thought content normal.     UC Treatments / Results  Labs (all labs ordered are listed, but only abnormal results are displayed) Labs Reviewed - No data to display  EKG   Radiology No results found.  Procedures Procedures (including critical care time)  Medications Ordered in UC Medications - No data to display  Initial Impression / Assessment and Plan / UC Course  I have reviewed the triage vital signs and the nursing notes.  Pertinent labs & imaging results that were available during my care of the patient were reviewed by me and considered in my medical decision making (see chart for details).  Antibiotic prescribed for suspected dental abscess.  Recommended follow-up if symptoms fail to improve or worsen anyway.  Final Clinical Impressions(s) / UC Diagnoses   Final diagnoses:  Abscessed tooth   Discharge Instructions   None    ED Prescriptions     Medication Sig Dispense Auth. Provider   amoxicillin (AMOXIL) 500 MG capsule Take 1 capsule (500 mg total) by mouth 3 (three) times daily. 21 capsule Francene Finders, PA-C      PDMP not reviewed this encounter.   Francene Finders, PA-C 10/03/21 1114

## 2021-10-03 NOTE — ED Triage Notes (Signed)
Pt c/o rt upper tooth abscess from a broken tooth x5 days.

## 2021-11-14 ENCOUNTER — Ambulatory Visit: Payer: Commercial Managed Care - PPO | Admitting: Family Medicine

## 2021-11-22 ENCOUNTER — Ambulatory Visit
Admission: EM | Admit: 2021-11-22 | Discharge: 2021-11-22 | Disposition: A | Discharge status: Home / Self Care | Payer: Commercial Managed Care - PPO | Attending: Internal Medicine | Admitting: Internal Medicine

## 2021-11-22 ENCOUNTER — Other Ambulatory Visit: Payer: Self-pay

## 2021-11-22 ENCOUNTER — Encounter: Payer: Self-pay | Admitting: Emergency Medicine

## 2021-11-22 DIAGNOSIS — J069 Acute upper respiratory infection, unspecified: Secondary | ICD-10-CM

## 2021-11-22 DIAGNOSIS — J029 Acute pharyngitis, unspecified: Secondary | ICD-10-CM

## 2021-11-22 LAB — POCT INFLUENZA A/B
Influenza A, POC: NEGATIVE
Influenza B, POC: NEGATIVE

## 2021-11-22 LAB — POCT RAPID STREP A (OFFICE): Rapid Strep A Screen: NEGATIVE

## 2021-11-22 MED ORDER — BENZONATATE 100 MG PO CAPS: 100.0000 mg | ORAL_CAPSULE | Freq: Three times a day (TID) | ORAL | 0 refills | Status: DC | PRN

## 2021-11-22 MED ORDER — FLUTICASONE PROPIONATE 50 MCG/ACT NA SUSP: 1.0000 | Freq: Every day | NASAL | 0 refills | Status: DC

## 2021-11-22 MED ORDER — CETIRIZINE HCL 10 MG PO TABS: 10.0000 mg | ORAL_TABLET | Freq: Every day | ORAL | 0 refills | Status: DC

## 2021-11-22 NOTE — ED Provider Notes (Signed)
[facility]    CSN: [id number] Arrival date & time: 11/22/21  1102      History   Chief Complaint Chief Complaint  Patient presents with   Influenza    HPI Brandi Morris is a 46 y.o. female.   Patient presents with 4-day history of fever, generalized body aches, sore throat, nonproductive cough, nausea without vomiting, diarrhea.  Denies any known sick contacts.  Denies any known fevers.  Denies chest pain, shortness of breath, abdominal pain.  Has been taking Tylenol and ibuprofen at home for symptoms with minimal improvement.   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    Patient Active Problem List   Diagnosis Date Noted   Hyperlipidemia 07/30/2007   Anxiety and depression 07/30/2007   Migraine 07/30/2007   ALLERGIC RHINITIS 07/30/2007   Asthma 07/30/2007   INTERSTITIAL CYSTITIS 07/30/2007   INSOMNIA 07/30/2007    Past Surgical History:  Procedure Laterality Date   ABDOMINAL HYSTERECTOMY     ABLATION     CERVICAL BIOPSY  W/ LOOP ELECTRODE EXCISION     CHOLECYSTECTOMY     COLPOSCOPY     CYSTOSTOMY W/ BLADDER BIOPSY     KNEE SURGERY     LEEP     RHINOPLASTY     TENDON REPAIR Left 07/26/2015   Procedure: REPAIR WITH RECONSTRUCTION LEFT THUMB;  Surgeon: Charlotte Crumb, MD;  Location: [place];  Service: Orthopedics;  Laterality: Left;   TONSILLECTOMY     TUBAL LIGATION      OB History     Gravida  5   Para  3   Term  3   Preterm      AB  2   Living  3      SAB  2   IAB      Ectopic      Multiple      Live Births               Home Medications    Prior to Admission medications   Medication Sig Start Date End Date Taking? Authorizing Provider  benzonatate (TESSALON) 100 MG capsule Take 1 capsule (100 mg  total) by mouth every 8 (eight) hours as needed for cough. 11/22/21  Yes Shelanda Duvall, Michele Rockers, FNP  cetirizine (ZYRTEC) 10 MG tablet Take 1 tablet (10 mg total) by mouth daily for 10 days. 11/22/21 12/02/21 Yes Luciana Cammarata, Michele Rockers, FNP  fluticasone (FLONASE) 50 MCG/ACT nasal spray Place 1 spray into both nostrils daily for 3 days. 11/22/21 11/25/21 Yes Bradden Tadros, Michele Rockers, FNP  albuterol (PROVENTIL HFA;VENTOLIN HFA) 108 (90 BASE) MCG/ACT inhaler Inhale 1-2 puffs into the lungs every 6 (six) hours as needed for wheezing or shortness of breath. 07/02/15   [place]en Circle, FNP  amoxicillin (AMOXIL) 500 MG capsule Take 1 capsule (500 mg total) by mouth 3 (three) times daily. 10/03/21   Francene Finders, PA-C  aspirin-acetaminophen-caffeine (EXCEDRIN MIGRAINE) [phone] MG tablet Take 4 tablets by mouth every 6 (six) hours as needed for headache.     [provider]  CAMILA 0.35 MG tablet Take 1 tablet by mouth every evening.  10/02/18   [provider]  diphenhydrAMINE (BENADRYL) 25 MG  tablet Take 50 mg by mouth every 6 (six) hours as needed for itching or allergies.     [provider]  EPINEPHrine (EPIPEN 2-PAK) 0.3 mg/0.3 mL IJ SOAJ injection Inject 0.3 mg into the muscle once as needed (for severe allergic reaction).     [provider]  gabapentin (NEURONTIN) 300 MG capsule gabapentin 300 mg capsule  TAKE 1 CAPSULE BY MOUTH AT BEDTIME FOR HOT FLASH PREVENTION    [provider]  meloxicam (MOBIC) 7.5 MG tablet meloxicam 7.5 mg tablet  TAKE 1 TABLET BY MOUTH TWICE A DAY WITH FOOD    [provider]  predniSONE (DELTASONE) 20 MG tablet 3 tabs po day one, then 2 po daily x 4 days 12/25/18   Julianne Rice, MD  sulfamethoxazole-trimethoprim (BACTRIM DS) 800-160 MG tablet Bactrim DS 800 mg-160 mg tablet  Take One tab PO BID until complete    [provider]    Family History Family History  Problem Relation Age of Onset   Heart disease Mother    COPD Mother     Diabetes Father    Hypertension Father    Stroke Father    Heart disease Father    Breast cancer Maternal Grandmother    Cancer Maternal Grandfather        melanoma   Heart disease Paternal Grandmother    Stroke Paternal Grandmother    Heart attack Paternal Grandmother    Heart disease Paternal Grandfather    Stroke Paternal Grandfather    Heart attack Paternal Grandfather     Social History Social History   Tobacco Use   Smoking status: Every Day    Packs/day: 1.00    Years: 25.00    Pack years: 25.00    Types: Cigarettes   Smokeless tobacco: Never  Vaping Use   Vaping Use: Never used  Substance Use Topics   Alcohol use: Not Currently   Drug use: No     Allergies   Bee venom, Flexeril [cyclobenzaprine], Lavender oil, Robaxin [methocarbamol], Amitriptyline, Entex, Singulair [montelukast sodium], Etodolac, Iodine, Mushroom extract complex, and Shellfish allergy   Review of Systems Review of Systems Per HPI  Physical Exam Triage Vital Signs ED Triage Vitals  Enc Vitals Group     BP 11/22/21 1333 127/85     Pulse Rate 11/22/21 1333 (!) 110     Resp 11/22/21 1333 18     Temp 11/22/21 1333 98.1 F (36.7 C)     Temp Source 11/22/21 1333 Oral     SpO2 11/22/21 1333 96 %     Weight --      Height --      Head Circumference --      Peak Flow --      Pain Score 11/22/21 1335 4     Pain Loc --      Pain Edu? --      Excl. in [place]? --    No data found.  Updated Vital Signs BP 127/85 (BP Location: Left Arm)    Pulse (!) 110    Temp 98.1 F (36.7 C) (Oral)    Resp 18    LMP 03/06/2007    SpO2 96%   Visual Acuity Right Eye Distance:   Left Eye Distance:   Bilateral Distance:    Right Eye Near:   Left Eye Near:    Bilateral Near:     Physical Exam Constitutional:      General: She is not in acute distress.  Appearance: Normal appearance. She is not toxic-appearing or diaphoretic.  HENT:     Head: Normocephalic and atraumatic.     Right Ear:  Tympanic membrane and ear canal normal.     Left Ear: Tympanic membrane and ear canal normal.     Nose: Congestion present.     Mouth/Throat:     Mouth: Mucous membranes are moist.     Pharynx: Posterior oropharyngeal erythema present.  Eyes:     Extraocular Movements: Extraocular movements intact.     Conjunctiva/sclera: Conjunctivae normal.     Pupils: Pupils are equal, round, and reactive to light.  Cardiovascular:     Rate and Rhythm: Normal rate and regular rhythm.     Pulses: Normal pulses.     Heart sounds: Normal heart sounds.  Pulmonary:     Effort: Pulmonary effort is normal. No respiratory distress.     Breath sounds: Normal breath sounds. No stridor. No wheezing, rhonchi or rales.  Abdominal:     General: Abdomen is flat. Bowel sounds are normal.     Palpations: Abdomen is soft.  Musculoskeletal:        General: Normal range of motion.     Cervical back: Normal range of motion.  Skin:    General: Skin is warm and dry.  Neurological:     General: No focal deficit present.     Mental Status: She is alert and oriented to person, place, and time. Mental status is at baseline.  Psychiatric:        Mood and Affect: Mood normal.        Behavior: Behavior normal.     UC Treatments / Results  Labs (all labs ordered are listed, but only abnormal results are displayed) Labs Reviewed  NOVEL CORONAVIRUS, NAA  CULTURE, GROUP A STREP [facility])  POCT INFLUENZA A/B  POCT RAPID STREP A (OFFICE)    EKG   Radiology No results found.  Procedures Procedures (including critical care time)  Medications Ordered in UC Medications - No data to display  Initial Impression / Assessment and Plan / UC Course  I have reviewed the triage vital signs and the nursing notes.  Pertinent labs & imaging results that were available during my care of the patient were reviewed by me and considered in my medical decision making (see chart for details).     Patient presents with symptoms  likely from a viral upper respiratory infection. Differential includes bacterial pneumonia, sinusitis, allergic rhinitis, COVID-19, flu. Do not suspect underlying cardiopulmonary process. Symptoms seem unlikely related to ACS, CHF or COPD exacerbations, pneumonia, pneumothorax. Patient is nontoxic appearing and not in need of emergent medical intervention.  Rapid flu and strep test were negative.  Throat culture and COVID-19 viral swab are pending.  Recommended symptom control with over the counter medications: Daily oral anti-histamine, Oral decongestant or IN corticosteroid, saline irrigations, cepacol lozenges, Robitussin, Delsym, honey tea.  Patient sent prescriptions.  Return if symptoms fail to improve in 1-2 weeks or you develop shortness of breath, chest pain, severe headache. Patient states understanding and is agreeable.  Discharged with PCP followup.  Final Clinical Impressions(s) / UC Diagnoses   Final diagnoses:  Viral upper respiratory tract infection with cough  Sore throat     Discharge Instructions      It appears that you have a viral upper respiratory infection that should resolve in the next few days with symptomatic treatment.  Your flu test was negative.  Rapid strep was negative.  COVID-19  test is pending.  You have been prescribed 3 medications to help alleviate symptoms.  Please do not take cetirizine and Benadryl together.     ED Prescriptions     Medication Sig Dispense Auth. Provider   cetirizine (ZYRTEC) 10 MG tablet Take 1 tablet (10 mg total) by mouth daily for 10 days. 30 tablet [place], [place] E, [place]   fluticasone [facility]) 50 MCG/ACT nasal spray Place 1 spray into both nostrils daily for 3 days. 16 g Rosselyn Martha, Hildred Alamin E, [place]   benzonatate (TESSALON) 100 MG capsule Take 1 capsule (100 mg total) by mouth every 8 (eight) hours as needed for cough. 21 capsule [place], Michele Rockers, [place]      PDMP not reviewed this encounter.   Teodora Medici, [place] 11/22/21 1426

## 2021-11-22 NOTE — ED Triage Notes (Signed)
Monday began with fever, generalized body aches, sore throat, cough, nausea, diarrhea. Says this is one of the sickest she's ever felt. Treating with tylenol and ibuprofen at home

## 2021-11-22 NOTE — Discharge Instructions (Signed)
It appears that you have a viral upper respiratory infection that should resolve in the next few days with symptomatic treatment.  Your flu test was negative.  Rapid strep was negative.  COVID-19 test is pending.  You have been prescribed 3 medications to help alleviate symptoms.  Please do not take cetirizine and Benadryl together.

## 2021-11-23 LAB — NOVEL CORONAVIRUS, NAA: SARS-CoV-2, NAA: NOT DETECTED

## 2021-11-23 LAB — SARS-COV-2, NAA 2 DAY TAT

## 2021-11-25 LAB — CULTURE, GROUP A STREP (THRC)

## 2021-12-05 ENCOUNTER — Ambulatory Visit
Admission: RE | Admit: 2021-12-05 | Discharge: 2021-12-05 | Disposition: A | Discharge status: Home / Self Care | Payer: Commercial Managed Care - PPO | Source: Ambulatory Visit | Attending: Medical Oncology | Admitting: Medical Oncology

## 2021-12-05 ENCOUNTER — Ambulatory Visit (INDEPENDENT_AMBULATORY_CARE_PROVIDER_SITE_OTHER): Payer: Commercial Managed Care - PPO

## 2021-12-05 ENCOUNTER — Other Ambulatory Visit: Payer: Self-pay

## 2021-12-05 VITALS — BP 113/80 | HR 103 | Temp 98.3°F | Resp 18

## 2021-12-05 DIAGNOSIS — R509 Fever, unspecified: Secondary | ICD-10-CM

## 2021-12-05 DIAGNOSIS — J019 Acute sinusitis, unspecified: Secondary | ICD-10-CM | POA: Diagnosis not present

## 2021-12-05 DIAGNOSIS — B9689 Other specified bacterial agents as the cause of diseases classified elsewhere: Secondary | ICD-10-CM

## 2021-12-05 DIAGNOSIS — M791 Myalgia, unspecified site: Secondary | ICD-10-CM

## 2021-12-05 DIAGNOSIS — J4521 Mild intermittent asthma with (acute) exacerbation: Secondary | ICD-10-CM

## 2021-12-05 DIAGNOSIS — F17200 Nicotine dependence, unspecified, uncomplicated: Secondary | ICD-10-CM

## 2021-12-05 DIAGNOSIS — R059 Cough, unspecified: Secondary | ICD-10-CM | POA: Diagnosis not present

## 2021-12-05 MED ORDER — ALBUTEROL SULFATE HFA 108 (90 BASE) MCG/ACT IN AERS: 1.0000 | INHALATION_SPRAY | Freq: Four times a day (QID) | RESPIRATORY_TRACT | 0 refills | Status: DC | PRN

## 2021-12-05 MED ORDER — AMOXICILLIN-POT CLAVULANATE 875-125 MG PO TABS: 1.0000 | ORAL_TABLET | Freq: Two times a day (BID) | ORAL | 0 refills | Status: DC

## 2021-12-05 MED ORDER — PREDNISONE 10 MG (21) PO TBPK
ORAL_TABLET | Freq: Every day | ORAL | 0 refills | Status: DC
Start: 2021-12-05 — End: 2023-03-05

## 2021-12-05 MED ORDER — AZITHROMYCIN 250 MG PO TABS
250.0000 mg | ORAL_TABLET | Freq: Every day | ORAL | 0 refills | Status: DC
Start: 2021-12-05 — End: 2023-04-23

## 2021-12-05 MED ORDER — BENZONATATE 100 MG PO CAPS
100.0000 mg | ORAL_CAPSULE | Freq: Three times a day (TID) | ORAL | 0 refills | Status: DC
Start: 2021-12-05 — End: 2023-04-23

## 2021-12-05 NOTE — ED Triage Notes (Signed)
Pt presents with cough, body aches, ST, fever, and HA x 3 weeks.

## 2021-12-05 NOTE — ED Provider Notes (Signed)
[facility]    CSN: [id number] Arrival date & time: 12/05/21  1355      History   Chief Complaint Chief Complaint  Patient presents with   Cough   Headache   Generalized Body Aches   Nasal Congestion   Fever    HPI Brandi Morris is a 46 y.o. female.   HPI  Cold Symptoms: Patient reports that they have had symptoms of cough, headache, body aches, nasal congestion, fever for the past 3 weeks. Symptoms worsening as she now cannot take a deep breath without a coughing fit and is having sinus congestion with bloody debris. Sputum is brown/green. They deny SOB, chest pain, fever or vomiting. They have tried theraflu for symptoms. No known sick contacts.    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    Patient Active Problem List   Diagnosis Date Noted   Hyperlipidemia 07/30/2007   Anxiety and depression 07/30/2007   Migraine 07/30/2007   ALLERGIC RHINITIS 07/30/2007   Asthma 07/30/2007   INTERSTITIAL CYSTITIS 07/30/2007   INSOMNIA 07/30/2007    Past Surgical History:  Procedure Laterality Date   ABDOMINAL HYSTERECTOMY     ABLATION     CERVICAL BIOPSY  W/ LOOP ELECTRODE EXCISION     CHOLECYSTECTOMY     COLPOSCOPY     CYSTOSTOMY W/ BLADDER BIOPSY     KNEE SURGERY     LEEP     RHINOPLASTY     TENDON REPAIR Left 07/26/2015   Procedure: REPAIR WITH RECONSTRUCTION LEFT THUMB;  Surgeon: Charlotte Crumb, MD;  Location: [place];  Service: Orthopedics;  Laterality: Left;   TONSILLECTOMY     TUBAL LIGATION      OB History     Gravida  5   Para  3   Term  3   Preterm      AB  2   Living  3      SAB  2   IAB      Ectopic      Multiple      Live Births               Home Medications    Prior to Admission medications   Medication Sig  Start Date End Date Taking? Authorizing Provider  albuterol (PROVENTIL HFA;VENTOLIN HFA) 108 (90 BASE) MCG/ACT inhaler Inhale 1-2 puffs into the lungs every 6 (six) hours as needed for wheezing or shortness of breath. 07/02/15  Yes Golden Circle, FNP  diphenhydrAMINE (BENADRYL) 25 MG tablet Take 50 mg by mouth every 6 (six) hours as needed for itching or allergies.    Yes [provider]  amoxicillin (AMOXIL) 500 MG capsule Take 1 capsule (500 mg total) by mouth 3 (three) times daily. 10/03/21   Francene Finders, PA-C  aspirin-acetaminophen-caffeine (EXCEDRIN MIGRAINE) [phone] MG tablet Take 4 tablets by mouth every 6 (six) hours as needed for headache.     [provider]  benzonatate (TESSALON) 100 MG capsule Take 1 capsule (100 mg total) by mouth every 8 (eight) hours as needed for cough. 11/22/21   [place], Michele Rockers, FNP  CAMILA 0.35 MG tablet Take 1 tablet by mouth every evening.  10/02/18  [provider]  cetirizine (ZYRTEC) 10 MG tablet Take 1 tablet (10 mg total) by mouth daily for 10 days. 11/22/21 12/02/21  Teodora Medici, FNP  EPINEPHrine (EPIPEN 2-PAK) 0.3 mg/0.3 mL IJ SOAJ injection Inject 0.3 mg into the muscle once as needed (for severe allergic reaction).     [provider]  fluticasone (FLONASE) 50 MCG/ACT nasal spray Place 1 spray into both nostrils daily for 3 days. 11/22/21 11/25/21  Teodora Medici, FNP  gabapentin (NEURONTIN) 300 MG capsule gabapentin 300 mg capsule  TAKE 1 CAPSULE BY MOUTH AT BEDTIME FOR HOT FLASH PREVENTION    [provider]  meloxicam (MOBIC) 7.5 MG tablet meloxicam 7.5 mg tablet  TAKE 1 TABLET BY MOUTH TWICE A DAY WITH FOOD    [provider]  predniSONE (DELTASONE) 20 MG tablet 3 tabs po day one, then 2 po daily x 4 days 12/25/18   Julianne Rice, MD  sulfamethoxazole-trimethoprim (BACTRIM DS) 800-160 MG tablet Bactrim DS 800 mg-160 mg tablet  Take One tab PO BID until complete    [provider]     Family History Family History  Problem Relation Age of Onset   Heart disease Mother    COPD Mother    Diabetes Father    Hypertension Father    Stroke Father    Heart disease Father    Breast cancer Maternal Grandmother    Cancer Maternal Grandfather        melanoma   Heart disease Paternal Grandmother    Stroke Paternal Grandmother    Heart attack Paternal Grandmother    Heart disease Paternal Grandfather    Stroke Paternal Grandfather    Heart attack Paternal Grandfather     Social History Social History   Tobacco Use   Smoking status: Every Day    Packs/day: 1.00    Years: 25.00    Pack years: 25.00    Types: Cigarettes   Smokeless tobacco: Never  Vaping Use   Vaping Use: Never used  Substance Use Topics   Alcohol use: Not Currently   Drug use: No     Allergies   Bee venom, Flexeril [cyclobenzaprine], Lavender oil, Robaxin [methocarbamol], Amitriptyline, Entex, Singulair [montelukast sodium], Etodolac, Iodine, Mushroom extract complex, and Shellfish allergy   Review of Systems Review of Systems  As stated above in HPI Physical Exam Triage Vital Signs ED Triage Vitals  Enc Vitals Group     BP 12/05/21 1414 113/80     Pulse Rate 12/05/21 1414 (!) 103     Resp 12/05/21 1414 18     Temp 12/05/21 1414 98.3 F (36.8 C)     Temp Source 12/05/21 1414 Oral     SpO2 12/05/21 1414 96 %     Weight --      Height --      Head Circumference --      Peak Flow --      Pain Score 12/05/21 1412 5     Pain Loc --      Pain Edu? --      Excl. in [place]? --    No data found.  Updated Vital Signs BP 113/80 (BP Location: Right Arm)    Pulse (!) 103    Temp 98.3 F (36.8 C) (Oral)    Resp 18    LMP 03/06/2007    SpO2 96%   Physical Exam Vitals and nursing note reviewed.  Constitutional:      General: She is not in  acute distress.    Appearance: Normal appearance. She is not ill-appearing, toxic-appearing or diaphoretic.  HENT:     Head: Normocephalic and  atraumatic.     Right Ear: Tympanic membrane normal.     Left Ear: Tympanic membrane normal.     Nose: Congestion (maxillary) and rhinorrhea present.     Mouth/Throat:     Mouth: Mucous membranes are moist.     Pharynx: Oropharynx is clear. No oropharyngeal exudate or posterior oropharyngeal erythema.  Eyes:     General:        Right eye: No discharge.        Left eye: No discharge.     Extraocular Movements: Extraocular movements intact.     Conjunctiva/sclera: Conjunctivae normal.     Pupils: Pupils are equal, round, and reactive to light.  Cardiovascular:     Rate and Rhythm: Normal rate and regular rhythm.     Heart sounds: Normal heart sounds.  Pulmonary:     Effort: Pulmonary effort is normal. No respiratory distress.     Breath sounds: Normal breath sounds. No stridor. No wheezing or rhonchi.     Comments: Decreased breath sounds throughout with high pitched wheeze like cough with deep breaths  Abdominal:     Palpations: Abdomen is soft.  Musculoskeletal:     Cervical back: Normal range of motion and neck supple.  Lymphadenopathy:     Cervical: No cervical adenopathy.  Skin:    General: Skin is warm.     Coloration: Skin is not jaundiced.     Findings: No erythema or rash.  Neurological:     Mental Status: She is alert and oriented to person, place, and time.     UC Treatments / Results  Labs (all labs ordered are listed, but only abnormal results are displayed) Labs Reviewed - No data to display  EKG   Radiology No results found.  Procedures Procedures (including critical care time)  Medications Ordered in UC Medications - No data to display  Initial Impression / Assessment and Plan / UC Course  I have reviewed the triage vital signs and the nursing notes.  Pertinent labs & imaging results that were available during my care of the patient were reviewed by me and considered in my medical decision making (see chart for details).     New. No pneumonia  seen on x ray however given smoking status, history and examination I am going to cover for atypicals with azithromycin along with prednisone, tessalon, albuterol and Augmentin for her sinusitis (as azithromycin is no longer very effective at this).  Final Clinical Impressions(s) / UC Diagnoses   Final diagnoses:  None   Discharge Instructions   None    ED Prescriptions   None    PDMP not reviewed this encounter.   Hughie Closs, [place] 12/05/21 1507

## 2022-07-23 ENCOUNTER — Encounter (HOSPITAL_COMMUNITY): Payer: Self-pay | Admitting: Emergency Medicine

## 2022-07-23 ENCOUNTER — Other Ambulatory Visit: Payer: Self-pay

## 2022-07-23 ENCOUNTER — Emergency Department (HOSPITAL_COMMUNITY): Payer: Self-pay

## 2022-07-23 ENCOUNTER — Emergency Department (HOSPITAL_COMMUNITY)
Admission: EM | Admit: 2022-07-23 | Discharge: 2022-07-24 | Disposition: A | Discharge status: Home / Self Care | Payer: Self-pay | Attending: Emergency Medicine | Admitting: Emergency Medicine

## 2022-07-23 DIAGNOSIS — M5481 Occipital neuralgia: Secondary | ICD-10-CM | POA: Insufficient documentation

## 2022-07-23 DIAGNOSIS — G4489 Other headache syndrome: Secondary | ICD-10-CM | POA: Diagnosis not present

## 2022-07-23 DIAGNOSIS — G43909 Migraine, unspecified, not intractable, without status migrainosus: Secondary | ICD-10-CM | POA: Diagnosis not present

## 2022-07-23 DIAGNOSIS — R1111 Vomiting without nausea: Secondary | ICD-10-CM | POA: Diagnosis not present

## 2022-07-23 NOTE — ED Provider Triage Note (Signed)
Emergency Medicine Provider Triage Evaluation Note  Brandi Morris , a 46 y.o. female  was evaluated in triage.  Pt complains of headache.  Patient reports headache began about 3:00 this morning.  History of migraines but states this feels different.  She reports pain that shoots from the left side of her neck to the left side of her head and down her left arm with palpation.  Patient reports associated photophobia, nausea.  Patient is concerned this is something more than a migraine and requesting CT imaging..  Review of Systems  Positive: Headache, photophobia, nausea Negative: Fever, chest pain, vision changes, paresthesias  Physical Exam  BP 111/74   Pulse 71   Temp 98.1 F (36.7 C) (Oral)   Resp 16   LMP 03/03/2007   SpO2 97%  Gen:   Awake, no distress   Resp:  Normal effort  MSK:   Moves extremities without difficulty  Other:  The patient is alert, attentive, and oriented x 3. Speech is clear. Cranial nerve II-VII grossly intact. Negative pronator drift. Sensation intact. Strength 5/5 in all extremities. Rapid alternating movement and fine finger movements intact.  Posture and gait normal.   Medical Decision Making  Medically screening exam initiated at 7:37 PM.  Appropriate orders placed.  Brandi Morris was informed that the remainder of the evaluation will be completed by another provider, this initial triage assessment does not replace that evaluation, and the importance of remaining in the ED until their evaluation is complete.  46 year old history of migraines presents for headache.  Patient without focal neurological deficit.  Presentation not consistent meningitis.  CT imaging is pending at this time.  Do not feel that blood work is indicated.   Doristine Devoid, PA-C 07/23/22 1940

## 2022-07-23 NOTE — ED Triage Notes (Signed)
Hx migraines reports this is the worst she has had.  Reports took 12 motrin, 7 tylenol with no relief.  Reports she is allergic to smells.  Always had vision problems. And a place in the back of her head is sore.  Marland Kitchen

## 2022-07-24 MED ORDER — LIDOCAINE HCL 2 % IJ SOLN
10.0000 mL | Freq: Once | INTRAMUSCULAR | Status: AC
  Administered 2022-07-24: 200 mg
  Filled 2022-07-24: qty 20

## 2022-07-24 NOTE — ED Provider Notes (Signed)
[facility] Provider Note   CSN: [id number] Arrival date & time: 07/23/22  1824     History  Chief Complaint  Patient presents with   Migraine   MIgraine    Brandi Morris is a 46 y.o. female.  46 year old female with a history of migraines for 30 years who presents the ER today with head pain.  She states is different than her normal migraines been going on for few days.  Its in the occipital area on the left side radiates up towards her temple area.  She has no vision changes.  She states that it hurts when she touches it in that area.  She states that she is worried that she might have a tumor she has never had a headache this before.  No neurologic changes otherwise.  No recent infections.  No trauma.   Migraine       Home Medications Prior to Admission medications   Medication Sig Start Date End Date Taking? Authorizing Provider  albuterol (PROVENTIL HFA;VENTOLIN HFA) 108 (90 BASE) MCG/ACT inhaler Inhale 1-2 puffs into the lungs every 6 (six) hours as needed for wheezing or shortness of breath. 07/02/15   Golden Circle, FNP  albuterol (VENTOLIN HFA) 108 (90 Base) MCG/ACT inhaler Inhale 1-2 puffs into the lungs every 6 (six) hours as needed for wheezing or shortness of breath. 12/05/21   Hughie Closs, PA-C  amoxicillin-clavulanate (AUGMENTIN) 875-125 MG tablet Take 1 tablet by mouth every 12 (twelve) hours. 12/05/21   Hughie Closs, PA-C  aspirin-acetaminophen-caffeine (EXCEDRIN MIGRAINE) [phone] MG tablet Take 4 tablets by mouth every 6 (six) hours as needed for headache.     [provider]  azithromycin (ZITHROMAX) 250 MG tablet Take 1 tablet (250 mg total) by mouth daily. Take first 2 tablets together, then 1 every day until finished. 12/05/21   Hughie Closs, PA-C  benzonatate (TESSALON) 100 MG capsule Take 1 capsule (100 mg total) by mouth every 8 (eight) hours as needed for cough. 11/22/21   Teodora Medici, FNP  benzonatate (TESSALON) 100 MG capsule Take 1 capsule (100 mg total) by mouth every 8 (eight) hours. 12/05/21   Hughie Closs, PA-C  CAMILA 0.35 MG tablet Take 1 tablet by mouth every evening.  10/02/18   [provider]  cetirizine (ZYRTEC) 10 MG tablet Take 1 tablet (10 mg total) by mouth daily for 10 days. 11/22/21 12/02/21  Teodora Medici, FNP  diphenhydrAMINE (BENADRYL) 25 MG tablet Take 50 mg by mouth every 6 (six) hours as needed for itching or allergies.     [provider]  EPINEPHrine (EPIPEN 2-PAK) 0.3 mg/0.3 mL IJ SOAJ injection Inject 0.3 mg into the muscle once as needed (for severe allergic reaction).     [provider]  fluticasone (FLONASE) 50 MCG/ACT nasal spray Place 1 spray into both nostrils daily for 3 days. 11/22/21 11/25/21  Teodora Medici, FNP  gabapentin (NEURONTIN) 300 MG capsule gabapentin 300 mg capsule  TAKE 1 CAPSULE BY MOUTH AT BEDTIME FOR HOT FLASH PREVENTION    [provider]  meloxicam (MOBIC) 7.5 MG tablet meloxicam 7.5 mg tablet  TAKE 1 TABLET BY MOUTH TWICE A DAY WITH FOOD    [provider]  predniSONE (DELTASONE) 20 MG tablet 3 tabs po day one, then 2 po daily x 4 days 12/25/18   Julianne Rice, MD  predniSONE (STERAPRED UNI-PAK 21 TAB) 10 MG (21) TBPK tablet Take by  mouth daily. Take 6 tabs by mouth daily  for 2 days, then 5 tabs for 2 days, then 4 tabs for 2 days, then 3 tabs for 2 days, 2 tabs for 2 days, then 1 tab by mouth daily for 2 days 12/05/21   Hughie Closs, PA-C      Allergies    Bee venom, Flexeril [cyclobenzaprine], Lavender oil, Robaxin [methocarbamol], Amitriptyline, Entex, Singulair [montelukast sodium], Etodolac, Iodine, Mushroom extract complex, and Shellfish allergy    Review of Systems   Review of Systems  Physical Exam Updated Vital Signs BP 110/81   Pulse 67   Temp 98.4 F (36.9 C) (Oral)   Resp 18   LMP 03/03/2007   SpO2 99%  Physical Exam Vitals and nursing note  reviewed.  Constitutional:      Appearance: She is well-developed.  HENT:     Head: Normocephalic and atraumatic.     Nose: No congestion or rhinorrhea.     Mouth/Throat:     Mouth: Mucous membranes are moist.     Pharynx: Oropharynx is clear.  Eyes:     Pupils: Pupils are equal, round, and reactive to light.  Cardiovascular:     Rate and Rhythm: Normal rate and regular rhythm.  Pulmonary:     Effort: No respiratory distress.     Breath sounds: No stridor.  Abdominal:     General: There is no distension.  Musculoskeletal:     Cervical back: Normal range of motion.     Comments: Tenderness just to the left of the occiput that radiates sharpshooting pain to left temple area.  No fluctuance, erythema or other evidence of infection.  Neurological:     Mental Status: She is alert.     Comments: No altered mental status, able to give full seemingly accurate history.  Face is symmetric, EOM's intact, pupils equal and reactive, vision intact, tongue and uvula midline without deviation. Upper and Lower extremity motor 5/5, intact pain perception in distal extremities, 2+ reflexes in biceps, patella and achilles tendons. Able to perform finger to nose normal with both hands. Walks without assistance or evident ataxia.       ED Results / Procedures / Treatments   Labs (all labs ordered are listed, but only abnormal results are displayed) Labs Reviewed - No data to display  EKG None  Radiology CT Head Wo Contrast  Result Date: 07/23/2022 CLINICAL DATA:  Headache. EXAM: CT HEAD WITHOUT CONTRAST TECHNIQUE: Contiguous axial images were obtained from the base of the skull through the vertex without intravenous contrast. RADIATION DOSE REDUCTION: This exam was performed according to the departmental dose-optimization program which includes automated exposure control, adjustment of the mA and/or kV according to patient size and/or use of iterative reconstruction technique. COMPARISON:  Head CT  dated 01/17/2014. FINDINGS: Brain: The ventricles and sulci are appropriate size for the patient's age. The gray-white matter discrimination is preserved. There is no acute intracranial hemorrhage. No mass effect or midline shift. No extra-axial fluid collection. Vascular: No hyperdense vessel or unexpected calcification. Skull: Normal. Negative for fracture or focal lesion. Sinuses/Orbits: Diffuse mucoperiosteal thickening of paranasal sinuses. No air-fluid level. Mastoid air cells are clear. Other: None IMPRESSION: 1. No acute intracranial pathology. 2. Chronic paranasal sinus disease. Electronically Signed   By: Anner Crete M.D.   On: 07/23/2022 21:13    Procedures .Nerve Block  Date/Time: 07/24/2022 1:58 AM  Performed by: Merrily Pew, MD Authorized by: Merrily Pew, MD   Consent:  Consent obtained:  Verbal   Consent given by:  Patient   Risks, benefits, and alternatives were discussed: yes     Risks discussed:  Allergic reaction, bleeding, intravenous injection, infection, nerve damage, pain, swelling and unsuccessful block   Alternatives discussed:  No treatment and alternative treatment Universal protocol:    Procedure explained and questions answered to patient or proxy's satisfaction: yes     Relevant documents present and verified: yes     Patient identity confirmed:  Verbally with patient Indications:    Indications:  Pain relief Location:    Body area:  Head   Head nerve:  Greater occipital   Laterality:  Left Pre-procedure details:    Skin preparation:  Chlorhexidine   Preparation: Patient was prepped and draped in usual sterile fashion   Skin anesthesia:    Skin anesthesia method:  Local infiltration   Local anesthetic:  Lidocaine 2% w/o epi Procedure details:    Block needle gauge:  25 G   Anesthetic injected:  Lidocaine 2% w/o epi   Steroid injected:  None   Additive injected:  None   Injection procedure:  Anatomic landmarks identified, anatomic landmarks  palpated, incremental injection, introduced needle and negative aspiration for blood   Paresthesia:  None Post-procedure details:    Dressing:  None   Outcome:  Anesthesia achieved   Procedure completion:  Tolerated well, no immediate complications     Medications Ordered in ED Medications  lidocaine (XYLOCAINE) 2 % (with pres) injection 200 mg (200 mg Infiltration Given 07/24/22 0133)    ED Course/ Medical Decision Making/ A&P                           Medical Decision Making Risk Prescription drug management.  Imaging ordered in triage is negative.  Symptoms seem consistent with occipital neuralgia we will attempt a nerve block.  If that does not work we will treat her with regular headache medications.  No evidence of abscess.  Almost complete resolution of occipital nerve pain.  CT head done and showed no intracranial abnormalities (independently viewed and interpreted by myself and radiology read reviewed).  Final Clinical Impression(s) / ED Diagnoses Final diagnoses:  Occipital neuralgia of left side    Rx / DC Orders ED Discharge Orders     None         Aayliah Rotenberry, Corene Cornea, MD 07/24/22 0200

## 2022-08-18 DIAGNOSIS — Z419 Encounter for procedure for purposes other than remedying health state, unspecified: Secondary | ICD-10-CM | POA: Diagnosis not present

## 2022-09-18 DIAGNOSIS — Z419 Encounter for procedure for purposes other than remedying health state, unspecified: Secondary | ICD-10-CM | POA: Diagnosis not present

## 2022-10-18 DIAGNOSIS — Z419 Encounter for procedure for purposes other than remedying health state, unspecified: Secondary | ICD-10-CM | POA: Diagnosis not present

## 2022-11-18 DIAGNOSIS — Z419 Encounter for procedure for purposes other than remedying health state, unspecified: Secondary | ICD-10-CM | POA: Diagnosis not present

## 2022-12-19 DIAGNOSIS — Z419 Encounter for procedure for purposes other than remedying health state, unspecified: Secondary | ICD-10-CM | POA: Diagnosis not present

## 2023-01-12 ENCOUNTER — Other Ambulatory Visit: Payer: Self-pay

## 2023-01-12 ENCOUNTER — Emergency Department
Admission: EM | Admit: 2023-01-12 | Discharge: 2023-01-12 | Disposition: A | Discharge status: Home / Self Care | Payer: Medicaid Other | Attending: Emergency Medicine | Admitting: Emergency Medicine

## 2023-01-12 DIAGNOSIS — R112 Nausea with vomiting, unspecified: Secondary | ICD-10-CM | POA: Insufficient documentation

## 2023-01-12 DIAGNOSIS — K047 Periapical abscess without sinus: Secondary | ICD-10-CM | POA: Insufficient documentation

## 2023-01-12 DIAGNOSIS — Z20822 Contact with and (suspected) exposure to covid-19: Secondary | ICD-10-CM | POA: Diagnosis not present

## 2023-01-12 DIAGNOSIS — R1110 Vomiting, unspecified: Secondary | ICD-10-CM | POA: Diagnosis not present

## 2023-01-12 LAB — URINALYSIS, ROUTINE W REFLEX MICROSCOPIC
Bilirubin Urine: NEGATIVE
Glucose, UA: NEGATIVE mg/dL
Ketones, ur: NEGATIVE mg/dL
Leukocytes,Ua: NEGATIVE
Nitrite: NEGATIVE
Protein, ur: 30 mg/dL — AB
Specific Gravity, Urine: 1.027 (ref 1.005–1.030)
pH: 5 (ref 5.0–8.0)

## 2023-01-12 LAB — CBC WITH DIFFERENTIAL/PLATELET
Abs Immature Granulocytes: 0.02 10*3/uL (ref 0.00–0.07)
Basophils Absolute: 0.1 10*3/uL (ref 0.0–0.1)
Basophils Relative: 1 %
Eosinophils Absolute: 0.4 10*3/uL (ref 0.0–0.5)
Eosinophils Relative: 4 %
HCT: 44 % (ref 36.0–46.0)
Hemoglobin: 15 g/dL (ref 12.0–15.0)
Immature Granulocytes: 0 %
Lymphocytes Relative: 29 %
Lymphs Abs: 2.4 10*3/uL (ref 0.7–4.0)
MCH: 29.7 pg (ref 26.0–34.0)
MCHC: 34.1 g/dL (ref 30.0–36.0)
MCV: 87.1 fL (ref 80.0–100.0)
Monocytes Absolute: 0.6 10*3/uL (ref 0.1–1.0)
Monocytes Relative: 7 %
Neutro Abs: 4.9 10*3/uL (ref 1.7–7.7)
Neutrophils Relative %: 59 %
Platelets: 279 10*3/uL (ref 150–400)
RBC: 5.05 MIL/uL (ref 3.87–5.11)
RDW: 12.7 % (ref 11.5–15.5)
WBC: 8.4 10*3/uL (ref 4.0–10.5)
nRBC: 0 % (ref 0.0–0.2)

## 2023-01-12 LAB — COMPREHENSIVE METABOLIC PANEL
ALT: 20 U/L (ref 0–44)
AST: 24 U/L (ref 15–41)
Albumin: 4 g/dL (ref 3.5–5.0)
Alkaline Phosphatase: 77 U/L (ref 38–126)
Anion gap: 12 (ref 5–15)
BUN: 12 mg/dL (ref 6–20)
CO2: 20 mmol/L — ABNORMAL LOW (ref 22–32)
Calcium: 9.1 mg/dL (ref 8.9–10.3)
Chloride: 105 mmol/L (ref 98–111)
Creatinine, Ser: 0.64 mg/dL (ref 0.44–1.00)
GFR, Estimated: >60 mL/min (ref >60)
Glucose, Bld: 133 mg/dL — ABNORMAL HIGH (ref 70–99)
Potassium: 3.7 mmol/L (ref 3.5–5.1)
Sodium: 137 mmol/L (ref 135–145)
Total Bilirubin: 0.5 mg/dL (ref 0.3–1.2)
Total Protein: 6.8 g/dL (ref 6.5–8.1)

## 2023-01-12 LAB — LIPASE, BLOOD: Lipase: 30 U/L (ref 11–51)

## 2023-01-12 LAB — RESP PANEL BY RT-PCR (RSV, FLU A&B, COVID)  RVPGX2
Influenza A by PCR: NEGATIVE
Influenza B by PCR: NEGATIVE
Resp Syncytial Virus by PCR: NEGATIVE
SARS Coronavirus 2 by RT PCR: NEGATIVE

## 2023-01-12 MED ORDER — ONDANSETRON 4 MG PO TBDP: 4.0000 mg | ORAL_TABLET | Freq: Three times a day (TID) | ORAL | 0 refills | Status: AC | PRN

## 2023-01-12 MED ORDER — AMOXICILLIN-POT CLAVULANATE 875-125 MG PO TABS: 1.0000 | ORAL_TABLET | Freq: Two times a day (BID) | ORAL | 0 refills | Status: AC

## 2023-01-12 NOTE — ED Triage Notes (Signed)
Pt presents to ER with c/o fever, and vomiting that started 2/22.  Pt also c/o foul smelling odor and dental pain from surgery that was done 2/20.  Pt states she has been around children who have had stomach virus.  Pt denies any abd pain.  Pt endorses some swelling to left upper gum line where she had some teeth removed.  Pt is otherwise A&O x4 and in NAD in triage.

## 2023-01-12 NOTE — ED Provider Notes (Addendum)
[facility] Provider Note    Event Date/Time   First MD Initiated Contact with Patient 01/12/23 2100     (approximate)   History   Fever, Dental Pain, and Emesis   HPI  Brandi Morris is a 47 y.o. female who comes in with fever, vomiting that started on 2/22.  Patient reports having dental pain and odor from a dental surgery on 2/20.  She also has some children have had a stomach virus she denies any abdominal pain.  She reports some episodes of vomiting as well.  She reports that she has been on Keflex antibiotic but still having the issues.  She has not followed back up yet with her dentist.  She does report some concerns for foul smell of urine as well. Denies any chest pain or SOB    Physical Exam   Triage Vital Signs: ED Triage Vitals  Enc Vitals Group     BP 01/12/23 2037 126/87     Pulse Rate 01/12/23 2037 (!) 102     Resp 01/12/23 2037 18     Temp 01/12/23 2037 98.6 F (37 C)     Temp Source 01/12/23 2037 Oral     SpO2 01/12/23 2037 99 %     Weight 01/12/23 2038 168 lb (76.2 kg)     Height 01/12/23 2038 '5\' 2"'$  (1.575 m)     Head Circumference --      Peak Flow --      Pain Score 01/12/23 2038 4     Pain Loc --      Pain Edu? --      Excl. in [place]? --     Most recent vital signs: Vitals:   01/12/23 2037  BP: 126/87  Pulse: (!) 102  Resp: 18  Temp: 98.6 F (37 C)  SpO2: 99%     General: Awake, no distress.  CV:  Good peripheral perfusion.  Resp:  Normal effort.  Abd:  No distention.  Soft and nontender.  No CVA tenderness Other:  Port incision with extraction noted in the upper portion with sutures still in place without any fluctuation noted.   ED Results / Procedures / Treatments   Labs (all labs ordered are listed, but only abnormal results are displayed) Labs Reviewed  URINALYSIS, ROUTINE W REFLEX MICROSCOPIC - Abnormal; Notable for the following components:      Result Value   Color, Urine YELLOW (*)    APPearance  HAZY (*)    Hgb urine dipstick MODERATE (*)    Protein, ur 30 (*)    Bacteria, UA RARE (*)    All other components within normal limits  CBC WITH DIFFERENTIAL/PLATELET  COMPREHENSIVE METABOLIC PANEL  LIPASE, BLOOD     PROCEDURES:  Critical Care performed: No  Procedures   MEDICATIONS ORDERED IN ED: Medications - No data to display   IMPRESSION / MDM / [place] / ED COURSE  I reviewed the triage vital signs and the nursing notes.   Patient's presentation is most consistent with acute presentation with potential threat to life or bodily function.   Suspect most likely pulpitis, infection do not see any obvious abscess arising from her tooth.  Will broaden her out from Keflex to Augmentin.  Also consider UTI, Electra abnormalities from the vomiting.  Abdomen soft nontender low suspicion for acute abdominal process  UA without evidence of UTI.  Does have some RBCs in it.  She reports history of kidney stones.  Denies any kidney stone symptoms currently and denies wanting CT imaging at this time.  CBC reassuring CMP reassuring lipase normal.   COVID and flu test were sent but pending    FINAL CLINICAL IMPRESSION(S) / ED DIAGNOSES   Final diagnoses:  Tooth infection  Nausea and vomiting, unspecified vomiting type     Rx / DC Orders   ED Discharge Orders          Ordered    ondansetron (ZOFRAN-ODT) 4 MG disintegrating tablet  Every 8 hours PRN        01/12/23 2134    amoxicillin-clavulanate (AUGMENTIN) 875-125 MG tablet  2 times daily        01/12/23 2134             Note:  This document was prepared using Dragon voice recognition software and may include unintentional dictation errors.   Vanessa Munford, MD 01/12/23 2135    Vanessa Walthall, MD 01/12/23 [phone]

## 2023-01-12 NOTE — Discharge Instructions (Addendum)
Discontinue the keflex start the Augmentin to help with possible tooth infection.  Return to the ER if develop a fluctuant swelling because this would be draining.  Do not see this at this time.  Please call your dental surgery to make a follow-up appointment as soon as possible and return to the ER for worsening symptoms or any other concerns

## 2023-01-17 DIAGNOSIS — Z419 Encounter for procedure for purposes other than remedying health state, unspecified: Secondary | ICD-10-CM | POA: Diagnosis not present

## 2023-02-17 DIAGNOSIS — Z419 Encounter for procedure for purposes other than remedying health state, unspecified: Secondary | ICD-10-CM | POA: Diagnosis not present

## 2023-03-03 ENCOUNTER — Encounter: Payer: Self-pay | Admitting: *Deleted

## 2023-03-05 ENCOUNTER — Ambulatory Visit
Admission: RE | Admit: 2023-03-05 | Discharge: 2023-03-05 | Disposition: A | Discharge status: Home / Self Care | Payer: 59 | Source: Ambulatory Visit | Attending: Urgent Care | Admitting: Urgent Care

## 2023-03-05 VITALS — BP 103/71 | HR 92 | Temp 98.8°F | Resp 18

## 2023-03-05 DIAGNOSIS — R0789 Other chest pain: Secondary | ICD-10-CM | POA: Diagnosis not present

## 2023-03-05 DIAGNOSIS — M5441 Lumbago with sciatica, right side: Secondary | ICD-10-CM | POA: Diagnosis not present

## 2023-03-05 LAB — POCT URINALYSIS DIP (MANUAL ENTRY)
Bilirubin, UA: NEGATIVE
Glucose, UA: NEGATIVE mg/dL
Ketones, POC UA: NEGATIVE mg/dL
Leukocytes, UA: NEGATIVE
Nitrite, UA: POSITIVE — AB
Protein Ur, POC: NEGATIVE mg/dL
Spec Grav, UA: 1.02 (ref 1.010–1.025)
Urobilinogen, UA: 0.2 E.U./dL
pH, UA: 7 (ref 5.0–8.0)

## 2023-03-05 MED ORDER — KETOROLAC TROMETHAMINE 30 MG/ML IJ SOLN
30.0000 mg | Freq: Once | INTRAMUSCULAR | Status: AC
  Administered 2023-03-05: 30 mg via INTRAMUSCULAR

## 2023-03-05 MED ORDER — PREDNISONE 20 MG PO TABS: ORAL_TABLET | ORAL | 0 refills | Status: AC

## 2023-03-05 NOTE — Discharge Instructions (Addendum)
Follow up here or with your primary care provider if your symptoms are worsening or not improving.     

## 2023-03-05 NOTE — ED Triage Notes (Signed)
Pt is having pain in her lower back when trying to walk or bend over. Pt think she may be having a kidney stone having, having dark colored urine and back pain. Has history of kidney stones. Pt states she is also having chest pain in center of chest and radiates to her right side of jaw and right arm that comes and goes. Feels like a heavy weight on her chest.

## 2023-03-05 NOTE — ED Provider Notes (Signed)
[facility]    CSN: [id number] Arrival date & time: 03/05/23  1416      History   Chief Complaint Chief Complaint  Patient presents with   Back Pain    Joint pain, vomiting, dizzy, light headed - Entered by patient   Chest Pain    HPI ATARA PATERSON is a 47 y.o. female.    Back Pain Associated symptoms: chest pain   Chest Pain Associated symptoms: back pain     Presents to urgent care with complaint of lower back pain when trying to walk or bend over.  She is concerned about possible renal calculi.  She endorses dark-colored urine and has history of kidney stones.  She also reports mid sternal chest pain which radiates to the right side of her jaw and right arm that is intermittent.  She describes it as a "heavy weight" on her chest.  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    Patient Active Problem List   Diagnosis Date Noted   Hyperlipidemia 07/30/2007   Anxiety and depression 07/30/2007   Migraine 07/30/2007   ALLERGIC RHINITIS 07/30/2007   Asthma 07/30/2007   INTERSTITIAL CYSTITIS 07/30/2007   INSOMNIA 07/30/2007    Past Surgical History:  Procedure Laterality Date   ABDOMINAL HYSTERECTOMY     ABLATION     CERVICAL BIOPSY  W/ LOOP ELECTRODE EXCISION     CHOLECYSTECTOMY     COLPOSCOPY     CYSTOSTOMY W/ BLADDER BIOPSY     KNEE SURGERY     LEEP     RHINOPLASTY     TENDON REPAIR Left 07/26/2015   Procedure: REPAIR WITH RECONSTRUCTION LEFT THUMB;  Surgeon: Dairl Ponder, MD;  Location: [facility] OR;  Service: Orthopedics;  Laterality: Left;   TONSILLECTOMY     TUBAL LIGATION      OB History     Gravida  5   Para  3   Term  3   Preterm      AB  2   Living  3      SAB  2   IAB      Ectopic      Multiple      Live Births                Home Medications    Prior to Admission medications   Medication Sig Start Date End Date Taking? Authorizing Provider  aspirin-acetaminophen-caffeine (EXCEDRIN MIGRAINE) [phone] MG tablet Take 4 tablets by mouth every 6 (six) hours as needed for headache.    Yes [provider]  diphenhydrAMINE (BENADRYL) 25 MG tablet Take 50 mg by mouth every 6 (six) hours as needed for itching or allergies.    Yes [provider]  albuterol (PROVENTIL HFA;VENTOLIN HFA) 108 (90 BASE) MCG/ACT inhaler Inhale 1-2 puffs into the lungs every 6 (six) hours as needed for wheezing or shortness of breath. 07/02/15   Veryl Speak, FNP  albuterol (VENTOLIN HFA) 108 (90 Base) MCG/ACT inhaler Inhale 1-2 puffs into the lungs every 6 (six) hours as needed for wheezing or shortness of breath. 12/05/21   Rushie Chestnut, PA-C  azithromycin (ZITHROMAX) 250 MG tablet Take 1 tablet (250 mg total) by mouth  daily. Take first 2 tablets together, then 1 every day until finished. 12/05/21   Rushie Chestnut, PA-C  benzonatate (TESSALON) 100 MG capsule Take 1 capsule (100 mg total) by mouth every 8 (eight) hours as needed for cough. 11/22/21   Gustavus Bryant, FNP  benzonatate (TESSALON) 100 MG capsule Take 1 capsule (100 mg total) by mouth every 8 (eight) hours. 12/05/21   Rushie Chestnut, PA-C  CAMILA 0.35 MG tablet Take 1 tablet by mouth every evening.  10/02/18   [provider]  cetirizine (ZYRTEC) 10 MG tablet Take 1 tablet (10 mg total) by mouth daily for 10 days. 11/22/21 12/02/21  Gustavus Bryant, FNP  EPINEPHrine (EPIPEN 2-PAK) 0.3 mg/0.3 mL IJ SOAJ injection Inject 0.3 mg into the muscle once as needed (for severe allergic reaction).     [provider]  fluticasone (FLONASE) 50 MCG/ACT nasal spray Place 1 spray into both nostrils daily for 3 days. 11/22/21 11/25/21  Gustavus Bryant, FNP  gabapentin (NEURONTIN) 300 MG capsule gabapentin 300 mg capsule  TAKE 1 CAPSULE BY MOUTH AT  BEDTIME FOR HOT FLASH PREVENTION    [provider]  meloxicam (MOBIC) 7.5 MG tablet meloxicam 7.5 mg tablet  TAKE 1 TABLET BY MOUTH TWICE A DAY WITH FOOD    [provider]  predniSONE (DELTASONE) 20 MG tablet 3 tabs po day one, then 2 po daily x 4 days 12/25/18   Loren Racer, MD  predniSONE (STERAPRED UNI-PAK 21 TAB) 10 MG (21) TBPK tablet Take by mouth daily. Take 6 tabs by mouth daily  for 2 days, then 5 tabs for 2 days, then 4 tabs for 2 days, then 3 tabs for 2 days, 2 tabs for 2 days, then 1 tab by mouth daily for 2 days 12/05/21   Rushie Chestnut, PA-C    Family History Family History  Problem Relation Age of Onset   Heart disease Mother    COPD Mother    Diabetes Father    Hypertension Father    Stroke Father    Heart disease Father    Breast cancer Maternal Grandmother    Cancer Maternal Grandfather        melanoma   Heart disease Paternal Grandmother    Stroke Paternal Grandmother    Heart attack Paternal Grandmother    Heart disease Paternal Grandfather    Stroke Paternal Grandfather    Heart attack Paternal Grandfather     Social History Social History   Tobacco Use   Smoking status: Every Day    Packs/day: 1.00    Years: 25.00    Additional pack years: 0.00    Total pack years: 25.00    Types: Cigarettes   Smokeless tobacco: Never  Vaping Use   Vaping Use: Never used  Substance Use Topics   Alcohol use: Not Currently   Drug use: No     Allergies   Bee venom, Flexeril [cyclobenzaprine], Lavender oil, Robaxin [methocarbamol], Amitriptyline, Entex, Singulair [montelukast sodium], Etodolac, Iodine, Mushroom extract complex, and Shellfish allergy   Review of Systems Review of Systems  Cardiovascular:  Positive for chest pain.  Musculoskeletal:  Positive for back pain.     Physical Exam Triage Vital Signs ED Triage Vitals  Enc Vitals Group     BP 03/05/23 1445 103/71     Pulse Rate 03/05/23 1445 92     Resp 03/05/23 1445 18      Temp 03/05/23 1445 98.8 F (37.1 C)  Temp Source 03/05/23 1445 Oral     SpO2 03/05/23 1445 96 %     Weight --      Height --      Head Circumference --      Peak Flow --      Pain Score 03/05/23 1453 9     Pain Loc --      Pain Edu? --      Excl. in [place]? --    No data found.  Updated Vital Signs BP 103/71 (BP Location: Left Arm)   Pulse 92   Temp 98.8 F (37.1 C) (Oral)   Resp 18   LMP 03/03/2007   SpO2 96%   Visual Acuity Right Eye Distance:   Left Eye Distance:   Bilateral Distance:    Right Eye Near:   Left Eye Near:    Bilateral Near:     Physical Exam Vitals reviewed.  Constitutional:      General: She is in acute distress.     Appearance: She is well-developed.  Skin:    General: Skin is warm and dry.  Neurological:     General: No focal deficit present.     Mental Status: She is alert and oriented to person, place, and time.      UC Treatments / Results  Labs (all labs ordered are listed, but only abnormal results are displayed) Labs Reviewed - No data to display  EKG   Radiology No results found.  Procedures Procedures (including critical care time)  Medications Ordered in UC Medications - No data to display  Initial Impression / Assessment and Plan / UC Course  I have reviewed the triage vital signs and the nursing notes.  Pertinent labs & imaging results that were available during my care of the patient were reviewed by me and considered in my medical decision making (see chart for details).   MAURIE MUSCO is a 47 y.o. female presenting with acute right-sided lower back pain. Patient is afebrile without recent antipyretics, satting well on room air. Overall is well appearing though appears in a great deal of discomfort, well hydrated, without respiratory distress.  EKG is reassuring with sinus rhythm.  Unclear the source of her chest pain.  Given ketorolac IM in clinic and discharging with a course of prednisone to begin  tomorrow.  Patient will follow-up with orthopedic or physical medicine provider for evaluation of her pain if symptoms continue or recur.  Counseled patient on potential for adverse effects with medications prescribed/recommended today, ER and return-to-clinic precautions discussed, patient verbalized understanding and agreement with care plan.   Final Clinical Impressions(s) / UC Diagnoses   Final diagnoses:  Other chest pain   Discharge Instructions   None    ED Prescriptions   None    PDMP not reviewed this encounter.   Charma Igo, [place] 03/05/23 1524

## 2023-03-06 LAB — URINE CULTURE

## 2023-03-19 DIAGNOSIS — Z419 Encounter for procedure for purposes other than remedying health state, unspecified: Secondary | ICD-10-CM | POA: Diagnosis not present

## 2023-04-19 DIAGNOSIS — Z419 Encounter for procedure for purposes other than remedying health state, unspecified: Secondary | ICD-10-CM | POA: Diagnosis not present

## 2023-04-21 ENCOUNTER — Ambulatory Visit: Payer: 59

## 2023-04-23 ENCOUNTER — Ambulatory Visit (INDEPENDENT_AMBULATORY_CARE_PROVIDER_SITE_OTHER): Payer: 59 | Admitting: Medical

## 2023-04-23 VITALS — BP 120/70 | HR 95 | Temp 97.3°F | Wt 165.6 lb

## 2023-04-23 DIAGNOSIS — R110 Nausea: Secondary | ICD-10-CM | POA: Diagnosis not present

## 2023-04-23 DIAGNOSIS — Z9049 Acquired absence of other specified parts of digestive tract: Secondary | ICD-10-CM

## 2023-04-23 DIAGNOSIS — R61 Generalized hyperhidrosis: Secondary | ICD-10-CM | POA: Diagnosis not present

## 2023-04-23 DIAGNOSIS — R509 Fever, unspecified: Secondary | ICD-10-CM | POA: Diagnosis not present

## 2023-04-23 DIAGNOSIS — Z90710 Acquired absence of both cervix and uterus: Secondary | ICD-10-CM | POA: Diagnosis not present

## 2023-04-23 DIAGNOSIS — R8290 Unspecified abnormal findings in urine: Secondary | ICD-10-CM

## 2023-04-23 DIAGNOSIS — F17200 Nicotine dependence, unspecified, uncomplicated: Secondary | ICD-10-CM | POA: Insufficient documentation

## 2023-04-23 DIAGNOSIS — R634 Abnormal weight loss: Secondary | ICD-10-CM | POA: Diagnosis not present

## 2023-04-23 DIAGNOSIS — R112 Nausea with vomiting, unspecified: Secondary | ICD-10-CM | POA: Insufficient documentation

## 2023-04-23 LAB — POCT URINALYSIS DIP (PROADVANTAGE DEVICE)
Bilirubin, UA: NEGATIVE
Glucose, UA: NEGATIVE mg/dL
Ketones, POC UA: NEGATIVE mg/dL
Leukocytes, UA: NEGATIVE
Nitrite, UA: POSITIVE — AB
Protein Ur, POC: NEGATIVE mg/dL
Specific Gravity, Urine: 1.02
Urobilinogen, Ur: NEGATIVE
pH, UA: 6 (ref 5.0–8.0)

## 2023-04-23 NOTE — Progress Notes (Signed)
Subjective:  Brandi Morris is a 47 y.o. female who presents for Chief Complaint  Patient presents with   Medical Management of Chronic Issues    Tick bite a couple months ago and all spots but one has healed. Weight loss in 3 week was 177 down to 161 according to her scale. Hungry all the time but when she eats then throws it back up will end up in doubled over in pain     Here as a new patient today.  Was going to [facility] prior  She notes losing 17 lb in 3.5 weeks.  Feels nausea, hungry, vomiting within 20-30 minutes of eating when she can eat.   Having stomach pain and diarrhea.   Hurts in lower mid abdomen.   Pulled a tick off 4 weeks ago, had fever for a week.   That resolved.    Been having night sweats for past 1.5 weeks, but off and on for a while as she is perimenopausal.  Has to change bed sheets daily.  No bleeding or bruising.    No prior EGD or colonoscopy.    Last pap 2019.  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  Has increased stress.  Lost mom in December and has possession of brother's kids who are toddlers, 2yo and 5yo.  No other aggravating or relieving factors.    No other c/o.  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 18 g 2   aspirin-acetaminophen-caffeine (EXCEDRIN MIGRAINE) 250-250-65 MG tablet Take 4 tablets by mouth every 6 (six) hours as needed for headache.      diphenhydrAMINE (BENADRYL) 25  MG tablet Take 50 mg by mouth every 6 (six) hours as needed for itching or allergies.      EPINEPHrine (EPIPEN 2-PAK) 0.3 mg/0.3 mL IJ SOAJ injection Inject 0.3 mg into the muscle once as needed (for severe allergic reaction).      No current facility-administered medications on file prior to visit.     The following portions of the patient's history were reviewed and updated as appropriate: allergies, current medications, past family history, past medical history, past social history, past surgical history and problem list.  ROS Otherwise as in subjective above    Objective: BP 120/70   Pulse 95   Temp (!) 97.3 F (36.3 C)   Wt 165 lb 9.6 oz (75.1 kg)   LMP 03/03/2007   BMI 30.29 kg/m   General appearance: alert, no distress, well developed, well nourished, white female HEENT: normocephalic, sclerae anicteric, conjunctiva pink and moist, TMs pearly, nares patent, no discharge or erythema, pharynx normal Oral cavity: somewhat MMM, no lesions Neck: supple, no lymphadenopathy, no thyromegaly, no masses Heart: RRR, normal S1, S2, no murmurs Lungs: CTA bilaterally, no wheezes, rhonchi, or rales Abdomen: +bs, soft, tender epigastric, LUQ, suprapubic and LLQ, otherwise non tender, non distended, no masses,  no hepatomegaly, no splenomegaly Pulses: 2+ radial pulses, 2+ pedal pulses, normal cap refill Ext: no edema   Assessment: Encounter Diagnoses  Name Primary?   Weight loss, abnormal Yes   Fever, unspecified fever cause    Night sweat    Nausea    Smoker    S/P hysterectomy    S/P cholecystectomy    Abnormal urinalysis      Plan: Discussed her symptoms, possible causes of sudden weight loss. She is a long term smoker.  Labs as below, with likely subsequent CT Chest abdomen pelvis given wide range of possible causes including peptic ulcer disease, tumor, thyroid issue or other.    Julienne was seen today for medical management of chronic issues.  Diagnoses and all orders  for this visit:  Weight loss, abnormal -     Comprehensive metabolic panel -     CBC with Differential/Platelet -     TSH -     POCT Urinalysis DIP (Proadvantage Device) -     Lipase  Fever, unspecified fever cause -     Comprehensive metabolic panel -     CBC with Differential/Platelet -     TSH -     POCT Urinalysis DIP (Proadvantage Device) -     Lipase  Night sweat  Nausea -     Comprehensive metabolic panel -     CBC with Differential/Platelet -     TSH -     POCT Urinalysis DIP (Proadvantage Device) -     Lipase  Smoker  S/P hysterectomy  S/P cholecystectomy  Abnormal urinalysis -     Urine Culture    Follow up: pending labs

## 2023-04-24 ENCOUNTER — Other Ambulatory Visit: Payer: Self-pay | Admitting: Medical

## 2023-04-24 DIAGNOSIS — F17200 Nicotine dependence, unspecified, uncomplicated: Secondary | ICD-10-CM

## 2023-04-24 DIAGNOSIS — R3129 Other microscopic hematuria: Secondary | ICD-10-CM

## 2023-04-24 DIAGNOSIS — R509 Fever, unspecified: Secondary | ICD-10-CM

## 2023-04-24 DIAGNOSIS — R634 Abnormal weight loss: Secondary | ICD-10-CM

## 2023-04-24 DIAGNOSIS — Z90710 Acquired absence of both cervix and uterus: Secondary | ICD-10-CM

## 2023-04-24 DIAGNOSIS — Z9049 Acquired absence of other specified parts of digestive tract: Secondary | ICD-10-CM

## 2023-04-24 DIAGNOSIS — R61 Generalized hyperhidrosis: Secondary | ICD-10-CM

## 2023-04-24 LAB — COMPREHENSIVE METABOLIC PANEL
ALT: 11 IU/L (ref 0–32)
AST: 13 IU/L (ref 0–40)
Albumin/Globulin Ratio: 2 (ref 1.2–2.2)
Albumin: 4.5 g/dL (ref 3.9–4.9)
Alkaline Phosphatase: 83 IU/L (ref 44–121)
BUN/Creatinine Ratio: 13 (ref 9–23)
BUN: 11 mg/dL (ref 6–24)
Bilirubin Total: 0.3 mg/dL (ref 0.0–1.2)
CO2: 23 mmol/L (ref 20–29)
Calcium: 9.5 mg/dL (ref 8.7–10.2)
Chloride: 103 mmol/L (ref 96–106)
Creatinine, Ser: 0.85 mg/dL (ref 0.57–1.00)
Globulin, Total: 2.2 g/dL (ref 1.5–4.5)
Glucose: 87 mg/dL (ref 70–99)
Potassium: 4.8 mmol/L (ref 3.5–5.2)
Sodium: 140 mmol/L (ref 134–144)
Total Protein: 6.7 g/dL (ref 6.0–8.5)
eGFR: 85 mL/min/{1.73_m2} (ref >59)

## 2023-04-24 LAB — CBC WITH DIFFERENTIAL/PLATELET
Basophils Absolute: 0.1 10*3/uL (ref 0.0–0.2)
Basos: 1 %
EOS (ABSOLUTE): 0.8 10*3/uL — ABNORMAL HIGH (ref 0.0–0.4)
Eos: 8 %
Hematocrit: 41.6 % (ref 34.0–46.6)
Hemoglobin: 14.3 g/dL (ref 11.1–15.9)
Immature Grans (Abs): 0 10*3/uL (ref 0.0–0.1)
Immature Granulocytes: 0 %
Lymphocytes Absolute: 3.7 10*3/uL — ABNORMAL HIGH (ref 0.7–3.1)
Lymphs: 37 %
MCH: 30.2 pg (ref 26.6–33.0)
MCHC: 34.4 g/dL (ref 31.5–35.7)
MCV: 88 fL (ref 79–97)
Monocytes Absolute: 0.5 10*3/uL (ref 0.1–0.9)
Monocytes: 5 %
Neutrophils Absolute: 4.9 10*3/uL (ref 1.4–7.0)
Neutrophils: 49 %
Platelets: 312 10*3/uL (ref 150–450)
RBC: 4.73 x10E6/uL (ref 3.77–5.28)
RDW: 12.5 % (ref 11.7–15.4)
WBC: 9.9 10*3/uL (ref 3.4–10.8)

## 2023-04-24 LAB — TSH: TSH: 0.836 u[IU]/mL (ref 0.450–4.500)

## 2023-04-24 LAB — LIPASE: Lipase: 25 U/L (ref 14–72)

## 2023-04-24 MED ORDER — DIPHENHYDRAMINE HCL 50 MG PO TABS: ORAL_TABLET | ORAL | 0 refills | Status: DC

## 2023-04-24 MED ORDER — PREDNISONE 50 MG PO TABS: ORAL_TABLET | ORAL | 0 refills | Status: DC

## 2023-04-24 MED ORDER — NITROFURANTOIN MONOHYD MACRO 100 MG PO CAPS: 100.0000 mg | ORAL_CAPSULE | Freq: Two times a day (BID) | ORAL | 0 refills | Status: DC

## 2023-04-24 NOTE — Progress Notes (Signed)
I spoke to radiologist about her contrast allergy previously.  After speaking with the radiologist she advised that she would recommend CT chest abdomen pelvis with contrast but use a steroid protocol as follows.  She needs to be prescribed prednisone 50 mg, 1 tablet 13 hours, 7 hours and 1 hour prior to scan as well as 50 mg of Benadryl 1 hour prior to scan.  I discussed with patient who is agreeable.  Medication sent.

## 2023-04-24 NOTE — Progress Notes (Signed)
Eosinophils a little elevated which can signal allergy or inflammation or other otherwise blood counts normal, glucose, liver, kidney, and electrolytes normal, thyroid ok, pancreas enzyme normal.  Urine showed possible infection.  I am sending urine for culture  Given your weight loss, I recommend CT chest/abdomen/pelvis.  I will place order for this after speaking with radiology  Let me know if you are having urinary frequency, urgency, burning with urine or concern for urinary tract infection.  I did send Macrobid antibiotic to pharmacy for infection.

## 2023-04-26 LAB — URINE CULTURE

## 2023-04-28 NOTE — Progress Notes (Signed)
Results sent through MyChart

## 2023-05-01 ENCOUNTER — Other Ambulatory Visit: Payer: 59

## 2023-05-01 ENCOUNTER — Ambulatory Visit
Admission: RE | Admit: 2023-05-01 | Discharge: 2023-05-01 | Disposition: A | Discharge status: Home / Self Care | Payer: 59 | Source: Ambulatory Visit | Attending: Medical | Admitting: Medical

## 2023-05-01 DIAGNOSIS — R61 Generalized hyperhidrosis: Secondary | ICD-10-CM

## 2023-05-01 DIAGNOSIS — R509 Fever, unspecified: Secondary | ICD-10-CM

## 2023-05-01 DIAGNOSIS — R3129 Other microscopic hematuria: Secondary | ICD-10-CM

## 2023-05-01 DIAGNOSIS — Z90710 Acquired absence of both cervix and uterus: Secondary | ICD-10-CM

## 2023-05-01 DIAGNOSIS — I700 Atherosclerosis of aorta: Secondary | ICD-10-CM | POA: Diagnosis not present

## 2023-05-01 DIAGNOSIS — F17200 Nicotine dependence, unspecified, uncomplicated: Secondary | ICD-10-CM

## 2023-05-01 DIAGNOSIS — D3501 Benign neoplasm of right adrenal gland: Secondary | ICD-10-CM | POA: Diagnosis not present

## 2023-05-01 DIAGNOSIS — K5730 Diverticulosis of large intestine without perforation or abscess without bleeding: Secondary | ICD-10-CM | POA: Diagnosis not present

## 2023-05-01 DIAGNOSIS — R634 Abnormal weight loss: Secondary | ICD-10-CM | POA: Diagnosis not present

## 2023-05-01 DIAGNOSIS — N202 Calculus of kidney with calculus of ureter: Secondary | ICD-10-CM | POA: Diagnosis not present

## 2023-05-01 DIAGNOSIS — J439 Emphysema, unspecified: Secondary | ICD-10-CM | POA: Diagnosis not present

## 2023-05-01 DIAGNOSIS — Z9049 Acquired absence of other specified parts of digestive tract: Secondary | ICD-10-CM

## 2023-05-01 MED ORDER — IOPAMIDOL (ISOVUE-300) INJECTION 61%
100.0000 mL | Freq: Once | INTRAVENOUS | Status: AC | PRN
  Administered 2023-05-01: 100 mL via INTRAVENOUS

## 2023-05-06 ENCOUNTER — Telehealth: Payer: Self-pay | Admitting: Medical

## 2023-05-06 NOTE — Telephone Encounter (Signed)
Please call CT results

## 2023-05-11 NOTE — Progress Notes (Signed)
Lets get her back into discuss the results of the CT scan.  The most important finding is NO worrisome tumor in the chest, abdomen or pelvis.  There are other findings that we need to discuss and to recheck on her weight.

## 2023-05-14 ENCOUNTER — Telehealth: Payer: Self-pay

## 2023-05-14 NOTE — Patient Outreach (Signed)
  Care Coordination   Initial Visit Note   05/14/2023 Name: Brandi Morris MRN: [id number] DOB: June 05, 1976  Brandi Morris is a 47 y.o. year old female who sees Tysinger, Kermit Balo, PA-C for primary care. I spoke with  Brandi Morris by phone today.  What matters to the patients health and wellness today?  Placed call to patient to review and offer [facility] care coordination program.  Patient reports that she has been having abdominal pain for 4 months. Reports 22 pound weight loss in the last 5-6 weeks.  Has follow up with PCP next week to discuss CT results.  Patient has verbally consented to [facility] care coordination program.  Appointment scheduled with Delsa Sale RN.       SDOH assessments and interventions completed:  No     Care Coordination Interventions:  Yes, provided   Interventions Today    Flowsheet Row Most Recent Value  Education Interventions   Education Provided Provided Education  [facility] program and schedule appointment with RN CM for PCP office Lawanna Kobus [place].  Patient informed of appointment date and time.]        Follow up plan: Follow up call scheduled for 05/27/2023    Encounter Outcome:  Pt. Visit Completed   Rowe Pavy, RN, BSN, CEN [facility] Coordinator [phone]

## 2023-05-19 DIAGNOSIS — Z419 Encounter for procedure for purposes other than remedying health state, unspecified: Secondary | ICD-10-CM | POA: Diagnosis not present

## 2023-05-20 ENCOUNTER — Encounter: Payer: Self-pay | Admitting: Medical

## 2023-05-20 ENCOUNTER — Ambulatory Visit (INDEPENDENT_AMBULATORY_CARE_PROVIDER_SITE_OTHER): Payer: 59 | Admitting: Medical

## 2023-05-20 VITALS — BP 120/70 | HR 76 | Ht 62.0 in | Wt 165.2 lb

## 2023-05-20 DIAGNOSIS — F32A Depression, unspecified: Secondary | ICD-10-CM | POA: Diagnosis not present

## 2023-05-20 DIAGNOSIS — Z1211 Encounter for screening for malignant neoplasm of colon: Secondary | ICD-10-CM

## 2023-05-20 DIAGNOSIS — F419 Anxiety disorder, unspecified: Secondary | ICD-10-CM | POA: Diagnosis not present

## 2023-05-20 DIAGNOSIS — R110 Nausea: Secondary | ICD-10-CM | POA: Diagnosis not present

## 2023-05-20 DIAGNOSIS — Z90710 Acquired absence of both cervix and uterus: Secondary | ICD-10-CM

## 2023-05-20 DIAGNOSIS — Z9049 Acquired absence of other specified parts of digestive tract: Secondary | ICD-10-CM | POA: Diagnosis not present

## 2023-05-20 DIAGNOSIS — R109 Unspecified abdominal pain: Secondary | ICD-10-CM

## 2023-05-20 MED ORDER — DICYCLOMINE HCL 20 MG PO TABS: 20.0000 mg | ORAL_TABLET | Freq: Two times a day (BID) | ORAL | 0 refills | Status: DC | PRN

## 2023-05-20 MED ORDER — BUSPIRONE HCL 7.5 MG PO TABS: 7.5000 mg | ORAL_TABLET | Freq: Two times a day (BID) | ORAL | 0 refills | Status: DC

## 2023-05-20 MED ORDER — ONDANSETRON HCL 4 MG PO TABS: 4.0000 mg | ORAL_TABLET | Freq: Three times a day (TID) | ORAL | 1 refills | Status: DC | PRN

## 2023-05-20 MED ORDER — BISMUTH SUBSALICYLATE 262 MG PO CHEW: 524.0000 mg | CHEWABLE_TABLET | Freq: Two times a day (BID) | ORAL | 0 refills | Status: AC

## 2023-05-20 MED ORDER — OMEPRAZOLE 40 MG PO CPDR: 40.0000 mg | DELAYED_RELEASE_CAPSULE | Freq: Every day | ORAL | 1 refills | Status: DC

## 2023-05-20 NOTE — Patient Instructions (Addendum)
Recommendations: Begin omeprazole 40 mg, 1 capsule preferably 45 minutes before breakfast 2 to 4 weeks Begin bismuth/Pepto-Bismol tablets twice daily for at least 2 weeks but preferably up to 3 to 4 weeks You can use dicyclomine Bentyl 20 mg twice daily for spasms of the colon or diarrhea and see how this does You can use Zofran Ondansetron for nausea as needed Avoid foods that make things worse Keep a food diary in the meantime to try to isolate things that do not give you problems versus things that usually stay away from We should consider referral to gastroenterology for further evaluation in general since you are due for colon cancer screening at the end of age 20, but also consider upper endoscopy given the symptoms Thankfully the CT scan did not show any cancer or tumor

## 2023-05-20 NOTE — Progress Notes (Signed)
Subjective:  Brandi Morris is a 47 y.o. female who presents for Chief Complaint  Patient presents with   Follow-up    Here to discuss CT results. Kidneys bled after CT would like to let you know. Spasms.      Here for follow-up.  I saw her recently for a new patient visit she had several symptoms of concern at that time including recent significant weight loss unexplained, nausea, abdominal pain, loose stools and other symptoms.  She also has been doing a lot of stress and anxiety.  After that visit we set her up for CT scan and labs.  Here to discuss the CT scan and labs.  Since last visit the nausea continues but the vomiting has lessened.  She still gets some significant severe stomach pains.  Eating really hurts, several things make her symptoms worse.  She can eat greasy or fried foods without getting symptoms.  She gets a lot of gas and bloating.  She gets diarrhea within 15 to 20 minutes of eating.  Her diarrhea seems acidic based on the smell and based on the fact that it burns at the rectum when she has a bowel movement.  She seems to tolerate plain pasta with chicken broth, dry toast.    Using imodium 2-3 twice daily with no relief.  Used some Pepto Bismol.  She has been getting some right low back pain since her scan.  She had bleeding in the urine or from the kidney as she describes after the CT scan.  She has had prior problems with CT scan contrast.  She vows never to do another CT scan with contrast.  She has a history of over 50 prior kidney stones  She would like to quit smoking but is hard to quit with all of her stress and anxiety.  She has no other vices, does not drink alcohol, does not do drugs, is hard for her to not smoke given the stress.  She has seen counselor in the past.   Stressors include dealing with some legal issues in [place] where she has to go to that state to deal with the issues, she is taking care of her brother's children who are toddlers.  Her mother  passed away in Nov 01, 2023.  Been having night sweats for past 1.5 weeks, but off and on for a while as she is perimenopausal.  Has to change bed sheets daily. Last pap 2019.  Ended up having hysterectomy but still has ovaries.  Has a history of endometriosis.  In the past did not tolerate Wellbutrin or Chantix.  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  No other aggravating or relieving factors.    No other c/o.  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      albuterol (PROVENTIL HFA;VENTOLIN HFA) 108 (90 BASE) MCG/ACT inhaler Inhale 1-2 puffs into the lungs every 6 (six) hours as needed for  wheezing or shortness of breath. (Patient not taking: Reported on 05/20/2023) 18 g 2   aspirin-acetaminophen-caffeine (EXCEDRIN MIGRAINE) 250-250-65 MG tablet Take 4 tablets by mouth every 6 (six) hours as needed for headache.  (Patient not taking: Reported on 05/20/2023)     EPINEPHrine (EPIPEN 2-PAK) 0.3 mg/0.3 mL IJ SOAJ injection Inject 0.3 mg into the muscle once as needed (for severe allergic reaction).  (Patient not taking: Reported on 05/20/2023)     No current facility-administered medications on file prior to visit.     The following portions of the patient's history were reviewed and updated as appropriate: allergies, current medications, past family history, past medical history, past social history, past surgical history and problem list.  ROS Otherwise as in subjective above    Objective: BP 120/70   Pulse 76    Ht 5\' 2"  (1.575 m)   Wt 165 lb 3.2 oz (74.9 kg)   LMP 03/03/2007   SpO2 98%   BMI 30.22 kg/m   Wt Readings from Last 3 Encounters:  05/20/23 165 lb 3.2 oz (74.9 kg)  04/23/23 165 lb 9.6 oz (75.1 kg)  01/12/23 168 lb (76.2 kg)     General appearance: alert, no distress, well developed, well nourished, white female HEENT: normocephalic, sclerae anicteric, conjunctiva pink and moist, TMs pearly, nares patent, no discharge or erythema, pharynx normal Oral cavity: somewhat MMM, no lesions Neck: supple, no lymphadenopathy, no thyromegaly, no masses Heart: RRR, normal S1, S2, no murmurs Lungs: CTA bilaterally, no wheezes, rhonchi, or rales Abdomen: +bs, soft, tender epigastric, LUQ, suprapubic and LLQ, otherwise non tender, non distended, no masses, no hepatomegaly, no splenomegaly Pulses: 2+ radial pulses, 2+ pedal pulses, normal cap refill Ext: no edema   Assessment: Encounter Diagnoses  Name Primary?   Abdominal pain, unspecified abdominal location Yes   S/P cholecystectomy    S/P hysterectomy    Anxiety and depression    Nausea    Abdominal cramping    Screen for colon cancer       Plan: We discussed her recent CT scan chest abdomen pelvis.  Thankfully no tumor or cancer obvious.  She did have aortic atherosclerosis and emphysema findings.  We discussed these.  Her weight has remained stable since last visit thankfully.  I suspect she could have a gastric ulcer.  She definitely has some issues with anxiety and stress at the moment.  Counseled on ways to deal with both.  Begin trial of BuSpar which she has taken in the past with good improvements.  Consider counseling.  Recommendations: Begin omeprazole 40 mg, 1 capsule preferably 45 minutes before breakfast 2 to 4 weeks Begin bismuth/Pepto-Bismol tablets twice daily for at least 2 weeks but preferably up to 3 to 4 weeks You can use dicyclomine Bentyl 20 mg twice daily for spasms of the colon or diarrhea and see how  this does You can use Zofran Ondansetron for nausea as needed Avoid foods that make things worse Keep a food diary in the meantime to try to isolate things that do not give you problems versus things that usually stay away from We should consider referral to gastroenterology for further evaluation in general since you are due for colon cancer screening at the end of age 2, but also consider upper endoscopy given the symptoms Thankfully the CT scan did not show any cancer or tumor  Liberti was seen today for follow-up.  Diagnoses and all orders for this visit:  Abdominal pain, unspecified abdominal location -  Ambulatory referral to Gastroenterology  S/P cholecystectomy  S/P hysterectomy  Anxiety and depression  Nausea -     Ambulatory referral to Gastroenterology  Abdominal cramping -     Ambulatory referral to Gastroenterology  Screen for colon cancer -     Ambulatory referral to Gastroenterology  Other orders -     omeprazole (PRILOSEC) 40 MG capsule; Take 1 capsule (40 mg total) by mouth daily. -     bismuth subsalicylate (PEPTO-BISMOL) 262 MG chewable tablet; Chew 2 tablets (524 mg total) by mouth in the morning and at bedtime. -     dicyclomine (BENTYL) 20 MG tablet; Take 1 tablet (20 mg total) by mouth 2 (two) times daily as needed for spasms. -     ondansetron (ZOFRAN) 4 MG tablet; Take 1 tablet (4 mg total) by mouth every 8 (eight) hours as needed for nausea or vomiting. -     busPIRone (BUSPAR) 7.5 MG tablet; Take 1 tablet (7.5 mg total) by mouth 2 (two) times daily.   Follow up: 20mo

## 2023-05-27 ENCOUNTER — Ambulatory Visit: Payer: Self-pay

## 2023-05-27 NOTE — Patient Outreach (Signed)
  Care Coordination   05/27/2023 Name: Brandi Morris MRN: [id number] DOB: 25-Jun-1976   Care Coordination Outreach Attempts:  An unsuccessful telephone outreach was attempted for a scheduled appointment today.  Follow Up Plan:  Additional outreach attempts will be made to offer the patient care coordination information and services.   Encounter Outcome:  No Answer   Care Coordination Interventions:  No, not indicated    Delsa Sale, RN, BSN, CCM Care Management Coordinator [facility] Care Management Direct Phone: [phone]

## 2023-06-13 ENCOUNTER — Ambulatory Visit: Payer: Self-pay

## 2023-06-13 NOTE — Patient Instructions (Signed)
Visit Information  Thank you for taking time to visit with me today. Please don't hesitate to contact me if I can be of assistance to you.   Following are the goals we discussed today:   Goals Addressed             This Visit's Progress    To have weight loss and GI symptoms evaluated and treated       Care Coordination Interventions: Evaluation of current treatment plan related to abnormal weight loss; nausea; impaired bowel elimination with abdominal pain   and patient's adherence to plan as established by provider Reviewed and discussed with patient she has experienced approximately 22 lb weight loss due the inability to tolerate most food groups, she is keeping a food journal, experiencing abdominal pain, nausea and watery stools Reviewed and discussed PCP recommendations for symptom management Reviewed medications with patient and discussed importance of medication adherence Reviewed scheduled/upcoming provider appointment including: new patient appointment with [facility] in [place] scheduled for 07/10/23 @3 :00 PM with Celso Amy PA-C Active listening / Reflection utilized   Discussed plans with patient for ongoing care coordination follow up and provided patient with direct contact information for nurse care coordinator        Our next appointment is by telephone on 07/17/23 at 10:30 AM  Please call the care guide team at [phone] if you need to cancel or reschedule your appointment.   If you are experiencing a Mental Health or Behavioral Health Crisis or need someone to talk to, please call 1-800-273-TALK (toll free, 24 hour hotline)  Patient verbalizes understanding of instructions and care plan provided today and agrees to view in MyChart. Active MyChart status and patient understanding of how to access instructions and care plan via MyChart confirmed with patient.     Delsa Sale, RN, BSN, CCM Care Management Coordinator [facility] Care Management Direct Phone:  [phone]

## 2023-06-13 NOTE — Patient Outreach (Signed)
  Care Coordination   Initial Visit Note   06/13/2023 Name: TORINA THRUSH MRN: [id number] DOB: July 01, 1976  Nance Pear Dumas is a 47 y.o. year old female who sees Tysinger, Kermit Balo, PA-C for primary care. I spoke with  Nance Pear Lourenco by phone today.  What matters to the patients health and wellness today?  Patient would like to have her weight loss and GI symptoms further evaluated and treated.     Goals Addressed             This Visit's Progress    To have weight loss and GI symptoms evaluated and treated       Care Coordination Interventions: Evaluation of current treatment plan related to abnormal weight loss; nausea; impaired bowel elimination with abdominal pain   and patient's adherence to plan as established by provider Reviewed and discussed with patient she has experienced approximately 22 lb weight loss due the inability to tolerate most food groups, she is keeping a food journal, experiencing abdominal pain, nausea and watery stools Reviewed and discussed PCP recommendations for symptom management Reviewed medications with patient and discussed importance of medication adherence Reviewed scheduled/upcoming provider appointment including: new patient appointment with [facility] in [place] scheduled for 07/10/23 @3 :00 PM with Celso Amy PA-C Active listening / Reflection utilized   Discussed plans with patient for ongoing care coordination follow up and provided patient with direct contact information for nurse care coordinator    Interventions Today    Flowsheet Row Most Recent Value  Chronic Disease   Chronic disease during today's visit Other  [impaired bowel elimination,  nause,  vomiting,  weight loss]  General Interventions   General Interventions Discussed/Reviewed General Interventions Discussed, General Interventions Reviewed, Doctor Visits  Doctor Visits Discussed/Reviewed Doctor Visits Discussed, Doctor Visits Reviewed, PCP  Education Interventions    Education Provided Provided Education  Provided Verbal Education On Nutrition, When to see the doctor, Medication  Nutrition Interventions   Nutrition Discussed/Reviewed Nutrition Discussed, Nutrition Reviewed  Pharmacy Interventions   Pharmacy Dicussed/Reviewed Pharmacy Topics Reviewed, Pharmacy Topics Discussed, Medications and their functions          SDOH assessments and interventions completed:  Yes  SDOH Interventions Today    Flowsheet Row Most Recent Value  SDOH Interventions   Transportation Interventions Intervention Not Indicated        Care Coordination Interventions:  No, not indicated   Follow up plan: Follow up call scheduled for 07/17/23 @10 :30 AM    Encounter Outcome:  Pt. Visit Completed

## 2023-06-19 DIAGNOSIS — Z419 Encounter for procedure for purposes other than remedying health state, unspecified: Secondary | ICD-10-CM | POA: Diagnosis not present

## 2023-07-08 ENCOUNTER — Other Ambulatory Visit: Payer: Self-pay

## 2023-07-08 DIAGNOSIS — F411 Generalized anxiety disorder: Secondary | ICD-10-CM | POA: Insufficient documentation

## 2023-07-08 DIAGNOSIS — R197 Diarrhea, unspecified: Secondary | ICD-10-CM | POA: Insufficient documentation

## 2023-07-08 DIAGNOSIS — Z1211 Encounter for screening for malignant neoplasm of colon: Secondary | ICD-10-CM | POA: Insufficient documentation

## 2023-07-09 NOTE — Progress Notes (Signed)
Celso Amy, PA-C [address]  [address]  [place], [place] [address]  Main: [phone]  Fax: [phone]   Gastroenterology Consultation  Referring Provider:     Jac Canavan, PA-C Primary Care Physician:  Jac Canavan, PA-C Primary Gastroenterologist:  Celso Amy, PA-C / Dr. Wyline Mood   Reason for Consultation:     Diarrhea, Nausea, Abdominal Pain        HPI:   Brandi Morris is a 47 y.o. y/o female referred for consultation & management  by Jac Canavan, PA-C.   Here to evaluate episodes of abdominal pain, nausea, vomiting, and diarrhea.  Also reports mild weight loss.  GI symptoms are triggered by eating fatty and spicy foods.  When she eats fried foods then she has increased episodes of nausea, vomiting, diarrhea, and lower abdominal cramping.  Patient reports having moderate watery stools since March 2024.  She has not had a formed stool in the past 6 months.  She denies rectal bleeding.  She has not had any stool studies.  She took antibiotics 04/2023 for UTI.  She is age 47 and is due for her first screening colonoscopy.  She has never had a colonoscopy or EGD.  She has family history of maternal aunt who had stomach and colon cancer.  No first-degree relatives.  Labs 04/23/2023 showed normal CBC, CMP, TSH, and lipase.    CT chest abdomen and pelvis with contrast 04/2023 showed mild emphysema, right adrenal adenoma benign, left nephrolithiasis, sigmoid diverticulosis but no diverticulitis, previous cholecystectomy and hysterectomy.   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 Authorizing Provider  albuterol (PROVENTIL HFA;VENTOLIN HFA) 108 (90 BASE) MCG/ACT inhaler Inhale 1-2 puffs into the lungs every 6 (six) hours as needed for wheezing or shortness of breath. Patient not taking: Reported on 05/20/2023 07/02/15   Veryl Speak, FNP  aspirin-acetaminophen-caffeine (EXCEDRIN MIGRAINE) [phone] MG tablet Take 4 tablets by mouth every 6 (six) hours as needed for headache.  Patient not taking: Reported on 05/20/2023    [provider]  bismuth subsalicylate (PEPTO-BISMOL) 262 MG chewable tablet Chew 2 tablets (524 mg total) by mouth in the morning and at bedtime. 05/20/23   Tysinger, Kermit Balo, PA-C  busPIRone (BUSPAR) 7.5 MG tablet Take 1 tablet (7.5 mg total) by mouth 2 (two) times daily. 05/20/23   Tysinger, Kermit Balo, PA-C  dicyclomine (BENTYL) 20 MG tablet Take 1 tablet (20 mg total) by mouth 2 (two) times daily as needed for spasms.  05/20/23   Tysinger, Kermit Balo, PA-C  diphenhydrAMINE (BENADRYL) 25 MG tablet Take 50 mg by mouth every 6 (six) hours as needed for itching or allergies.     [provider]  EPINEPHrine (EPIPEN 2-PAK) 0.3 mg/0.3 mL IJ SOAJ injection Inject 0.3 mg into the muscle once as needed (for severe allergic reaction).  Patient not taking: Reported on 05/20/2023    [provider]  meloxicam (MOBIC) 7.5 MG tablet Take 1 tablet by mouth 2 (two) times daily with a meal.    [provider]  omeprazole (PRILOSEC) 40 MG capsule Take 1 capsule (40 mg total) by mouth daily. 05/20/23   Tysinger, Kermit Balo, PA-C  ondansetron (ZOFRAN) 4 MG tablet Take 1 tablet (4 mg  total) by mouth every 8 (eight) hours as needed for nausea or vomiting. 05/20/23   Tysinger, Kermit Balo, PA-C  Wheat Dextrin (BENEFIBER DRINK MIX PO) as directed Orally    [provider]    Family History  Problem Relation Age of Onset   Heart disease Mother    COPD Mother    Diabetes Father    Hypertension Father    Stroke Father    Heart disease Father    Breast cancer Maternal Grandmother    Cancer Maternal Grandfather        melanoma   Heart disease Paternal Grandmother    Stroke Paternal Grandmother    Heart attack Paternal Grandmother    Heart disease Paternal Grandfather    Stroke Paternal Grandfather    Heart attack Paternal Grandfather      Social History   Tobacco Use   Smoking status: Every Day    Current packs/day: 1.00    Average packs/day: 1 pack/day for 25.0 years (25.0 ttl pk-yrs)    Types: Cigarettes   Smokeless tobacco: Never  Vaping Use   Vaping status: Never Used  Substance Use Topics   Alcohol use: Not Currently   Drug use: No    Allergies as of 07/10/2023 - Review Complete 07/10/2023  Allergen Reaction Noted   Bee venom Anaphylaxis 04/10/2014   Flexeril [cyclobenzaprine] Other (See Comments) 01/17/2014   Lavender oil Anaphylaxis and Other (See Comments) 01/17/2014   Robaxin [methocarbamol] Other (See Comments) 01/17/2014   Shellfish allergy Hives 04/10/2014   Amitriptyline Other (See Comments) 12/22/2011   Entex Other (See Comments) 07/30/2007   Singulair [montelukast sodium] Other (See Comments) 05/13/2015   Etodolac Other (See Comments) 07/30/2007   Iodine Hives 04/10/2014   Mushroom extract complex Hives 04/10/2014    Review of Systems:    All systems reviewed and negative except where noted in HPI.   Physical Exam:  BP 108/72   Pulse 94   Temp 98.4 F (36.9 C)   Ht 5\' 2"  (1.575 m)   Wt 162 lb 12.8 oz (73.8 kg)   LMP 03/03/2007   BMI 29.78 kg/m  Patient's last menstrual period was 03/03/2007.  General:   Alert,   Well-developed, well-nourished, pleasant and cooperative in NAD Lungs:  Respirations even and unlabored.  Clear throughout to auscultation.   No wheezes, crackles, or rhonchi. No acute distress. Heart:  Regular rate and rhythm; no murmurs, clicks, rubs, or gallops. Abdomen:  Normal bowel sounds.  No bruits.  Soft, and non-distended without masses, hepatosplenomegaly or hernias noted.  No Tenderness.  No guarding or rebound tenderness.    Neurologic:  Alert and oriented x3;  grossly normal neurologically. Psych:  Alert and cooperative. Normal mood and affect.  Imaging Studies:  No results found.  Assessment and Plan:   Brandi Morris is a 47 y.o. y/o female has been referred for:  Diarrhea Stool Studies: GI Pathogen Panal, C. Difficile Toxin PCR, Fecal Calprotectin   Nausea and vomiting -episodes; previous cholecystectomy.  Scheduling EGD I discussed risks of EGD with patient to include risk of bleeding, perforation, and risk of sedation.  Patient expressed understanding and agrees to proceed with EGD.   Lower abdominal cramping -abdominal pelvic CT 04/2023 showed no acute abnormality.  Continue dicyclomine as needed.  Colon cancer screening Scheduling Colonoscopy I discussed risks of colonoscopy with patient to include risk of bleeding, colon perforation, and risk of sedation.  Patient expressed understanding and agrees to proceed with colonoscopy.   Follow up 4 weeks after EGD and colonoscopy with TG.  Celso Amy, PA-C

## 2023-07-10 ENCOUNTER — Encounter: Payer: Self-pay | Admitting: Physician Assistant

## 2023-07-10 ENCOUNTER — Ambulatory Visit (INDEPENDENT_AMBULATORY_CARE_PROVIDER_SITE_OTHER): Payer: 59 | Admitting: Physician Assistant

## 2023-07-10 VITALS — BP 108/72 | HR 94 | Temp 98.4°F | Ht 62.0 in | Wt 162.8 lb

## 2023-07-10 DIAGNOSIS — R197 Diarrhea, unspecified: Secondary | ICD-10-CM

## 2023-07-10 DIAGNOSIS — R109 Unspecified abdominal pain: Secondary | ICD-10-CM

## 2023-07-10 DIAGNOSIS — R1030 Lower abdominal pain, unspecified: Secondary | ICD-10-CM

## 2023-07-10 DIAGNOSIS — Z1211 Encounter for screening for malignant neoplasm of colon: Secondary | ICD-10-CM

## 2023-07-10 DIAGNOSIS — R112 Nausea with vomiting, unspecified: Secondary | ICD-10-CM

## 2023-07-17 ENCOUNTER — Ambulatory Visit: Payer: Self-pay

## 2023-07-17 NOTE — Patient Outreach (Signed)
  Care Coordination   Follow Up Visit Note   07/17/2023 Name: SHALIAH BUSLER MRN: [id number] DOB: 1976/01/29  Nance Pear Beidler is a 47 y.o. year old female who sees Tysinger, Kermit Balo, PA-C for primary care. I spoke with  Nance Pear Heiss by phone today.  What matters to the patients health and wellness today?  Patient would like to undergo her scheduled Colonoscopy and EGD for evaluation of her impaired bowel elimination.     Goals Addressed             This Visit's Progress    To have weight loss and GI symptoms evaluated and treated   On track    Care Coordination Interventions: Evaluation of current treatment plan related to abnormal weight loss; nausea; impaired bowel elimination with abdominal pain   and patient's adherence to plan as established by provider Determined patient completed her initial follow up with Celso Amy PA-C with [facility] at [facility] Review of patient status, including review of consultant's reports, relevant laboratory and other test results, and medications completed Reviewed and discussed with patient her upcoming scheduled Colonoscopy and EGD for further evaluation of her symptoms, patient verbalizes understanding regarding the procedure prep and what to expect  Discussed plans with patient for ongoing care coordination follow up and provided patient with direct contact information for nurse care coordinator    Interventions Today    Flowsheet Row Most Recent Value  Chronic Disease   Chronic disease during today's visit Other  [impaired bowel elimination]  General Interventions   General Interventions Discussed/Reviewed General Interventions Discussed, General Interventions Reviewed, Labs, Doctor Visits  Doctor Visits Discussed/Reviewed Doctor Visits Discussed, Doctor Visits Reviewed, Specialist  Education Interventions   Education Provided Provided Education  Provided Verbal Education On When to see the doctor, Labs,  Other  [Stool Studies: GI Pathogen Panal, C. Difficile Toxin PCR, Fecal Calprotectin,  prep for Colonoscopy/EGD]          SDOH assessments and interventions completed:  No     Care Coordination Interventions:  Yes, provided   Follow up plan: Follow up call scheduled for 09/03/23 @2 :30 PM     Encounter Outcome:  Pt. Visit Completed

## 2023-07-17 NOTE — Patient Outreach (Signed)
  Care Coordination   07/17/2023 Name: Brandi Morris MRN: [id number] DOB: 02/22/1976   Care Coordination Outreach Attempts:  An unsuccessful telephone outreach was attempted for a scheduled appointment today.  Follow Up Plan:  Additional outreach attempts will be made to offer the patient care coordination information and services.   Encounter Outcome:  Pt. Request to Call Back   Care Coordination Interventions:  No, not indicated    Delsa Sale, RN, BSN, CCM Care Management Coordinator [facility] Care Management  Direct Phone: [phone]

## 2023-07-17 NOTE — Patient Instructions (Signed)
Visit Information  Thank you for taking time to visit with me today. Please don't hesitate to contact me if I can be of assistance to you.   Following are the goals we discussed today:   Goals Addressed             This Visit's Progress    To have weight loss and GI symptoms evaluated and treated   On track    Care Coordination Interventions: Evaluation of current treatment plan related to abnormal weight loss; nausea; impaired bowel elimination with abdominal pain   and patient's adherence to plan as established by provider Determined patient completed her initial follow up with Celso Amy PA-C with [facility] at [facility] Review of patient status, including review of consultant's reports, relevant laboratory and other test results, and medications completed Reviewed and discussed with patient her upcoming scheduled Colonoscopy and EGD for further evaluation of her symptoms, patient verbalizes understanding regarding the procedure prep and what to expect  Discussed plans with patient for ongoing care coordination follow up and provided patient with direct contact information for nurse care coordinator        Our next appointment is by telephone on 09/03/23 at 2:30 PM  Please call the care guide team at [phone] if you need to cancel or reschedule your appointment.   If you are experiencing a Mental Health or Behavioral Health Crisis or need someone to talk to, please call 1-800-273-TALK (toll free, 24 hour hotline)  Patient verbalizes understanding of instructions and care plan provided today and agrees to view in MyChart. Active MyChart status and patient understanding of how to access instructions and care plan via MyChart confirmed with patient.     Delsa Sale, RN, BSN, CCM Care Management Coordinator [facility] Care Management Direct Phone: [phone]

## 2023-07-20 DIAGNOSIS — Z419 Encounter for procedure for purposes other than remedying health state, unspecified: Secondary | ICD-10-CM | POA: Diagnosis not present

## 2023-07-21 DIAGNOSIS — R197 Diarrhea, unspecified: Secondary | ICD-10-CM | POA: Diagnosis not present

## 2023-07-26 LAB — C DIFFICILE, CYTOTOXIN B

## 2023-07-26 LAB — GI PROFILE, STOOL, PCR

## 2023-07-26 LAB — CALPROTECTIN, FECAL: Calprotectin, Fecal: 13 ug/g (ref 0–120)

## 2023-07-26 LAB — C DIFFICILE TOXINS A+B W/RFLX: C difficile Toxins A+B, EIA: NEGATIVE

## 2023-08-14 ENCOUNTER — Telehealth: Payer: 59 | Admitting: Physician Assistant

## 2023-08-14 DIAGNOSIS — B9689 Other specified bacterial agents as the cause of diseases classified elsewhere: Secondary | ICD-10-CM | POA: Diagnosis not present

## 2023-08-14 DIAGNOSIS — J208 Acute bronchitis due to other specified organisms: Secondary | ICD-10-CM | POA: Diagnosis not present

## 2023-08-14 MED ORDER — AZITHROMYCIN 250 MG PO TABS
ORAL_TABLET | ORAL | 0 refills | Status: AC
Start: 2023-08-14 — End: 2023-08-19

## 2023-08-14 MED ORDER — ALBUTEROL SULFATE HFA 108 (90 BASE) MCG/ACT IN AERS
1.0000 | INHALATION_SPRAY | Freq: Four times a day (QID) | RESPIRATORY_TRACT | 0 refills | Status: AC | PRN
Start: 2023-08-14 — End: ?

## 2023-08-14 MED ORDER — PREDNISONE 20 MG PO TABS
40.0000 mg | ORAL_TABLET | Freq: Every day | ORAL | 0 refills | Status: DC
Start: 2023-08-14 — End: 2023-11-24

## 2023-08-14 MED ORDER — PROMETHAZINE-DM 6.25-15 MG/5ML PO SYRP
5.0000 mL | ORAL_SOLUTION | Freq: Four times a day (QID) | ORAL | 0 refills | Status: DC | PRN
Start: 2023-08-14 — End: 2023-11-24

## 2023-08-14 NOTE — Progress Notes (Signed)
Virtual Visit Consent   Brandi Morris, you are scheduled for a virtual visit with a [facility] provider today. Just as with appointments in the office, your consent must be obtained to participate. Your consent will be active for this visit and any virtual visit you may have with one of our providers in the next 365 days. If you have a MyChart account, a copy of this consent can be sent to you electronically.  As this is a virtual visit, video technology does not allow for your provider to perform a traditional examination. This may limit your provider's ability to fully assess your condition. If your provider identifies any concerns that need to be evaluated in person or the need to arrange testing (such as labs, EKG, etc.), we will make arrangements to do so. Although advances in technology are sophisticated, we cannot ensure that it will always work on either your end or our end. If the connection with a video visit is poor, the visit may have to be switched to a telephone visit. With either a video or telephone visit, we are not always able to ensure that we have a secure connection.  By engaging in this virtual visit, you consent to the provision of healthcare and authorize for your insurance to be billed (if applicable) for the services provided during this visit. Depending on your insurance coverage, you may receive a charge related to this service.  I need to obtain your verbal consent now. Are you willing to proceed with your visit today? Brandi Morris has provided verbal consent on 08/14/2023 for a virtual visit (video or telephone). [name], PA-C  Date: 08/14/2023 10:56 AM  Virtual Visit via Video Note   I, [name], connected with  Brandi Morris  ([id number], 1975-12-06) on 08/14/23 at 10:45 AM EDT by a video-enabled telemedicine application and verified that I am speaking with the correct person using two identifiers.  Location: Patient: Virtual Visit  Location Patient: Home Provider: Virtual Visit Location Provider: Home Office   I discussed the limitations of evaluation and management by telemedicine and the availability of in person appointments. The patient expressed understanding and agreed to proceed.    History of Present Illness: Brandi Morris is a 47 y.o. who identifies as a female who was assigned female at birth, and is being seen today for Covid 60.  HPI: URI  This is a new problem. Episode onset: tested positive for Covid 19 last Thursday, still testing positive today; Symptoms started last Tuesday; progressed yesterday with significant coughing. The problem has been gradually worsening. Maximum temperature: subjective. Associated symptoms include chest pain (tightness), congestion, coughing, diarrhea (did have norovirus in early sept and still having weight loss; has coloonoscopy in Oct), ear pain (left; tickling sensation), headaches, rhinorrhea (and post nasal drainage), sinus pain and wheezing. Pertinent negatives include no nausea, plugged ear sensation, sore throat or vomiting. She has tried inhaler use (tylenol, ibuprofen, dayquil, nyquil) for the symptoms. The treatment provided moderate relief.     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     Robaxin [Methocarbamol] Other (See Comments)    Reaction:  Makes pt violent    Shellfish Allergy Hives   Amitriptyline Other (See Comments)    Reaction:  Hallucinations    Entex Other (See Comments)    Reaction:  Dizziness    Singulair [Montelukast Sodium] Other (See Comments)    Reaction:  Agitation  Pt states that her sinuses fill-up with fluid.     Etodolac Other (See Comments)    Reaction:  Dizziness    Iodine Hives    CT Contrast   Mushroom Extract Complex Hives   Medications:  Current Outpatient Medications:    albuterol (VENTOLIN HFA) 108 (90 Base) MCG/ACT inhaler, Inhale 1-2 puffs into the lungs every 6 (six) hours as needed., Disp: 18 g, Rfl: 0   azithromycin (ZITHROMAX) 250 MG tablet, Take 2 tablets on day 1, then 1 tablet daily on days 2 through 5, Disp: 6 tablet, Rfl: 0   predniSONE (DELTASONE) 20 MG tablet, Take 2 tablets (40 mg total) by mouth daily with breakfast., Disp: 14 tablet, Rfl: 0   promethazine-dextromethorphan (PROMETHAZINE-DM) 6.25-15 MG/5ML syrup, Take 5 mLs by mouth 4 (four) times daily as needed., Disp: 118 mL, Rfl: 0   bismuth subsalicylate (PEPTO-BISMOL) 262 MG chewable tablet, Chew 2 tablets (524 mg total) by mouth in the morning and at bedtime., Disp: 60 tablet, Rfl: 0   busPIRone (BUSPAR) 7.5 MG tablet, Take 1 tablet (7.5 mg total) by mouth 2 (two) times daily., Disp: 60 tablet, Rfl: 0   dicyclomine (BENTYL) 20 MG tablet, Take 1 tablet (20 mg total) by mouth 2 (two) times daily as needed for spasms., Disp: 60 tablet, Rfl: 0   diphenhydrAMINE (BENADRYL) 25 MG tablet, Take 50 mg by mouth every 6 (six) hours as needed for itching or  allergies. , Disp: , Rfl:    EPINEPHrine (EPIPEN 2-PAK) 0.3 mg/0.3 mL IJ SOAJ injection, Inject 0.3 mg into the muscle once as needed (for severe allergic reaction)., Disp: , Rfl:    meloxicam (MOBIC) 7.5 MG tablet, Take 1 tablet by mouth 2 (two) times daily with a meal., Disp: , Rfl:    omeprazole (PRILOSEC) 40 MG capsule, Take 1 capsule (40 mg total) by mouth daily., Disp: 30 capsule, Rfl: 1   ondansetron (ZOFRAN) 4 MG tablet, Take 1 tablet (4 mg total) by mouth every 8 (eight) hours as needed for nausea or vomiting., Disp: 30 tablet, Rfl: 1   Wheat Dextrin (BENEFIBER DRINK MIX PO), as directed Orally, Disp: , Rfl:   Observations/Objective: Patient is well-developed, well-nourished in no acute distress.  Resting comfortably at home.  Head is normocephalic, atraumatic.  No labored breathing.  Speech is clear and coherent with logical content.  Patient is alert and oriented at baseline.    Assessment and Plan: 1. Acute bacterial bronchitis - azithromycin (ZITHROMAX) 250 MG tablet; Take  2 tablets on day 1, then 1 tablet daily on days 2 through 5  Dispense: 6 tablet; Refill: 0 - predniSONE (DELTASONE) 20 MG tablet; Take 2 tablets (40 mg total) by mouth daily with breakfast.  Dispense: 14 tablet; Refill: 0 - promethazine-dextromethorphan (PROMETHAZINE-DM) 6.25-15 MG/5ML syrup; Take 5 mLs by mouth 4 (four) times daily as needed.  Dispense: 118 mL; Refill: 0 - albuterol (VENTOLIN HFA) 108 (90 Base) MCG/ACT inhaler; Inhale 1-2 puffs into the lungs every 6 (six) hours as needed.  Dispense: 18 g; Refill: 0  - Worsening over a week despite OTC medications - Will treat with Z-pack, Prednisone, Promethazine DM - Can add Mucinex  - Albuterol refilled - Push fluids.  - Rest.  - Steam and humidifier can help - Seek in person evaluation if worsening or symptoms fail to improve    Follow Up Instructions: I discussed the assessment and treatment plan with the patient. The patient was provided an  opportunity to ask questions and all were answered. The patient agreed with the plan and demonstrated an understanding of the instructions.  A copy of instructions were sent to the patient via MyChart unless otherwise noted below.    The patient was advised to call back or seek an in-person evaluation if the symptoms worsen or if the condition fails to improve as anticipated.   [name], PA-C

## 2023-08-14 NOTE — Patient Instructions (Signed)
Brandi Morris, thank you for joining Brandi Loveless, PA-C for today's virtual visit.  While this provider is not your primary care provider (PCP), if your PCP is located in our provider database this encounter information will be shared with them immediately following your visit.   A [facility] MyChart account gives you access to today's visit and all your visits, tests, and labs performed at [facility] " click here if you don't have a [facility] MyChart account or go to [email or web address]  Consent: (Patient) Brandi Morris provided verbal consent for this virtual visit at the beginning of the encounter.  Current Medications:  Current Outpatient Medications:    albuterol (VENTOLIN HFA) 108 (90 Base) MCG/ACT inhaler, Inhale 1-2 puffs into the lungs every 6 (six) hours as needed., Disp: 18 g, Rfl: 0   azithromycin (ZITHROMAX) 250 MG tablet, Take 2 tablets on day 1, then 1 tablet daily on days 2 through 5, Disp: 6 tablet, Rfl: 0   predniSONE (DELTASONE) 20 MG tablet, Take 2 tablets (40 mg total) by mouth daily with breakfast., Disp: 14 tablet, Rfl: 0   promethazine-dextromethorphan (PROMETHAZINE-DM) 6.25-15 MG/5ML syrup, Take 5 mLs by mouth 4 (four) times daily as needed., Disp: 118 mL, Rfl: 0   bismuth subsalicylate (PEPTO-BISMOL) 262 MG chewable tablet, Chew 2 tablets (524 mg total) by mouth in the morning and at bedtime., Disp: 60 tablet, Rfl: 0   busPIRone (BUSPAR) 7.5 MG tablet, Take 1 tablet (7.5 mg total) by mouth 2 (two) times daily., Disp: 60 tablet, Rfl: 0   dicyclomine (BENTYL) 20 MG tablet, Take 1 tablet (20 mg total) by mouth 2 (two) times daily as needed for spasms., Disp: 60 tablet, Rfl: 0   diphenhydrAMINE (BENADRYL) 25 MG tablet, Take 50 mg by mouth every 6 (six) hours as needed for itching or allergies. , Disp: , Rfl:    EPINEPHrine (EPIPEN 2-PAK) 0.3 mg/0.3 mL IJ SOAJ injection, Inject 0.3 mg into the muscle once as needed (for severe allergic  reaction)., Disp: , Rfl:    meloxicam (MOBIC) 7.5 MG tablet, Take 1 tablet by mouth 2 (two) times daily with a meal., Disp: , Rfl:    omeprazole (PRILOSEC) 40 MG capsule, Take 1 capsule (40 mg total) by mouth daily., Disp: 30 capsule, Rfl: 1   ondansetron (ZOFRAN) 4 MG tablet, Take 1 tablet (4 mg total) by mouth every 8 (eight) hours as needed for nausea or vomiting., Disp: 30 tablet, Rfl: 1   Wheat Dextrin (BENEFIBER DRINK MIX PO), as directed Orally, Disp: , Rfl:    Medications ordered in this encounter:  Meds ordered this encounter  Medications   azithromycin (ZITHROMAX) 250 MG tablet    Sig: Take 2 tablets on day 1, then 1 tablet daily on days 2 through 5    Dispense:  6 tablet    Refill:  0    Order Specific Question:   Supervising Provider    Answer:   Merrilee Jansky [[id number]]   predniSONE (DELTASONE) 20 MG tablet    Sig: Take 2 tablets (40 mg total) by mouth daily with breakfast.    Dispense:  14 tablet    Refill:  0    Order Specific Question:   Supervising Provider    Answer:   Merrilee Jansky [[id number]]   promethazine-dextromethorphan (PROMETHAZINE-DM) 6.25-15 MG/5ML syrup    Sig: Take 5 mLs by mouth 4 (four) times daily as needed.    Dispense:  118 mL    Refill:  0    Order Specific Question:   Supervising Provider    Answer:   Merrilee Jansky [id number]   albuterol (VENTOLIN HFA) 108 (90 Base) MCG/ACT inhaler    Sig: Inhale 1-2 puffs into the lungs every 6 (six) hours as needed.    Dispense:  18 g    Refill:  0    Order Specific Question:   Supervising Provider    Answer:   Merrilee Jansky [id number]     *If you need refills on other medications prior to your next appointment, please contact your pharmacy*  Follow-Up: Call back or seek an in-person evaluation if the symptoms worsen or if the condition fails to improve as anticipated.   Virtual Care [phone]  Other Instructions Acute Bronchitis, Adult  Acute bronchitis is sudden  inflammation of the main airways (bronchi) that come off the windpipe (trachea) in the lungs. The swelling causes the airways to get smaller and make more mucus than normal. This can make it hard to breathe and can cause coughing or noisy breathing (wheezing). Acute bronchitis may last several weeks. The cough may last longer. Allergies, asthma, and exposure to smoke may make the condition worse. What are the causes? This condition can be caused by germs and by substances that irritate the lungs, including: Cold and flu viruses. The most common cause of this condition is the virus that causes the common cold. Bacteria. This is less common. Breathing in substances that irritate the lungs, including: Smoke from cigarettes and other forms of tobacco. Dust and pollen. Fumes from household cleaning products, gases, or burned fuel. Indoor or outdoor air pollution. What increases the risk? The following factors may make you more likely to develop this condition: A weak body's defense system, also called the immune system. A condition that affects your lungs and breathing, such as asthma. What are the signs or symptoms? Common symptoms of this condition include: Coughing. This may bring up clear, yellow, or green mucus from your lungs (sputum). Wheezing. Runny or stuffy nose. Having too much mucus in your lungs (chest congestion). Shortness of breath. Aches and pains, including sore throat or chest. How is this diagnosed? This condition is usually diagnosed based on: Your symptoms and medical history. A physical exam. You may also have other tests, including tests to rule out other conditions, such as pneumonia. These tests include: A test of lung function. Test of a mucus sample to look for the presence of bacteria. Tests to check the oxygen level in your blood. Blood tests. Chest X-ray. How is this treated? Most cases of acute bronchitis clear up over time without treatment. Your health  care provider may recommend: Drinking more fluids to help thin your mucus so it is easier to cough up. Taking inhaled medicine (inhaler) to improve air flow in and out of your lungs. Using a vaporizer or a humidifier. These are machines that add water to the air to help you breathe better. Taking a medicine that thins mucus and clears congestion (expectorant). Taking a medicine that prevents or stops coughing (cough suppressant). It is not common to take an antibiotic medicine for this condition. Follow these instructions at home:  Take over-the-counter and prescription medicines only as told by your health care provider. Use an inhaler, vaporizer, or humidifier as told by your health care provider. Take two teaspoons (10 mL) of honey at bedtime to lessen coughing at night. Drink enough fluid to keep your urine pale yellow. Do not use  any products that contain nicotine or tobacco. These products include cigarettes, chewing tobacco, and vaping devices, such as e-cigarettes. If you need help quitting, ask your health care provider. Get plenty of rest. Return to your normal activities as told by your health care provider. Ask your health care provider what activities are safe for you. Keep all follow-up visits. This is important. How is this prevented? To lower your risk of getting this condition again: Wash your hands often with soap and water for at least 20 seconds. If soap and water are not available, use hand sanitizer. Avoid contact with people who have cold symptoms. Try not to touch your mouth, nose, or eyes with your hands. Avoid breathing in smoke or chemical fumes. Breathing smoke or chemical fumes will make your condition worse. Get the flu shot every year. Contact a health care provider if: Your symptoms do not improve after 2 weeks. You have trouble coughing up the mucus. Your cough keeps you awake at night. You have a fever. Get help right away if you: Cough up blood. Feel  pain in your chest. Have severe shortness of breath. Faint or keep feeling like you are going to faint. Have a severe headache. Have a fever or chills that get worse. These symptoms may represent a serious problem that is an emergency. Do not wait to see if the symptoms will go away. Get medical help right away. Call your local emergency services (911 in the [place] Do not drive yourself to the hospital. Summary Acute bronchitis is inflammation of the main airways (bronchi) that come off the windpipe (trachea) in the lungs. The swelling causes the airways to get smaller and make more mucus than normal. Drinking more fluids can help thin your mucus so it is easier to cough up. Take over-the-counter and prescription medicines only as told by your health care provider. Do not use any products that contain nicotine or tobacco. These products include cigarettes, chewing tobacco, and vaping devices, such as e-cigarettes. If you need help quitting, ask your health care provider. Contact a health care provider if your symptoms do not improve after 2 weeks. This information is not intended to replace advice given to you by your health care provider. Make sure you discuss any questions you have with your health care provider. Document Revised: 02/14/2022 Document Reviewed: 03/07/2021 Elsevier Patient Education  2024 Elsevier Inc.    If you have been instructed to have an in-person evaluation today at a local Urgent Care facility, please use the link below. It will take you to a list of all of our available [facility], including address, phone number and hours of operation. Please do not delay care.  [facility]  If you or a family member do not have a primary care provider, use the link below to schedule a visit and establish care. When you choose a  primary care physician or advanced practice provider, you gain a long-term partner in health. Find a Primary Care  Provider  Learn more about 's in-office and virtual care options:  - Get Care Now

## 2023-08-19 DIAGNOSIS — Z419 Encounter for procedure for purposes other than remedying health state, unspecified: Secondary | ICD-10-CM | POA: Diagnosis not present

## 2023-08-27 ENCOUNTER — Telehealth: Payer: Self-pay | Admitting: Gastroenterology

## 2023-08-27 MED ORDER — PEG 3350-KCL-NA BICARB-NACL 420 G PO SOLR: ORAL | 0 refills | Status: DC

## 2023-08-27 NOTE — Telephone Encounter (Signed)
Patient called stating that her prescription to prep had not been sent. I apologized and sent it to her pharmacy.

## 2023-08-27 NOTE — Addendum Note (Signed)
Addended by: Adela Ports on: 08/27/2023 10:30 AM   Modules accepted: Orders

## 2023-08-27 NOTE — Telephone Encounter (Signed)
Patient called in for her prep for her procedure tomorrow.

## 2023-08-28 ENCOUNTER — Encounter
Admission: RE | Disposition: A | Discharge status: Home / Self Care | Payer: Self-pay | Source: Home / Self Care | Attending: Gastroenterology

## 2023-08-28 ENCOUNTER — Ambulatory Visit: Payer: 59 | Admitting: Anesthesiology

## 2023-08-28 ENCOUNTER — Encounter: Payer: Self-pay | Admitting: Gastroenterology

## 2023-08-28 ENCOUNTER — Ambulatory Visit
Admission: RE | Admit: 2023-08-28 | Discharge: 2023-08-28 | Disposition: A | Discharge status: Home / Self Care | Payer: 59 | Attending: Gastroenterology | Admitting: Gastroenterology

## 2023-08-28 DIAGNOSIS — K5730 Diverticulosis of large intestine without perforation or abscess without bleeding: Secondary | ICD-10-CM | POA: Insufficient documentation

## 2023-08-28 DIAGNOSIS — D128 Benign neoplasm of rectum: Secondary | ICD-10-CM

## 2023-08-28 DIAGNOSIS — Z90710 Acquired absence of both cervix and uterus: Secondary | ICD-10-CM | POA: Insufficient documentation

## 2023-08-28 DIAGNOSIS — R112 Nausea with vomiting, unspecified: Secondary | ICD-10-CM | POA: Diagnosis present

## 2023-08-28 DIAGNOSIS — F17210 Nicotine dependence, cigarettes, uncomplicated: Secondary | ICD-10-CM | POA: Insufficient documentation

## 2023-08-28 DIAGNOSIS — D124 Benign neoplasm of descending colon: Secondary | ICD-10-CM | POA: Diagnosis not present

## 2023-08-28 DIAGNOSIS — Z1211 Encounter for screening for malignant neoplasm of colon: Secondary | ICD-10-CM | POA: Diagnosis not present

## 2023-08-28 DIAGNOSIS — R197 Diarrhea, unspecified: Secondary | ICD-10-CM

## 2023-08-28 DIAGNOSIS — Z9049 Acquired absence of other specified parts of digestive tract: Secondary | ICD-10-CM | POA: Diagnosis not present

## 2023-08-28 DIAGNOSIS — K2970 Gastritis, unspecified, without bleeding: Secondary | ICD-10-CM | POA: Insufficient documentation

## 2023-08-28 DIAGNOSIS — D126 Benign neoplasm of colon, unspecified: Secondary | ICD-10-CM

## 2023-08-28 DIAGNOSIS — K621 Rectal polyp: Secondary | ICD-10-CM | POA: Diagnosis not present

## 2023-08-28 HISTORY — PX: HEMOSTASIS CLIP PLACEMENT: SHX6857

## 2023-08-28 HISTORY — PX: POLYPECTOMY: SHX5525

## 2023-08-28 HISTORY — PX: BIOPSY: SHX5522

## 2023-08-28 HISTORY — PX: ESOPHAGOGASTRODUODENOSCOPY (EGD) WITH PROPOFOL: SHX5813

## 2023-08-28 HISTORY — PX: COLONOSCOPY WITH PROPOFOL: SHX5780

## 2023-08-28 SURGERY — ESOPHAGOGASTRODUODENOSCOPY (EGD) WITH PROPOFOL
Anesthesia: General

## 2023-08-28 MED ORDER — MIDAZOLAM HCL 2 MG/2ML IJ SOLN
INTRAMUSCULAR | Status: DC | PRN
Start: 2023-08-28 — End: 2023-08-28
  Administered 2023-08-28: 2 mg via INTRAVENOUS

## 2023-08-28 MED ORDER — DEXMEDETOMIDINE HCL IN NACL 200 MCG/50ML IV SOLN
INTRAVENOUS | Status: DC | PRN
Start: 2023-08-28 — End: 2023-08-28
  Administered 2023-08-28: 12 ug via INTRAVENOUS

## 2023-08-28 MED ORDER — PROPOFOL 10 MG/ML IV BOLUS
INTRAVENOUS | Status: DC | PRN
  Administered 2023-08-28: 30 mg via INTRAVENOUS
  Administered 2023-08-28: 70 mg via INTRAVENOUS

## 2023-08-28 MED ORDER — SODIUM CHLORIDE 0.9 % IV SOLN: INTRAVENOUS | Status: DC

## 2023-08-28 MED ORDER — MIDAZOLAM HCL 2 MG/2ML IJ SOLN
INTRAMUSCULAR | Status: AC
  Filled 2023-08-28: qty 2

## 2023-08-28 MED ORDER — LIDOCAINE HCL (CARDIAC) PF 100 MG/5ML IV SOSY
PREFILLED_SYRINGE | INTRAVENOUS | Status: DC | PRN
  Administered 2023-08-28: 100 mg via INTRAVENOUS

## 2023-08-28 MED ORDER — PROPOFOL 500 MG/50ML IV EMUL
INTRAVENOUS | Status: DC | PRN
  Administered 2023-08-28: 165 ug/kg/min via INTRAVENOUS

## 2023-08-28 NOTE — Transfer of Care (Signed)
Immediate Anesthesia Transfer of Care Note  Patient: Brandi Morris  Procedure(s) Performed: ESOPHAGOGASTRODUODENOSCOPY (EGD) WITH PROPOFOL COLONOSCOPY WITH PROPOFOL BIOPSY POLYPECTOMY HEMOSTASIS CLIP PLACEMENT  Patient Location: Endoscopy Unit  Anesthesia Type:General  Level of Consciousness: awake and drowsy  Airway & Oxygen Therapy: Patient Spontanous Breathing and Patient connected to face mask oxygen  Post-op Assessment: Report given to RN and Post -op Vital signs reviewed and stable  Post vital signs: Reviewed and stable  Last Vitals:  Vitals Value Taken Time  BP    Temp    Pulse 105 08/28/23 0918  Resp 19 08/28/23 0918  SpO2 93 % 08/28/23 0918  Vitals shown include unfiled device data.  Last Pain:  Vitals:   08/28/23 0805  TempSrc: Temporal  PainSc: 0-No pain         Complications: No notable events documented.

## 2023-08-28 NOTE — H&P (Signed)
Wyline Mood, MD [address], [address], [place], [place], [address] [address], [address], [place], [place], [address] Phone: [phone]  Fax: [phone]  Primary Care Physician:  Jac Canavan, PA-C   Pre-Procedure History & Physical: HPI:  Brandi Morris is a 47 y.o. 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 Authorizing Provider  albuterol (VENTOLIN HFA) 108 (90 Base) MCG/ACT inhaler Inhale 1-2 puffs into the lungs every 6 (six) hours as needed. 08/14/23   [name], PA-C  bismuth subsalicylate (PEPTO-BISMOL) 262 MG chewable tablet Chew 2 tablets (524 mg total) by mouth in the morning and at bedtime. 05/20/23   Tysinger, Kermit Balo, PA-C  busPIRone (BUSPAR) 7.5 MG tablet Take 1 tablet (7.5 mg total) by mouth 2 (two) times daily. 05/20/23   Tysinger, Kermit Balo, PA-C  dicyclomine (BENTYL) 20 MG tablet Take 1 tablet (20 mg total) by mouth 2 (two) times daily as needed for spasms. 05/20/23   Tysinger, Kermit Balo, PA-C  diphenhydrAMINE (BENADRYL) 25 MG tablet Take 50 mg by mouth every 6 (six) hours as needed for itching or allergies.     [provider]  EPINEPHrine (EPIPEN  2-PAK) 0.3 mg/0.3 mL IJ SOAJ injection Inject 0.3 mg into the muscle once as needed (for severe allergic reaction).    [provider]  meloxicam (MOBIC) 7.5 MG tablet Take 1 tablet by mouth 2 (two) times daily with a meal.    [provider]  omeprazole (PRILOSEC) 40 MG capsule Take 1 capsule (40 mg total) by mouth daily. 05/20/23   Tysinger, Kermit Balo, PA-C  ondansetron (ZOFRAN) 4 MG tablet Take 1 tablet (4 mg total) by mouth every 8 (eight) hours as needed for nausea or vomiting. 05/20/23   Tysinger, Kermit Balo, PA-C  polyethylene glycol-electrolytes (NULYTELY) 420 g solution Prepare according to package instructions. Starting at 5:00 PM: Drink one 8 oz glass of mixture every 15 minutes until you finish half of the jug. Five hours prior to procedure, drink 8 oz glass of mixture every 15 minutes until it is all gone. Make sure you do not drink anything 4 hours prior to your procedure. 08/27/23   Celso Amy, PA-C  predniSONE (DELTASONE) 20 MG tablet Take 2 tablets (40 mg total) by mouth daily with breakfast. 08/14/23   [name], PA-C  promethazine-dextromethorphan (PROMETHAZINE-DM) 6.25-15 MG/5ML syrup Take 5 mLs by mouth 4 (four) times daily as needed. 08/14/23  Joycelyn Man M, PA-C  Wheat Dextrin (BENEFIBER DRINK MIX PO) as directed Orally    [provider]    Allergies as of 07/10/2023 - Review Complete 07/10/2023  Allergen Reaction Noted   Bee venom Anaphylaxis 04/10/2014   Flexeril [cyclobenzaprine] Other (See Comments) 01/17/2014   Lavender oil Anaphylaxis and Other (See Comments) 01/17/2014   Robaxin [methocarbamol] Other (See Comments) 01/17/2014   Shellfish allergy Hives 04/10/2014   Amitriptyline Other (See Comments) 12/22/2011   Entex Other (See Comments) 07/30/2007   Singulair [montelukast sodium] Other (See Comments) 05/13/2015   Etodolac Other (See Comments) 07/30/2007   Iodine Hives 04/10/2014   Mushroom extract complex Hives 04/10/2014     Family History  Problem Relation Age of Onset   Heart disease Mother    COPD Mother    Diabetes Father    Hypertension Father    Stroke Father    Heart disease Father    Breast cancer Maternal Grandmother    Cancer Maternal Grandfather        melanoma   Heart disease Paternal Grandmother    Stroke Paternal Grandmother    Heart attack Paternal Grandmother    Heart disease Paternal Grandfather    Stroke Paternal Grandfather    Heart attack Paternal Grandfather     Social History   Socioeconomic History   Marital status: Divorced    Spouse name: Not on file   Number of children: 3   Years of education: 15   Highest education level: Not on file  Occupational History   Occupation: Holiday representative  Tobacco Use   Smoking status: Every Day    Current packs/day: 1.00    Average packs/day: 1 pack/day for 25.0 years (25.0 ttl pk-yrs)    Types: Cigarettes   Smokeless tobacco: Never  Vaping Use   Vaping status: Never Used  Substance and Sexual Activity   Alcohol use: Not Currently   Drug use: No   Sexual activity: Yes    Birth control/protection: Surgical  Other Topics Concern   Not on file  Social History Narrative   Fun: Fish and hunt   Denies abuse and feels safe at home.    Social Determinants of Health   Financial Resource Strain: Not on file  Food Insecurity: Not on file  Transportation Needs: No Transportation Needs (06/13/2023)   PRAPARE - Administrator, Civil Service (Medical): No    Lack of Transportation (Non-Medical): No  Physical Activity: Not on file  Stress: Not on file  Social Connections: Not on file  Intimate Partner Violence: Not on file    Review of Systems: See HPI, otherwise negative ROS  Physical Exam: BP 102/71   Pulse 77   Temp (!) 96.4 F (35.8 C) (Temporal)   Resp 18   Ht 5\' 2"  (1.575 m)   Wt 74.4 kg   LMP 03/03/2007   SpO2 97%   BMI 30.00 kg/m  General:   Alert,  pleasant and cooperative in NAD Head:   Normocephalic and atraumatic. Neck:  Supple; no masses or thyromegaly. Lungs:  Clear throughout to auscultation, normal respiratory effort.    Heart:  +S1, +S2, Regular rate and rhythm, No edema. Abdomen:  Soft, nontender and nondistended. Normal bowel sounds, without guarding, and without rebound.   Neurologic:  Alert and  oriented x4;  grossly normal neurologically.  Impression/Plan: Brandi Morris is here for an endoscopy and colonoscopy  to be performed for  evaluation of nausea,vomiting and colon cancer screening  Risks, benefits, limitations, and alternatives regarding endoscopy have been reviewed with the patient.  Questions have been answered.  All parties agreeable.   Wyline Mood, MD  08/28/2023, 8:14 AM

## 2023-08-28 NOTE — Anesthesia Procedure Notes (Signed)
Procedure Name: General with mask airway Date/Time: 08/28/2023 8:49 AM  Performed by: Mohammed Kindle, CRNAPre-anesthesia Checklist: Patient identified, Emergency Drugs available, Suction available and Patient being monitored Patient Re-evaluated:Patient Re-evaluated prior to induction Oxygen Delivery Method: Simple face mask Induction Type: IV induction Placement Confirmation: positive ETCO2, CO2 detector and breath sounds checked- equal and bilateral Dental Injury: Teeth and Oropharynx as per pre-operative assessment

## 2023-08-28 NOTE — Op Note (Signed)
[facility] Patient Name: Brandi Morris Procedure Date: 08/28/2023 8:44 AM MRN: [id number] Account #: [id number] Date of Birth: Feb 04, 1976 Admit Type: Outpatient Age: 47 Room: [facility] ENDO ROOM 2 Gender: Female Note Status: Finalized Instrument Name: Patton Salles Endoscope [id number] Procedure:             Upper GI endoscopy Indications:           Nausea with vomiting Providers:             Wyline Mood MD, MD Medicines:             Monitored Anesthesia Care Complications:         No immediate complications. Procedure:             Pre-Anesthesia Assessment:                        - Prior to the procedure, a History and Physical was                         performed, and patient medications, allergies and                         sensitivities were reviewed. The patient's tolerance                         of previous anesthesia was reviewed.                        - The risks and benefits of the procedure and the                         sedation options and risks were discussed with the                         patient. All questions were answered and informed                         consent was obtained.                        - ASA Grade Assessment: II - A patient with mild                         systemic disease.                        After obtaining informed consent, the endoscope was                         passed under direct vision. Throughout the procedure,                         the patient's blood pressure, pulse, and oxygen                         saturations were monitored continuously. The Endoscope                         was introduced through the mouth, and advanced to the  third part of duodenum. The upper GI endoscopy was                         accomplished with ease. The patient tolerated the                         procedure well. Findings:      The esophagus was normal.      The examined duodenum was normal.       Localized mild inflammation characterized by congestion (edema) and       erythema was found in the gastric antrum. Biopsies were taken with a       cold forceps for histology.      The cardia and gastric fundus were normal on retroflexion. Impression:            - Normal esophagus.                        - Normal examined duodenum.                        - Gastritis. Biopsied. Recommendation:        - Await pathology results.                        - Perform a colonoscopy today. Procedure Code(s):     --- Professional ---                        [phone], Esophagogastroduodenoscopy, flexible,                         transoral; with biopsy, single or multiple Diagnosis Code(s):     --- Professional ---                        K29.70, Gastritis, unspecified, without bleeding                        R11.2, Nausea with vomiting, unspecified CPT copyright 2022 American Medical Association. All rights reserved. The codes documented in this report are preliminary and upon coder review may  be revised to meet current compliance requirements. Wyline Mood, MD Wyline Mood MD, MD 08/28/2023 8:52:30 AM This report has been signed electronically. Number of Addenda: 0 Note Initiated On: 08/28/2023 8:44 AM Estimated Blood Loss:  Estimated blood loss: none.      [facility]

## 2023-08-28 NOTE — Anesthesia Postprocedure Evaluation (Signed)
Anesthesia Post Note  Patient: Enola H Haselton  Procedure(s) Performed: ESOPHAGOGASTRODUODENOSCOPY (EGD) WITH PROPOFOL COLONOSCOPY WITH PROPOFOL BIOPSY POLYPECTOMY HEMOSTASIS CLIP PLACEMENT  Patient location during evaluation: PACU Anesthesia Type: General Level of consciousness: awake and alert Pain management: pain level controlled Vital Signs Assessment: post-procedure vital signs reviewed and stable Respiratory status: spontaneous breathing, nonlabored ventilation, respiratory function stable and patient connected to nasal cannula oxygen Cardiovascular status: blood pressure returned to baseline and stable Postop Assessment: no apparent nausea or vomiting Anesthetic complications: no   No notable events documented.   Last Vitals:  Vitals:   08/28/23 0937 08/28/23 0942  BP: 99/72 98/78  Pulse: 83 77  Resp: 17 10  Temp:    SpO2: 97% 98%    Last Pain:  Vitals:   08/28/23 0942  TempSrc:   PainSc: 0-No pain                 Yevette Edwards

## 2023-08-28 NOTE — Anesthesia Preprocedure Evaluation (Signed)
Anesthesia Evaluation  Patient identified by MRN, date of birth, ID band Patient awake    Reviewed: Allergy & Precautions, H&P , NPO status , Patient's Chart, lab work & pertinent test results, reviewed documented beta blocker date and time   Airway Mallampati: II   Neck ROM: full    Dental  (+) Poor Dentition   Pulmonary asthma , Current Smoker   Pulmonary exam normal        Cardiovascular Exercise Tolerance: Good negative cardio ROS Normal cardiovascular exam Rhythm:regular Rate:Normal     Neuro/Psych  Headaches PSYCHIATRIC DISORDERS Anxiety Depression    CVA    GI/Hepatic negative GI ROS, Neg liver ROS,,,  Endo/Other  negative endocrine ROS    Renal/GU Renal disease  negative genitourinary   Musculoskeletal   Abdominal   Peds  Hematology negative hematology ROS (+)   Anesthesia Other Findings Past Medical History: No date: Asthma     Comment:  Exercise induced No date: Endometriosis No date: Hyperlipidemia No date: Interstitial cystitis No date: Kidney stone No date: Migraine No date: Multiple allergies No date: Stroke [facility])     Comment:  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 Past Surgical History: No date: ABDOMINAL HYSTERECTOMY No date: ABLATION No date: CERVICAL BIOPSY  W/ LOOP ELECTRODE EXCISION No date: CHOLECYSTECTOMY No date: COLPOSCOPY No date: CYSTOSTOMY W/ BLADDER BIOPSY No date: KNEE SURGERY No date: LEEP No date: RHINOPLASTY 07/26/2015: TENDON REPAIR; Left     Comment:  Procedure: REPAIR WITH RECONSTRUCTION LEFT THUMB;                Surgeon: Dairl Ponder, MD;  Location: [facility] OR;                Service: Orthopedics;  Laterality: Left; No date: TONSILLECTOMY No date: TUBAL LIGATION BMI    Body Mass Index: 30.00 kg/m     Reproductive/Obstetrics negative OB ROS                             Anesthesia Physical Anesthesia Plan  ASA: 3  Anesthesia Plan:  General   Post-op Pain Management:    Induction:   PONV Risk Score and Plan:   Airway Management Planned:   Additional Equipment:   Intra-op Plan:   Post-operative Plan:   Informed Consent: I have reviewed the patients History and Physical, chart, labs and discussed the procedure including the risks, benefits and alternatives for the proposed anesthesia with the patient or authorized representative who has indicated his/her understanding and acceptance.     Dental Advisory Given  Plan Discussed with: CRNA  Anesthesia Plan Comments:        Anesthesia Quick Evaluation

## 2023-08-28 NOTE — Op Note (Signed)
[facility] Gastroenterology Patient Name: Brandi Morris Procedure Date: 08/28/2023 8:43 AM MRN: [id number] Account #: [id number] Date of Birth: Apr 17, 1976 Admit Type: Outpatient Age: 47 Room: [facility] ENDO ROOM 2 Gender: Female Note Status: Finalized Instrument Name: Nelda Marseille [id number] Procedure:             Colonoscopy Indications:           Screening for colorectal malignant neoplasm Providers:             Wyline Mood MD, MD Referring MD:          No Local Md, MD (Referring MD) Medicines:             Monitored Anesthesia Care Complications:         No immediate complications. Procedure:             Pre-Anesthesia Assessment:                        - Prior to the procedure, a History and Physical was                         performed, and patient medications, allergies and                         sensitivities were reviewed. The patient's tolerance                         of previous anesthesia was reviewed.                        - The risks and benefits of the procedure and the                         sedation options and risks were discussed with the                         patient. All questions were answered and informed                         consent was obtained.                        - ASA Grade Assessment: II - A patient with mild                         systemic disease.                        After obtaining informed consent, the colonoscope was                         passed under direct vision. Throughout the procedure,                         the patient's blood pressure, pulse, and oxygen                         saturations were monitored continuously. The                         Colonoscope was  introduced through the anus and                         advanced to the the cecum, identified by the                         appendiceal orifice. The colonoscopy was performed                         with ease. The patient tolerated the procedure well.                          The quality of the bowel preparation was good. The                         ileocecal valve, appendiceal orifice, and rectum were                         photographed. Findings:      The perianal and digital rectal examinations were normal.      Two sessile polyps were found in the rectum. The polyps were 5 to 6 mm       in size. These polyps were removed with a cold snare. Resection and       retrieval were complete.      Four sessile polyps were found in the descending colon. The polyps were       5 to 8 mm in size. These polyps were removed with a cold snare.       Resection and retrieval were complete. To prevent bleeding       post-intervention, one hemostatic clip was successfully placed. Clip       manufacturer: [employer]. There was no bleeding at the end of the       procedure.      Normal mucosa was found in the entire colon. Biopsies for histology were       taken with a cold forceps for evaluation of microscopic colitis.      The exam was otherwise without abnormality on direct and retroflexion       views.      Multiple small-mouthed diverticula were found in the left colon.      The exam was otherwise without abnormality on direct and retroflexion       views. Impression:            - Two 5 to 6 mm polyps in the rectum, removed with a                         cold snare. Resected and retrieved.                        - Four 5 to 8 mm polyps in the descending colon,                         removed with a cold snare. Resected and retrieved.                         Clip was placed. Clip manufacturer: [employer].                        -  Normal mucosa in the entire examined colon. Biopsied.                        - The examination was otherwise normal on direct and                         retroflexion views.                        - Diverticulosis in the left colon.                        - The examination was otherwise normal on direct and                          retroflexion views. Recommendation:        - Discharge patient to home (with escort).                        - Resume previous diet.                        - Continue present medications.                        - Await pathology results.                        - Repeat colonoscopy in 3 years for surveillance. Procedure Code(s):     --- Professional ---                        [phone], Colonoscopy, flexible; with removal of                         tumor(s), polyp(s), or other lesion(s) by snare                         technique                        45380, 59, Colonoscopy, flexible; with biopsy, single                         or multiple Diagnosis Code(s):     --- Professional ---                        Z12.11, Encounter for screening for malignant neoplasm                         of colon                        D12.8, Benign neoplasm of rectum                        D12.4, Benign neoplasm of descending colon                        K57.30, Diverticulosis of large intestine without  perforation or abscess without bleeding CPT copyright 2022 American Medical Association. All rights reserved. The codes documented in this report are preliminary and upon coder review may  be revised to meet current compliance requirements. Wyline Mood, MD Wyline Mood MD, MD 08/28/2023 9:11:58 AM This report has been signed electronically. Number of Addenda: 0 Note Initiated On: 08/28/2023 8:43 AM Scope Withdrawal Time: 0 hours 12 minutes 51 seconds  Total Procedure Duration: 0 hours 14 minutes 49 seconds  Estimated Blood Loss:  Estimated blood loss: none.      [facility]

## 2023-08-29 ENCOUNTER — Encounter: Payer: Self-pay | Admitting: Gastroenterology

## 2023-08-29 LAB — SURGICAL PATHOLOGY

## 2023-08-31 ENCOUNTER — Encounter: Payer: Self-pay | Admitting: Gastroenterology

## 2023-09-01 ENCOUNTER — Telehealth: Payer: Self-pay | Admitting: Physician Assistant

## 2023-09-01 NOTE — Telephone Encounter (Signed)
Patient called in and left a voicemail she is worried about her results and I called her back letting her know we receive her voicemail. I transfer to [facility].

## 2023-09-02 ENCOUNTER — Telehealth: Payer: Self-pay

## 2023-09-02 NOTE — Telephone Encounter (Signed)
Questions answered. Patient verbalized understanding- placed in recall box for 3 year follow up on colonoscopy. Patient currently taking Omeprazole.    Patient called in and left a voicemail she is worried about her results and I called her back letting her know we receive her voicemail. I transfer to [facility].

## 2023-09-03 ENCOUNTER — Ambulatory Visit: Payer: Self-pay

## 2023-09-03 NOTE — Patient Instructions (Signed)
Visit Information  Thank you for taking time to visit with me today. Please don't hesitate to contact me if I can be of assistance to you.   Following are the goals we discussed today:   Goals Addressed             This Visit's Progress    To have weight loss and GI symptoms evaluated and treated   On track    Care Coordination Interventions: Evaluation of current treatment plan related to abnormal weight loss; nausea; impaired bowel elimination with abdominal pain   and patient's adherence to plan as established by provider Reviewed and discussed with patient she completed her colonoscopy and EGD as directed with multiple benign polyps removed Review of patient status, including review of consultant's reports, relevant laboratory and other test results, and medications completed Mailed printed educational materials related to diarrhea, FODMAP diet; IBD Reviewed and discussed next scheduled GI follow up visit is scheduled for 09/25/23 @8 :15 AM Discussed plans with patient for ongoing care coordination follow up and provided patient with direct contact information for nurse care coordinator        Our next appointment is by telephone on 09/30/23 at 2:30 PM  Please call the care guide team at [phone] if you need to cancel or reschedule your appointment.   If you are experiencing a Mental Health or Behavioral Health Crisis or need someone to talk to, please call 1-800-273-TALK (toll free, 24 hour hotline)  Patient verbalizes understanding of instructions and care plan provided today and agrees to view in MyChart. Active MyChart status and patient understanding of how to access instructions and care plan via MyChart confirmed with patient.     Delsa Sale RN BSN CCM [place]  [facility], [facility] Nurse Care Coordinator  Direct Dial: [phone] Website: [email or web address]

## 2023-09-03 NOTE — Patient Outreach (Signed)
  Care Coordination   Follow Up Visit Note   09/03/2023 Name: Brandi Morris MRN: [id number] DOB: Sep 06, 1976  Brandi Morris is a 47 y.o. year old female who sees Tysinger, Kermit Balo, PA-C for primary care. I spoke with  Brandi Morris by phone today.  What matters to the patients health and wellness today?  Patient would like to quit smoking. She would like to eat a meal without having stomach pain and or diarrhea.     Goals Addressed             This Visit's Progress    To have weight loss and GI symptoms evaluated and treated   On track    Care Coordination Interventions: Evaluation of current treatment plan related to abnormal weight loss; nausea; impaired bowel elimination with abdominal pain   and patient's adherence to plan as established by provider Reviewed and discussed with patient she completed her colonoscopy and EGD as directed with multiple benign polyps removed Review of patient status, including review of consultant's reports, relevant laboratory and other test results, and medications completed Mailed printed educational materials related to diarrhea, FODMAP diet; IBD Reviewed and discussed next scheduled GI follow up visit is scheduled for 09/25/23 @8 :15 AM Discussed plans with patient for ongoing care coordination follow up and provided patient with direct contact information for nurse care coordinator    Interventions Today    Flowsheet Row Most Recent Value  Chronic Disease   Chronic disease during today's visit Other  [diarrhea unspecified,  s/p colonoscopy/EGD w/polyp removal,  smoking cessation]  General Interventions   General Interventions Discussed/Reviewed General Interventions Discussed, General Interventions Reviewed, Doctor Visits, Labs, Health Screening  Doctor Visits Discussed/Reviewed Doctor Visits Discussed, Doctor Visits Reviewed, Specialist, PCP  Health Screening Colonoscopy  Exercise Interventions   Exercise Discussed/Reviewed Weight  Managment  Weight Management Weight maintenance  Education Interventions   Education Provided Provided Education, Provided Printed Education  Provided Verbal Education On Nutrition, When to see the doctor, Medication, Labs, Mental Health/Coping with Illness  Mental Health Interventions   Mental Health Discussed/Reviewed Mental Health Discussed, Mental Health Reviewed, Coping Strategies  Nutrition Interventions   Nutrition Discussed/Reviewed Nutrition Discussed, Nutrition Reviewed  Pharmacy Interventions   Pharmacy Dicussed/Reviewed Pharmacy Topics Reviewed, Pharmacy Topics Discussed, Medications and their functions          SDOH assessments and interventions completed:  No     Care Coordination Interventions:  Yes, provided   Follow up plan: Follow up call scheduled for 09/30/23 @2 :30 PM    Encounter Outcome:  Patient Visit Completed

## 2023-09-19 ENCOUNTER — Other Ambulatory Visit: Payer: Self-pay | Admitting: Medical Genetics

## 2023-09-19 DIAGNOSIS — Z006 Encounter for examination for normal comparison and control in clinical research program: Secondary | ICD-10-CM

## 2023-09-19 DIAGNOSIS — Z419 Encounter for procedure for purposes other than remedying health state, unspecified: Secondary | ICD-10-CM | POA: Diagnosis not present

## 2023-09-22 ENCOUNTER — Other Ambulatory Visit: Payer: Self-pay | Admitting: Medical

## 2023-09-24 NOTE — Progress Notes (Unsigned)
Celso Amy, PA-C [address]  [address]  [place], [place] [address]  Main: [phone]  Fax: [phone]   Primary Care Physician: Jac Canavan, PA-C  Primary Gastroenterologist:  Celso Amy, PA-C / Dr. Wyline Mood    CC: F/U Abd Pain, N/V, Diarrhea  HPI: Brandi Morris is a 47 y.o. female returns for 87-month follow-up of abdominal pain, nausea, vomiting, and diarrhea ongoing since March 2024.  07/21/2023: GI pathogen panel positive for norovirus, all other infections negative.  C. difficile toxin negative.  Fecal calprotectin normal.  EGD 08/28/2023 by Dr. Tobi Bastos showed mild gastritis, otherwise normal.  Biopsies negative for H. pylori.  Colonoscopy 08/28/2023 showed 2 small (5 mm to 6 mm) rectal polyps removed.  4 small (5 mm to 8 mm) polyps removed from descending colon.  Good prep.  Left colon diverticulosis.  Biopsies negative for microscopic colitis.  Pathology of polyps were tubular adenomas, sessile serrated polyps, and 1 hyperplastic polyp.  No dysplasia.  3-year repeat colonoscopy.  Labs 04/23/2023 showed normal CBC, CMP, TSH, and lipase.     CT chest abdomen and pelvis with contrast 04/2023 showed mild emphysema, right adrenal adenoma benign, left nephrolithiasis, sigmoid diverticulosis but no diverticulitis, previous cholecystectomy and hysterectomy.  Current Outpatient Medications  Medication Sig Dispense Refill   albuterol (VENTOLIN HFA) 108 (90 Base) MCG/ACT inhaler Inhale 1-2 puffs into the lungs every 6 (six) hours as needed. 18 g 0   bismuth subsalicylate (PEPTO-BISMOL) 262 MG chewable tablet Chew 2 tablets (524 mg total) by mouth in the morning and at bedtime. 60 tablet 0   busPIRone (BUSPAR) 7.5 MG tablet Take 1 tablet (7.5 mg total) by mouth 2 (two) times daily. 60 tablet 0   dicyclomine (BENTYL) 20 MG tablet TAKE 1 TABLET BY MOUTH 2 TIMES DAILY AS NEEDED FOR SPASMS. 60 tablet 0   diphenhydrAMINE (BENADRYL) 25 MG tablet Take 50 mg by mouth every 6  (six) hours as needed for itching or allergies.      EPINEPHrine (EPIPEN 2-PAK) 0.3 mg/0.3 mL IJ SOAJ injection Inject 0.3 mg into the muscle once as needed (for severe allergic reaction).     meloxicam (MOBIC) 7.5 MG tablet Take 1 tablet by mouth 2 (two) times daily with a meal. (Patient not taking: Reported on 09/03/2023)     omeprazole (PRILOSEC) 40 MG capsule Take 1 capsule (40 mg total) by mouth daily. 30 capsule 1   ondansetron (ZOFRAN) 4 MG tablet Take 1 tablet (4 mg total) by mouth every 8 (eight) hours as needed for nausea or vomiting. 30 tablet 1   polyethylene glycol-electrolytes (NULYTELY) 420 g solution Prepare according to package instructions. Starting at 5:00 PM: Drink one 8 oz glass of mixture every 15 minutes until you finish half of the jug. Five hours prior to procedure, drink 8 oz glass of mixture every 15 minutes until it is all gone. Make sure you do not drink anything 4 hours prior to your procedure. (Patient not taking: Reported on 09/03/2023) 4000 mL 0   predniSONE (DELTASONE) 20 MG tablet Take 2 tablets (40 mg total) by mouth daily with breakfast. (Patient not taking: Reported on 09/03/2023) 14 tablet 0   promethazine-dextromethorphan (PROMETHAZINE-DM) 6.25-15 MG/5ML syrup Take 5 mLs by mouth 4 (four) times daily as needed. (Patient not taking: Reported on 09/03/2023) 118 mL 0   Wheat Dextrin (BENEFIBER DRINK MIX PO) as directed Orally (Patient not taking: Reported on 09/03/2023)     No current facility-administered medications for  this visit.    Allergies as of 09/25/2023 - Review Complete 09/03/2023  Allergen Reaction Noted   Bee venom Anaphylaxis 04/10/2014   Flexeril [cyclobenzaprine] Other (See Comments) 01/17/2014   Lavender oil Anaphylaxis and Other (See Comments) 01/17/2014   Robaxin [methocarbamol] Other (See Comments) 01/17/2014   Shellfish allergy Hives 04/10/2014   Amitriptyline Other (See Comments) 12/22/2011   Entex Other (See Comments) 07/30/2007    Singulair [montelukast sodium] Other (See Comments) 05/13/2015   Etodolac Other (See Comments) 07/30/2007   Iodine Hives 04/10/2014   Mushroom extract complex Hives 04/10/2014    Past Medical History:  Diagnosis Date   Asthma    Exercise induced   Endometriosis    Hyperlipidemia    Interstitial cystitis    Kidney stone    Migraine    Multiple allergies    Stroke (HCC)    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    Past Surgical History:  Procedure Laterality Date   ABDOMINAL HYSTERECTOMY     ABLATION     BIOPSY  08/28/2023   Procedure: BIOPSY;  Surgeon: Wyline Mood, MD;  Location: [facility] ENDOSCOPY;  Service: Gastroenterology;;   CERVICAL BIOPSY  W/ LOOP ELECTRODE EXCISION     CHOLECYSTECTOMY     COLONOSCOPY WITH PROPOFOL N/A 08/28/2023   Procedure: COLONOSCOPY WITH PROPOFOL;  Surgeon: Wyline Mood, MD;  Location: [facility] ENDOSCOPY;  Service: Gastroenterology;  Laterality: N/A;   COLPOSCOPY     CYSTOSTOMY W/ BLADDER BIOPSY     ESOPHAGOGASTRODUODENOSCOPY (EGD) WITH PROPOFOL N/A 08/28/2023   Procedure: ESOPHAGOGASTRODUODENOSCOPY (EGD) WITH PROPOFOL;  Surgeon: Wyline Mood, MD;  Location: [facility] ENDOSCOPY;  Service: Gastroenterology;  Laterality: N/A;   HEMOSTASIS CLIP PLACEMENT  08/28/2023   Procedure: HEMOSTASIS CLIP PLACEMENT;  Surgeon: Wyline Mood, MD;  Location: [facility] ENDOSCOPY;  Service: Gastroenterology;;   KNEE SURGERY     LEEP     POLYPECTOMY  08/28/2023   Procedure: POLYPECTOMY;  Surgeon: Wyline Mood, MD;  Location: [facility] ENDOSCOPY;  Service: Gastroenterology;;   RHINOPLASTY     TENDON REPAIR Left 07/26/2015   Procedure: REPAIR WITH RECONSTRUCTION LEFT THUMB;  Surgeon: Dairl Ponder, MD;  Location: [facility] OR;  Service: Orthopedics;  Laterality: Left;   TONSILLECTOMY     TUBAL LIGATION      Review of Systems:    All systems reviewed and negative except where noted in HPI.   Physical Examination:   LMP 03/03/2007   General: Well-nourished, well-developed in no acute  distress.  Lungs: Clear to auscultation bilaterally. Non-labored. Heart: Regular rate and rhythm, no murmurs rubs or gallops.  Abdomen: Bowel sounds are normal; Abdomen is Soft; No hepatosplenomegaly, masses or hernias;  No Abdominal Tenderness; No guarding or rebound tenderness. Neuro: Alert and oriented x 3.  Grossly intact.  Psych: Alert and cooperative, normal mood and affect.   Imaging Studies: No results found.  Assessment and Plan:   Brandi Morris is a 47 y.o. y/o female 70-month follow-up of nausea, vomiting, diarrhea, abdominal pain.  Stool studies positive for norovirus.  All other infections negative.  EGD showed mild gastritis.  Biopsies negative for H. pylori.  No evidence of celiac.  Colonoscopy showed multiple adenomatous and sessile serrated polyps removed.  Biopsies negative for microscopic colitis.  1.    Celso Amy, PA-C  Follow up ***  BP check ***

## 2023-09-25 ENCOUNTER — Other Ambulatory Visit: Payer: 59

## 2023-09-25 ENCOUNTER — Ambulatory Visit (INDEPENDENT_AMBULATORY_CARE_PROVIDER_SITE_OTHER): Payer: 59

## 2023-09-25 ENCOUNTER — Encounter: Payer: Self-pay | Admitting: Physician Assistant

## 2023-09-25 ENCOUNTER — Encounter (HOSPITAL_BASED_OUTPATIENT_CLINIC_OR_DEPARTMENT_OTHER): Payer: Self-pay | Admitting: Student

## 2023-09-25 ENCOUNTER — Ambulatory Visit (INDEPENDENT_AMBULATORY_CARE_PROVIDER_SITE_OTHER): Payer: 59 | Admitting: Student

## 2023-09-25 ENCOUNTER — Ambulatory Visit (INDEPENDENT_AMBULATORY_CARE_PROVIDER_SITE_OTHER): Payer: 59 | Admitting: Physician Assistant

## 2023-09-25 VITALS — BP 106/68 | HR 91 | Temp 98.1°F | Ht 62.0 in | Wt 168.4 lb

## 2023-09-25 DIAGNOSIS — M25562 Pain in left knee: Secondary | ICD-10-CM | POA: Diagnosis not present

## 2023-09-25 DIAGNOSIS — B338 Other specified viral diseases: Secondary | ICD-10-CM

## 2023-09-25 DIAGNOSIS — M25462 Effusion, left knee: Secondary | ICD-10-CM | POA: Diagnosis not present

## 2023-09-25 DIAGNOSIS — R109 Unspecified abdominal pain: Secondary | ICD-10-CM | POA: Diagnosis not present

## 2023-09-25 DIAGNOSIS — Z860101 Personal history of adenomatous and serrated colon polyps: Secondary | ICD-10-CM

## 2023-09-25 DIAGNOSIS — R197 Diarrhea, unspecified: Secondary | ICD-10-CM

## 2023-09-25 DIAGNOSIS — R112 Nausea with vomiting, unspecified: Secondary | ICD-10-CM | POA: Diagnosis not present

## 2023-09-25 DIAGNOSIS — K2970 Gastritis, unspecified, without bleeding: Secondary | ICD-10-CM | POA: Diagnosis not present

## 2023-09-25 MED ORDER — COLESTIPOL HCL 1 G PO TABS
2.0000 g | ORAL_TABLET | Freq: Every day | ORAL | 5 refills | Status: AC
Start: 2023-09-25 — End: ?

## 2023-09-25 MED ORDER — HYOSCYAMINE SULFATE 0.125 MG PO TABS
0.1250 mg | ORAL_TABLET | ORAL | 5 refills | Status: DC | PRN
Start: 2023-09-25 — End: 2024-07-03

## 2023-09-25 NOTE — Progress Notes (Signed)
Chief Complaint: Left knee pain     History of Present Illness:    Brandi Morris is a 47 y.o. female presenting today with left knee pain.  She did have an injury a few months ago when her knee twisted while falling off of a porch.  It was immediately swollen and painful but this had improved slowly over time.  Her knee then became painful a few days ago without any new injury.  States that she has difficulty straightening her knee and that it is catching occasionally.  Pain is located in the medial and posterior side of the knee and is moderate in severity.  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  Has been taking ibuprofen and elevating the knee.  She does have a previous history of meniscus repair approximately 30 years ago.   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   Past Surgical History:  Procedure Laterality Date   ABDOMINAL HYSTERECTOMY     ABLATION     BIOPSY  08/28/2023   Procedure: BIOPSY;  Surgeon: Wyline Mood, MD;  Location: [facility] ENDOSCOPY;  Service: Gastroenterology;;   CERVICAL BIOPSY  W/ LOOP ELECTRODE EXCISION     CHOLECYSTECTOMY     COLONOSCOPY WITH PROPOFOL N/A 08/28/2023   Procedure: COLONOSCOPY WITH PROPOFOL;  Surgeon: Wyline Mood, MD;  Location: [facility] ENDOSCOPY;  Service: Gastroenterology;  Laterality: N/A;   COLPOSCOPY     CYSTOSTOMY W/ BLADDER BIOPSY     ESOPHAGOGASTRODUODENOSCOPY (EGD) WITH PROPOFOL N/A 08/28/2023   Procedure: ESOPHAGOGASTRODUODENOSCOPY (EGD) WITH PROPOFOL;  Surgeon: Wyline Mood, MD;  Location: [facility] ENDOSCOPY;  Service: Gastroenterology;  Laterality: N/A;    HEMOSTASIS CLIP PLACEMENT  08/28/2023   Procedure: HEMOSTASIS CLIP PLACEMENT;  Surgeon: Wyline Mood, MD;  Location: [facility] ENDOSCOPY;  Service: Gastroenterology;;   KNEE SURGERY     LEEP     POLYPECTOMY  08/28/2023   Procedure: POLYPECTOMY;  Surgeon: Wyline Mood, MD;  Location: [facility] ENDOSCOPY;  Service: Gastroenterology;;   RHINOPLASTY     TENDON REPAIR Left 07/26/2015   Procedure: REPAIR WITH RECONSTRUCTION LEFT THUMB;  Surgeon: Dairl Ponder, MD;  Location: [facility] OR;  Service: Orthopedics;  Laterality: Left;   TONSILLECTOMY     TUBAL LIGATION     Social History   Socioeconomic History   Marital status: Divorced    Spouse name: Not on file   Number of children: 3   Years of education: 15   Highest education level: Not on file  Occupational History   Occupation: Holiday representative  Tobacco Use   Smoking status: Every Day    Current packs/day: 1.00    Average packs/day: 1 pack/day for 25.0 years (25.0 ttl pk-yrs)    Types: Cigarettes   Smokeless tobacco: Never  Vaping Use   Vaping status: Never Used  Substance and Sexual Activity   Alcohol use: Not Currently   Drug use: No  Sexual activity: Yes    Birth control/protection: Surgical  Other Topics Concern   Not on file  Social History Narrative   Fun: Fish and hunt   Denies abuse and feels safe at home.    Social Determinants of Health   Financial Resource Strain: Not on file  Food Insecurity: Not on file  Transportation Needs: No Transportation Needs (06/13/2023)   PRAPARE - Administrator, Civil Service (Medical): No    Lack of Transportation (Non-Medical): No  Physical Activity: Not on file  Stress: Not on file  Social Connections: Not on file   Family History  Problem Relation Age of Onset   Heart disease Mother    COPD Mother    Diabetes Father    Hypertension Father    Stroke Father    Heart disease Father    Breast cancer Maternal Grandmother    Cancer Maternal Grandfather        melanoma    Heart disease Paternal Grandmother    Stroke Paternal Grandmother    Heart attack Paternal Grandmother    Heart disease Paternal Grandfather    Stroke Paternal Grandfather    Heart attack Paternal Grandfather    Allergies  Allergen Reactions   Bee Venom Anaphylaxis   Flexeril [Cyclobenzaprine] Other (See Comments)    Reaction:  Makes pt violent    Lavender Oil Anaphylaxis and Other (See Comments)    Pt states that her lungs and sinuses fill-up with fluid.     Robaxin [Methocarbamol] Other (See Comments)    Reaction:  Makes pt violent    Shellfish Allergy Hives   Amitriptyline Other (See Comments)    Reaction:  Hallucinations    Entex Other (See Comments)    Reaction:  Dizziness    Singulair [Montelukast Sodium] Other (See Comments)    Reaction:  Agitation  Pt states that her sinuses fill-up with fluid.     Etodolac Other (See Comments)    Reaction:  Dizziness    Iodine Hives    CT Contrast   Mushroom Extract Complex Hives   Current Outpatient Medications  Medication Sig Dispense Refill   albuterol (VENTOLIN HFA) 108 (90 Base) MCG/ACT inhaler Inhale 1-2 puffs into the lungs every 6 (six) hours as needed. 18 g 0   bismuth subsalicylate (PEPTO-BISMOL) 262 MG chewable tablet Chew 2 tablets (524 mg total) by mouth in the morning and at bedtime. 60 tablet 0   busPIRone (BUSPAR) 7.5 MG tablet Take 1 tablet (7.5 mg total) by mouth 2 (two) times daily. 60 tablet 0   colestipol (COLESTID) 1 g tablet Take 2 tablets (2 g total) by mouth daily. 60 tablet 5   diphenhydrAMINE (BENADRYL) 25 MG tablet Take 50 mg by mouth every 6 (six) hours as needed for itching or allergies.      EPINEPHrine (EPIPEN 2-PAK) 0.3 mg/0.3 mL IJ SOAJ injection Inject 0.3 mg into the muscle once as needed (for severe allergic reaction).     hyoscyamine (LEVSIN) 0.125 MG tablet Take 1 tablet (0.125 mg total) by mouth every 4 (four) hours as needed. 90 tablet 5   meloxicam (MOBIC) 7.5 MG tablet Take 1 tablet by mouth 2  (two) times daily with a meal.     omeprazole (PRILOSEC) 40 MG capsule Take 1 capsule (40 mg total) by mouth daily. 30 capsule 1   ondansetron (ZOFRAN) 4 MG tablet Take 1 tablet (4 mg total) by mouth every 8 (eight) hours as needed for nausea or vomiting.  30 tablet 1   predniSONE (DELTASONE) 20 MG tablet Take 2 tablets (40 mg total) by mouth daily with breakfast. 14 tablet 0   promethazine-dextromethorphan (PROMETHAZINE-DM) 6.25-15 MG/5ML syrup Take 5 mLs by mouth 4 (four) times daily as needed. 118 mL 0   Wheat Dextrin (BENEFIBER DRINK MIX PO)      No current facility-administered medications for this visit.   No results found.  Review of Systems:   A ROS was performed including pertinent positives and negatives as documented in the HPI.  Physical Exam :   Constitutional: NAD and appears stated age Neurological: Alert and oriented Psych: Appropriate affect and cooperative Last menstrual period 03/03/2007.   Comprehensive Musculoskeletal Exam:    Active range of motion of the left knee is from 5 to 130 degrees.  Mild swelling noted of the knee without erythema or ecchymosis.  Tenderness to palpation over the medial joint line.  No laxity with varus or valgus stress.  Positive McMurray.  Negative Lachman although this does elicit significant pain.  Flexion and extension strength is 5/5.  Imaging:   Xray (left knee 4 views): Negative for acute bony abnormality   I personally reviewed and interpreted the radiographs.   Assessment:   47 y.o. female with left knee pain as a result of an injury 2 months ago.  Pain recently worsened without any known cause.  Based on her exam today as well as mechanical symptoms I do have concern for a meniscus tear.  At this point I would like to obtain an MRI to further assess this.  I would also like to investigate for possible ACL sprain or partial tear as she did have significant pain with Lachman.  Encouraged use of brace for added stability.  Can  take ibuprofen and Tylenol as needed for pain.  Plan :    -Obtain MRI of the left knee and return to clinic for review and treatment discussion     I personally saw and evaluated the patient, and participated in the management and treatment plan.  Hazle Nordmann, PA-C Orthopedics

## 2023-09-26 ENCOUNTER — Encounter (HOSPITAL_BASED_OUTPATIENT_CLINIC_OR_DEPARTMENT_OTHER): Payer: Self-pay

## 2023-09-26 ENCOUNTER — Encounter (HOSPITAL_BASED_OUTPATIENT_CLINIC_OR_DEPARTMENT_OTHER): Payer: Self-pay | Admitting: Student

## 2023-09-26 NOTE — Telephone Encounter (Signed)
done

## 2023-09-30 ENCOUNTER — Ambulatory Visit: Payer: Self-pay

## 2023-09-30 NOTE — Patient Outreach (Signed)
  Care Coordination   Follow Up Visit Note   09/30/2023 Name: Brandi Morris MRN: [id number] DOB: 1976-07-18  Brandi Morris is a 47 y.o. year old female who sees Tysinger, Kermit Balo, PA-C for primary care. I spoke with  Brandi Morris by phone today.  What matters to the patients health and wellness today?  Patient would like to better manage her GI symptoms and receive a diagnosis for her condition.    Goals Addressed             This Visit's Progress    To have weight loss and GI symptoms evaluated and treated   On track    Care Coordination Interventions: Evaluation of current treatment plan related to abnormal weight loss; nausea; impaired bowel elimination with abdominal pain   and patient's adherence to plan as established by provider Determined patient completed her GI visit for follow up on symptoms of abdominal cramping, diarrhea and nausea Review of patient status, including review of consultant's reports, relevant laboratory and other test results, and medications completed Determined patient verbalizes understanding of her prescribed treatment plan     Interventions Today    Flowsheet Row Most Recent Value  Chronic Disease   Chronic disease during today's visit Other  [impaired bowel eliminiation]  General Interventions   General Interventions Discussed/Reviewed General Interventions Discussed, General Interventions Reviewed, Doctor Visits  Doctor Visits Discussed/Reviewed Doctor Visits Reviewed, Doctor Visits Discussed, Specialist  Education Interventions   Education Provided Provided Education  Provided Verbal Education On Medication, Nutrition, When to see the doctor  Nutrition Interventions   Nutrition Discussed/Reviewed Nutrition Discussed, Nutrition Reviewed, Decreasing fats  Pharmacy Interventions   Pharmacy Dicussed/Reviewed Pharmacy Topics Discussed, Pharmacy Topics Reviewed, Medications and their functions          SDOH assessments and  interventions completed:  No     Care Coordination Interventions:  Yes, provided   Follow up plan: Follow up call scheduled for 12/01/23 @2 :30 PM    Encounter Outcome:  Patient Visit Completed

## 2023-09-30 NOTE — Patient Instructions (Signed)
Visit Information  Thank you for taking time to visit with me today. Please don't hesitate to contact me if I can be of assistance to you.   Following are the goals we discussed today:   Goals Addressed             This Visit's Progress    To have weight loss and GI symptoms evaluated and treated   On track    Care Coordination Interventions: Evaluation of current treatment plan related to abnormal weight loss; nausea; impaired bowel elimination with abdominal pain   and patient's adherence to plan as established by provider Determined patient completed her GI visit for follow up on symptoms of abdominal cramping, diarrhea and nausea Review of patient status, including review of consultant's reports, relevant laboratory and other test results, and medications completed Determined patient verbalizes understanding of her prescribed treatment plan         Our next appointment is by telephone on 12/01/23 at 2:30 PM  Please call the care guide team at [phone] if you need to cancel or reschedule your appointment.   If you are experiencing a Mental Health or Behavioral Health Crisis or need someone to talk to, please call 1-800-273-TALK (toll free, 24 hour hotline)  Patient verbalizes understanding of instructions and care plan provided today and agrees to view in MyChart. Active MyChart status and patient understanding of how to access instructions and care plan via MyChart confirmed with patient.     Delsa Sale RN BSN CCM [place]  [facility] Nurse Care Coordinator  Direct Dial: [phone] Website: [email or web address]

## 2023-10-01 ENCOUNTER — Ambulatory Visit
Admission: RE | Admit: 2023-10-01 | Discharge: 2023-10-01 | Disposition: A | Discharge status: Home / Self Care | Payer: 59 | Source: Ambulatory Visit | Attending: Student | Admitting: Student

## 2023-10-01 DIAGNOSIS — M25462 Effusion, left knee: Secondary | ICD-10-CM | POA: Diagnosis not present

## 2023-10-01 DIAGNOSIS — R600 Localized edema: Secondary | ICD-10-CM | POA: Diagnosis not present

## 2023-10-01 DIAGNOSIS — M25562 Pain in left knee: Secondary | ICD-10-CM

## 2023-10-01 DIAGNOSIS — M7122 Synovial cyst of popliteal space [Baker], left knee: Secondary | ICD-10-CM | POA: Diagnosis not present

## 2023-10-01 NOTE — Telephone Encounter (Signed)
Done

## 2023-10-08 ENCOUNTER — Encounter (HOSPITAL_BASED_OUTPATIENT_CLINIC_OR_DEPARTMENT_OTHER): Payer: Self-pay | Admitting: Student

## 2023-10-08 ENCOUNTER — Ambulatory Visit (INDEPENDENT_AMBULATORY_CARE_PROVIDER_SITE_OTHER): Payer: 59 | Admitting: Student

## 2023-10-08 DIAGNOSIS — M25562 Pain in left knee: Secondary | ICD-10-CM

## 2023-10-08 DIAGNOSIS — M6752 Plica syndrome, left knee: Secondary | ICD-10-CM

## 2023-10-08 MED ORDER — TRIAMCINOLONE ACETONIDE 40 MG/ML IJ SUSP
2.0000 mL | INTRAMUSCULAR | Status: AC | PRN
Start: 2023-10-08 — End: 2023-10-08
  Administered 2023-10-08: 2 mL via INTRA_ARTICULAR

## 2023-10-08 MED ORDER — LIDOCAINE HCL 1 % IJ SOLN
4.0000 mL | INTRAMUSCULAR | Status: AC | PRN
Start: 2023-10-08 — End: 2023-10-08
  Administered 2023-10-08: 4 mL

## 2023-10-08 NOTE — Progress Notes (Signed)
Chief Complaint: Left knee pain     History of Present Illness:   10/08/23: Patient is here today for MRI review of the left knee.  She reports minimal change in her pain.  She is currently in the process of moving and is packing/painting her current house in preparation for the move this weekend.  Also states that she has been having to do a lot of physical activity at work such as climbing stairs and moving furniture despite her recent work note with restrictions.   09/25/23: Brandi Morris is a 47 y.o. female presenting today with left knee pain.  She did have an injury a few months ago when her knee twisted while falling off of a porch.  It was immediately swollen and painful but this had improved slowly over time.  Her knee then became painful a few days ago without any new injury.  States that she has difficulty straightening her knee and that it is catching occasionally.  Pain is located in the medial and posterior side of the knee and is moderate in severity.  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  Has been taking ibuprofen and elevating the knee.  She does have a previous history of meniscus repair approximately 30 years ago.   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   Past Surgical History:  Procedure Laterality Date   ABDOMINAL HYSTERECTOMY     ABLATION     BIOPSY  08/28/2023   Procedure: BIOPSY;  Surgeon: Wyline Mood, MD;  Location: [facility] ENDOSCOPY;  Service: Gastroenterology;;   CERVICAL BIOPSY  W/ LOOP ELECTRODE EXCISION     CHOLECYSTECTOMY     COLONOSCOPY WITH PROPOFOL N/A 08/28/2023    Procedure: COLONOSCOPY WITH PROPOFOL;  Surgeon: Wyline Mood, MD;  Location: [facility] ENDOSCOPY;  Service: Gastroenterology;  Laterality: N/A;   COLPOSCOPY     CYSTOSTOMY W/ BLADDER BIOPSY     ESOPHAGOGASTRODUODENOSCOPY (EGD) WITH PROPOFOL N/A 08/28/2023   Procedure: ESOPHAGOGASTRODUODENOSCOPY (EGD) WITH PROPOFOL;  Surgeon: Wyline Mood, MD;  Location: [facility] ENDOSCOPY;  Service: Gastroenterology;  Laterality: N/A;   HEMOSTASIS CLIP PLACEMENT  08/28/2023   Procedure: HEMOSTASIS CLIP PLACEMENT;  Surgeon: Wyline Mood, MD;  Location: [facility] ENDOSCOPY;  Service: Gastroenterology;;   KNEE SURGERY     LEEP     POLYPECTOMY  08/28/2023   Procedure: POLYPECTOMY;  Surgeon: Wyline Mood, MD;  Location: [facility] ENDOSCOPY;  Service: Gastroenterology;;   RHINOPLASTY     TENDON REPAIR Left 07/26/2015   Procedure: REPAIR WITH RECONSTRUCTION LEFT THUMB;  Surgeon: Dairl Ponder, MD;  Location: [facility] OR;  Service: Orthopedics;  Laterality: Left;   TONSILLECTOMY     TUBAL LIGATION     Social History   Socioeconomic History   Marital status: Divorced    Spouse name: Not on file   Number of children: 3   Years of education: 15   Highest education level:  Not on file  Occupational History   Occupation: Holiday representative  Tobacco Use   Smoking status: Every Day    Current packs/day: 1.00    Average packs/day: 1 pack/day for 25.0 years (25.0 ttl pk-yrs)    Types: Cigarettes   Smokeless tobacco: Never  Vaping Use   Vaping status: Never Used  Substance and Sexual Activity   Alcohol use: Not Currently   Drug use: No   Sexual activity: Yes    Birth control/protection: Surgical  Other Topics Concern   Not on file  Social History Narrative   Fun: Fish and hunt   Denies abuse and feels safe at home.    Social Determinants of Health   Financial Resource Strain: Not on file  Food Insecurity: Not on file  Transportation Needs: No Transportation Needs (06/13/2023)   PRAPARE - Scientist, research (physical sciences) (Medical): No    Lack of Transportation (Non-Medical): No  Physical Activity: Not on file  Stress: Not on file  Social Connections: Not on file   Family History  Problem Relation Age of Onset   Heart disease Mother    COPD Mother    Diabetes Father    Hypertension Father    Stroke Father    Heart disease Father    Breast cancer Maternal Grandmother    Cancer Maternal Grandfather        melanoma   Heart disease Paternal Grandmother    Stroke Paternal Grandmother    Heart attack Paternal Grandmother    Heart disease Paternal Grandfather    Stroke Paternal Grandfather    Heart attack Paternal Grandfather    Allergies  Allergen Reactions   Bee Venom Anaphylaxis   Flexeril [Cyclobenzaprine] Other (See Comments)    Reaction:  Makes pt violent    Lavender Oil Anaphylaxis and Other (See Comments)    Pt states that her lungs and sinuses fill-up with fluid.     Robaxin [Methocarbamol] Other (See Comments)    Reaction:  Makes pt violent    Shellfish Allergy Hives   Amitriptyline Other (See Comments)    Reaction:  Hallucinations    Entex Other (See Comments)    Reaction:  Dizziness    Singulair [Montelukast Sodium] Other (See Comments)    Reaction:  Agitation  Pt states that her sinuses fill-up with fluid.     Etodolac Other (See Comments)    Reaction:  Dizziness    Iodine Hives    CT Contrast   Mushroom Extract Complex Hives   Current Outpatient Medications  Medication Sig Dispense Refill   albuterol (VENTOLIN HFA) 108 (90 Base) MCG/ACT inhaler Inhale 1-2 puffs into the lungs every 6 (six) hours as needed. 18 g 0   bismuth subsalicylate (PEPTO-BISMOL) 262 MG chewable tablet Chew 2 tablets (524 mg total) by mouth in the morning and at bedtime. 60 tablet 0   busPIRone (BUSPAR) 7.5 MG tablet Take 1 tablet (7.5 mg total) by mouth 2 (two) times daily. 60 tablet 0   colestipol (COLESTID) 1 g tablet Take 2 tablets (2 g total) by mouth daily. 60 tablet 5    diphenhydrAMINE (BENADRYL) 25 MG tablet Take 50 mg by mouth every 6 (six) hours as needed for itching or allergies.      EPINEPHrine (EPIPEN 2-PAK) 0.3 mg/0.3 mL IJ SOAJ injection Inject 0.3 mg into the muscle once as needed (for severe allergic reaction).     hyoscyamine (LEVSIN) 0.125 MG tablet Take 1 tablet (0.125 mg total)  by mouth every 4 (four) hours as needed. 90 tablet 5   meloxicam (MOBIC) 7.5 MG tablet Take 1 tablet by mouth 2 (two) times daily with a meal.     omeprazole (PRILOSEC) 40 MG capsule Take 1 capsule (40 mg total) by mouth daily. 30 capsule 1   ondansetron (ZOFRAN) 4 MG tablet Take 1 tablet (4 mg total) by mouth every 8 (eight) hours as needed for nausea or vomiting. 30 tablet 1   predniSONE (DELTASONE) 20 MG tablet Take 2 tablets (40 mg total) by mouth daily with breakfast. 14 tablet 0   promethazine-dextromethorphan (PROMETHAZINE-DM) 6.25-15 MG/5ML syrup Take 5 mLs by mouth 4 (four) times daily as needed. 118 mL 0   Wheat Dextrin (BENEFIBER DRINK MIX PO)      No current facility-administered medications for this visit.   No results found.  Review of Systems:   A ROS was performed including pertinent positives and negatives as documented in the HPI.  Physical Exam :   Constitutional: NAD and appears stated age Neurological: Alert and oriented Psych: Appropriate affect and cooperative Last menstrual period 03/03/2007.   Comprehensive Musculoskeletal Exam:    Active range of motion of the left knee is from 5 to 130 degrees.  Mild swelling noted of the knee without erythema or ecchymosis.  Tenderness to palpation over the medial joint line.  No laxity with varus or valgus stress.  Imaging:   MRI (left knee): No evidence of ligamentous injury or meniscal tear.  Presence of medial plica noted.   I personally reviewed and interpreted the radiographs.   Assessment:   47 y.o. female with persistent left knee pain and mechanical symptoms after an injury 2 months ago.   Review of left knee MRI today shows no evidence of a ligamentous or meniscal injury however there is a medial plica which I believe could be symptomatic.  I have offered a cortisone injection today for symptomatic relief which patient is agreeable to.  Injection was performed of the left knee without any complication and she tolerated this well.  Will plan to assess relief and patient understands to return for reevaluation should injection fail to improve her symptoms or if pain recurs.  Plan :    -Cortisone injection performed of the left knee today -Return to clinic as needed    Procedure Note  Patient: Brandi Morris             Date of Birth: [birth year]           MRN: [id number]             Visit Date: 10/08/2023  Procedures: Visit Diagnoses:  1. Plica syndrome, left knee     Large Joint Inj: L knee on 10/08/2023 5:27 PM Indications: pain Details: 22 G 1.5 in needle, anterolateral approach Medications: 4 mL lidocaine 1 %; 2 mL triamcinolone acetonide 40 MG/ML Outcome: tolerated well, no immediate complications Procedure, treatment alternatives, risks and benefits explained, specific risks discussed. Consent was given by the patient. Immediately prior to procedure a time out was called to verify the correct patient, procedure, equipment, support staff and site/side marked as required. Patient was prepped and draped in the usual sterile fashion.      I personally saw and evaluated the patient, and participated in the management and treatment plan.  Hazle Nordmann, PA-C Orthopedics

## 2023-10-19 DIAGNOSIS — Z419 Encounter for procedure for purposes other than remedying health state, unspecified: Secondary | ICD-10-CM | POA: Diagnosis not present

## 2023-10-24 ENCOUNTER — Other Ambulatory Visit (HOSPITAL_BASED_OUTPATIENT_CLINIC_OR_DEPARTMENT_OTHER): Payer: Self-pay | Admitting: Student

## 2023-10-24 MED ORDER — TRAMADOL HCL 50 MG PO TABS
50.0000 mg | ORAL_TABLET | Freq: Four times a day (QID) | ORAL | 0 refills | Status: AC | PRN
Start: 2023-10-24 — End: 2023-10-29

## 2023-11-05 ENCOUNTER — Encounter: Payer: Self-pay | Admitting: Gastroenterology

## 2023-11-19 DIAGNOSIS — Z419 Encounter for procedure for purposes other than remedying health state, unspecified: Secondary | ICD-10-CM | POA: Diagnosis not present

## 2023-11-24 ENCOUNTER — Other Ambulatory Visit: Payer: Self-pay

## 2023-11-24 NOTE — Progress Notes (Deleted)
 Ellouise Console, PA-C [address]  [address]  [place], [place] [address]  Main: [phone]  Fax: [phone]   Primary Care Physician: Bulah Alm RAMAN, PA-C  Primary Gastroenterologist:  Ellouise Console, PA-C / Dr. Ruel Kung    CC: F/U Diarrhea, Nausea, Abdominal Pain  HPI: Brandi Morris is a 48 y.o. female returns for 45-month follow-up of episodic diarrhea, nausea, abdominal pain.  08/28/2023 EGD by Dr. Kung: Mild gastritis, otherwise normal.  Biopsies negative for H. pylori.  08/28/2023 colonoscopy: 6 small (5 mm to 8 mm) polyps (tubular adenoma x 2, sessile serrated polyps x 3, hyperplastic polyp x 1 removed.  Good prep.  Diverticulosis.  3-year repeat.  07/2023 stool studies: Positive norovirus.  GI pathogen panel negative for all other infections.  C. difficile negative.  Normal fecal calprotectin.  Labs 04/23/2023 showed normal CBC, CMP, TSH, and lipase.     CT chest abdomen and pelvis with contrast 04/2023 showed mild emphysema, right adrenal adenoma benign, left nephrolithiasis, sigmoid diverticulosis but no diverticulitis, previous cholecystectomy and hysterectomy.  She is taking Colestid  1 g twice daily to help diarrhea.  Hyoscyamine  as needed for abdominal cramping.  Omeprazole  40 Mg once daily for acid reflux.  Zofran  as needed nausea.  Benefiber once daily and Pepto-Bismol as needed.  Previous cholecystectomy.  Current symptoms  Current Outpatient Medications  Medication Sig Dispense Refill   albuterol  (VENTOLIN  HFA) 108 (90 Base) MCG/ACT inhaler Inhale 1-2 puffs into the lungs every 6 (six) hours as needed. 18 g 0   bismuth  subsalicylate (PEPTO-BISMOL) 262 MG chewable tablet Chew 2 tablets (524 mg total) by mouth in the morning and at bedtime. 60 tablet 0   colestipol  (COLESTID ) 1 g tablet Take 2 tablets (2 g total) by mouth daily. 60 tablet 5   diphenhydrAMINE  (BENADRYL ) 25 MG tablet Take 50 mg by mouth every 6 (six) hours as needed for itching or allergies.       EPINEPHrine  (EPIPEN  2-PAK) 0.3 mg/0.3 mL IJ SOAJ injection Inject 0.3 mg into the muscle once as needed (for severe allergic reaction).     hyoscyamine  (LEVSIN ) 0.125 MG tablet Take 1 tablet (0.125 mg total) by mouth every 4 (four) hours as needed. 90 tablet 5   meloxicam  (MOBIC ) 7.5 MG tablet Take 1 tablet by mouth 2 (two) times daily with a meal.     omeprazole  (PRILOSEC) 40 MG capsule Take 1 capsule (40 mg total) by mouth daily. 30 capsule 1   ondansetron  (ZOFRAN ) 4 MG tablet Take 1 tablet (4 mg total) by mouth every 8 (eight) hours as needed for nausea or vomiting. 30 tablet 1   Wheat Dextrin (BENEFIBER DRINK MIX PO)      No current facility-administered medications for this visit.    Allergies as of 11/25/2023 - Review Complete 10/08/2023  Allergen Reaction Noted   Bee venom Anaphylaxis 04/10/2014   Flexeril [cyclobenzaprine] Other (See Comments) 01/17/2014   Lavender oil Anaphylaxis and Other (See Comments) 01/17/2014   Robaxin [methocarbamol] Other (See Comments) 01/17/2014   Shellfish allergy Hives 04/10/2014   Amitriptyline Other (See Comments) 12/22/2011   Entex Other (See Comments) 07/30/2007   Singulair  [montelukast  sodium] Other (See Comments) 05/13/2015   Etodolac Other (See Comments) 07/30/2007   Iodine Hives 04/10/2014   Mushroom extract complex (do not select) Hives 04/10/2014    Past Medical History:  Diagnosis Date   Asthma    Exercise induced   Endometriosis    Hyperlipidemia    Interstitial cystitis  Kidney stone    Migraine    Multiple allergies    Stroke [facility])    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    Past Surgical History:  Procedure Laterality Date   ABDOMINAL HYSTERECTOMY     ABLATION     BIOPSY  08/28/2023   Procedure: BIOPSY;  Surgeon: Therisa Bi, MD;  Location: [facility] ENDOSCOPY;  Service: Gastroenterology;;   CERVICAL BIOPSY  W/ LOOP ELECTRODE EXCISION     CHOLECYSTECTOMY     COLONOSCOPY WITH PROPOFOL  N/A 08/28/2023   Procedure:  COLONOSCOPY WITH PROPOFOL ;  Surgeon: Therisa Bi, MD;  Location[facility] END[facility]COPY;  Service: Gastroenterology;  Laterality: N/A;   COLPOSCOPY     CYSTOSTOMY W/ BLADDER BIOPSY     ESOPHAGOGASTRODUODENOSCOPY (EGD) WITH PROPOFOL  N/A 08/28/2023   Procedure: ESOPHAGOGASTRODUODENOSCOPY (EGD) WITH PROPOFOL ;  Surgeon: Therisa Bi, MD;  Location: [facility] ENDOSCOPY;  Service: Gastroenterology;  Laterality: N/A;   HEMOSTASIS CLIP PLACEMENT  08/28/2023   Procedure: HEMOSTASIS CLIP PLACEMENT;  Surgeon: Therisa Bi, MD;  Location: [facility] ENDOSCOPY;  Service: Gastroenterology;;   KNEE SURGERY     LEEP     POLYPECTOMY  08/28/2023   Procedure: POLYPECTOMY;  Surgeon: Therisa Bi, MD;  Location: [facility] ENDOSCOPY;  Service: Gastroenterology;;   RHINOPLASTY     TENDON REPAIR Left 07/26/2015   Procedure: REPAIR WITH RECONSTRUCTION LEFT THUMB;  Surgeon: Donnice Robinsons, MD;  Location: [facility] OR;  Service: Orthopedics;  Laterality: Left;   TONSILLECTOMY     TUBAL LIGATION      Review of Systems:    All systems reviewed and negative except where noted in HPI.   Physical Examination:   LMP 03/03/2007   General: Well-nourished, well-developed in no acute distress.  Lungs: Clear to auscultation bilaterally. Non-labored. Heart: Regular rate and rhythm, no murmurs rubs or gallops.  Abdomen: Bowel sounds are normal; Abdomen is Soft; No hepatosplenomegaly, masses or hernias;  No Abdominal Tenderness; No guarding or rebound tenderness. Neuro: Alert and oriented x 3.  Grossly intact.  Psych: Alert and cooperative, normal mood and affect.   Imaging Studies: No results found.  Assessment and Plan:   Brandi Morris is a 48 y.o. y/o female returns for follow-up of:  1.  Mild gastritis; H. pylori negative  Continue omeprazole  40 Mg daily  Avoid NSAIDs and GERD trigger foods  2.  Chronic diarrhea; bile salt diarrhea postcholecystectomy versus IBS-D.  Recent colonoscopy showed no evidence of IBD or microscopic colitis.   Stool studies positive for norovirus, all other pathogens negative.  Continue Colestid  1 g twice daily  Continue Benefiber or FiberCon  Probiotic, low FODMAP, Imodium?  3.  Adenomatous and sessile serrated colon polyps  3 year repeat Colonoscopy due 08/2026    Ellouise Console, PA-C  Follow up ***  BP check ***

## 2023-11-25 ENCOUNTER — Ambulatory Visit: Payer: 59 | Admitting: Physician Assistant

## 2023-11-26 ENCOUNTER — Ambulatory Visit (INDEPENDENT_AMBULATORY_CARE_PROVIDER_SITE_OTHER): Payer: 59 | Admitting: Orthopaedic Surgery

## 2023-11-26 ENCOUNTER — Other Ambulatory Visit (HOSPITAL_BASED_OUTPATIENT_CLINIC_OR_DEPARTMENT_OTHER): Payer: Self-pay

## 2023-11-26 DIAGNOSIS — M6752 Plica syndrome, left knee: Secondary | ICD-10-CM

## 2023-11-26 MED ORDER — ACETAMINOPHEN 500 MG PO TABS
500.0000 mg | ORAL_TABLET | Freq: Three times a day (TID) | ORAL | 0 refills | Status: AC
  Filled 2023-11-26: qty 30, 10d supply, fill #0

## 2023-11-26 MED ORDER — IBUPROFEN 800 MG PO TABS
800.0000 mg | ORAL_TABLET | Freq: Three times a day (TID) | ORAL | 0 refills | Status: AC
  Filled 2023-11-26: qty 30, 10d supply, fill #0

## 2023-11-26 MED ORDER — OXYCODONE HCL 5 MG PO TABS
5.0000 mg | ORAL_TABLET | ORAL | 0 refills | Status: DC | PRN
Start: 2023-11-26 — End: 2024-04-06
  Filled 2023-11-26: qty 10, 2d supply, fill #0

## 2023-11-26 MED ORDER — ASPIRIN 325 MG PO TBEC
325.0000 mg | DELAYED_RELEASE_TABLET | Freq: Every day | ORAL | 0 refills | Status: DC
  Filled 2023-11-26: qty 14, 14d supply, fill #0

## 2023-11-26 NOTE — Progress Notes (Signed)
 Chief Complaint: Left knee pain        History of Present Illness:   11/26/2023: Presents today for further over the left knee.  At this time she has not gotten any type of significant relief from her injection   10/08/23: Patient is here today for MRI review of the left knee.  She reports minimal change in her pain.  She is currently in the process of moving and is packing/painting her current house in preparation for the move this weekend.  Also states that she has been having to do a lot of physical activity at work such as climbing stairs and moving furniture despite her recent work note with restrictions.   09/25/23: Brandi Morris is a 48 y.o. female presenting today with left knee pain.  She did have an injury a few months ago when her knee twisted while falling off of a porch.  It was immediately swollen and painful but this had improved slowly over time.  Her knee then became painful a few days ago without any new injury.  States that she has difficulty straightening her knee and that it is catching occasionally.  Pain is located in the medial and posterior side of the knee and is moderate in severity.  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  Has been taking ibuprofen  and elevating the knee.  She does have a previous history of meniscus repair approximately 30 years ago.     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             Past Surgical History:  Procedure Laterality Date   ABDOMINAL HYSTERECTOMY       ABLATION       BIOPSY   08/28/2023    Procedure: BIOPSY;  Surgeon: Therisa Bi, MD;  Location:  [facility] ENDOSCOPY;  Service: Gastroenterology;;   CERVICAL BIOPSY  W/ LOOP ELECTRODE EXCISION       CHOLECYSTECTOMY       COLONOSCOPY WITH PROPOFOL  N/A 08/28/2023    Procedure: COLONOSCOPY WITH PROPOFOL ;  Surgeon: Therisa Bi, MD;  Location: [facility] ENDOSCOPY;  Service: Gastroenterology;  Laterality: N/A;   COLPOSCOPY       CYSTOSTOMY W/ BLADDER BIOPSY       ESOPHAGOGASTRODUODENOSCOPY (EGD) WITH PROPOFOL  N/A 08/28/2023    Procedure: ESOPHAGOGASTRODUODENOSCOPY (EGD) WITH PROPOFOL ;  Surgeon: Therisa Bi, MD;  Location: [facility] ENDOSCOPY;  Service: Gastroenterology;  Laterality: N/A;   HEMOSTASIS CLIP PLACEMENT   08/28/2023    Procedure: HEMOSTASIS CLIP PLACEMENT;  Surgeon: Therisa Bi, MD;  Location: [facility] ENDOSCOPY;  Service: Gastroenterology;;   KNEE SURGERY       LEEP       POLYPECTOMY   08/28/2023    Procedure: POLYPECTOMY;  Surgeon: Therisa Bi, MD;  Location: [facility] ENDOSCOPY;  Service: Gastroenterology;;   RHINOPLASTY       TENDON REPAIR Left 07/26/2015    Procedure: REPAIR WITH RECONSTRUCTION LEFT THUMB;  Surgeon: Donnice Robinsons, MD;  Location: [facility] OR;  Service: Orthopedics;  Laterality: Left;   TONSILLECTOMY       TUBAL LIGATION            Social History         Socioeconomic History   Marital status: Divorced      Spouse name: Not on file   Number of children: 3   Years of education: 15   Highest education level: Not on file  Occupational History   Occupation: Holiday Representative  Tobacco Use   Smoking status: Every Day      Current packs/day: 1.00      Average packs/day: 1 pack/day for 25.0 years (25.0 ttl pk-yrs)      Types: Cigarettes   Smokeless tobacco: Never  Vaping Use   Vaping status: Never Used  Substance and Sexual Activity   Alcohol use: Not Currently   Drug use: No   Sexual activity: Yes      Birth control/protection: Surgical  Other Topics Concern   Not on file  Social History Narrative    Fun: Fish and hunt    Denies abuse and feels safe at home.      Social Determinants of Health        Financial Resource Strain: Not on file  Food Insecurity: Not on file  Transportation Needs: No Transportation Needs (06/13/2023)    PRAPARE - Therapist, Art (Medical): No     Lack of Transportation (Non-Medical): No  Physical Activity: Not on file  Stress: Not on file  Social Connections: Not on file         Family History  Problem Relation Age of Onset   Heart disease Mother     COPD Mother     Diabetes Father     Hypertension Father     Stroke Father     Heart disease Father     Breast cancer Maternal Grandmother     Cancer Maternal Grandfather          melanoma   Heart disease Paternal Grandmother     Stroke Paternal Grandmother     Heart attack Paternal Grandmother     Heart disease Paternal Grandfather     Stroke Paternal Grandfather     Heart attack Paternal Grandfather          Allergies       Allergies  Allergen Reactions   Bee Venom Anaphylaxis   Flexeril [Cyclobenzaprine] Other (See Comments)      Reaction:  Makes pt violent    Lavender Oil Anaphylaxis and Other (See Comments)      Pt states that her lungs and sinuses fill-up with fluid.     Robaxin [Methocarbamol] Other (See Comments)      Reaction:  Makes pt violent    Shellfish Allergy Hives   Amitriptyline Other (See Comments)      Reaction:  Hallucinations    Entex Other (See Comments)      Reaction:  Dizziness    Singulair  [Montelukast  Sodium] Other (See Comments)      Reaction:  Agitation  Pt states that her sinuses fill-up with fluid.     Etodolac Other (See Comments)      Reaction:  Dizziness    Iodine Hives      CT Contrast   Mushroom Extract Complex Hives            Current Outpatient Medications  Medication Sig Dispense Refill   albuterol  (VENTOLIN  HFA)  108 (90 Base) MCG/ACT inhaler Inhale 1-2 puffs into the lungs every 6 (six) hours as needed. 18 g 0   bismuth  subsalicylate (PEPTO-BISMOL) 262 MG chewable tablet Chew  2 tablets (524 mg total) by mouth in the morning and at bedtime. 60 tablet 0   busPIRone  (BUSPAR ) 7.5 MG tablet Take 1 tablet (7.5 mg total) by mouth 2 (two) times daily. 60 tablet 0   colestipol  (COLESTID ) 1 g tablet Take 2 tablets (2 g total) by mouth daily. 60 tablet 5   diphenhydrAMINE  (BENADRYL ) 25 MG tablet Take 50 mg by mouth every 6 (six) hours as needed for itching or allergies.        EPINEPHrine  (EPIPEN  2-PAK) 0.3 mg/0.3 mL IJ SOAJ injection Inject 0.3 mg into the muscle once as needed (for severe allergic reaction).       hyoscyamine  (LEVSIN ) 0.125 MG tablet Take 1 tablet (0.125 mg total) by mouth every 4 (four) hours as needed. 90 tablet 5   meloxicam  (MOBIC ) 7.5 MG tablet Take 1 tablet by mouth 2 (two) times daily with a meal.       omeprazole  (PRILOSEC) 40 MG capsule Take 1 capsule (40 mg total) by mouth daily. 30 capsule 1   ondansetron  (ZOFRAN ) 4 MG tablet Take 1 tablet (4 mg total) by mouth every 8 (eight) hours as needed for nausea or vomiting. 30 tablet 1   predniSONE  (DELTASONE ) 20 MG tablet Take 2 tablets (40 mg total) by mouth daily with breakfast. 14 tablet 0   promethazine -dextromethorphan (PROMETHAZINE -DM) 6.25-15 MG/5ML syrup Take 5 mLs by mouth 4 (four) times daily as needed. 118 mL 0   Wheat Dextrin (BENEFIBER DRINK MIX PO)            No current facility-administered medications for this visit.      Imaging Results (Last 48 hours)  No results found.     Review of Systems:   A ROS was performed including pertinent positives and negatives as documented in the HPI.   Physical Exam :   Constitutional: NAD and appears stated age Neurological: Alert and oriented Psych: Appropriate affect and cooperative Last menstrual period 03/03/2007.    Comprehensive Musculoskeletal Exam:     Active range of motion of the left knee is from 5 to 130 degrees.  Mild swelling noted of the knee without erythema or ecchymosis.  Tenderness to palpation over the medial joint line.   No laxity with varus or valgus stress.   Imaging:   MRI (left knee): No evidence of ligamentous injury or meniscal tear.  Presence of medial plica noted.     I personally reviewed and interpreted the radiographs.     Assessment:   48 y.o. female with persistent left knee pain and mechanical symptoms after an injury 2 months ago.  I discussed that there is 1-2 slices on an MRI of the knee that might hint at an undersurface medial meniscal tear.  That being said the only way to make a definitive diagnosis would be with a diagnostic arthroscopy.  I did describe that I would recommend a plica excision as well at the time of that.  Given the fact that she is still having persistent mechanical symptoms without any relief from an injection I did discuss the risks and limitations as well as recovery timeframe following the meniscal repair.  After discussion of her options she is ultimately elected for a left knee diagnostic arthroscopy with possible medial meniscal repair.   Plan :     -  Plan for left knee diagnostic arthroscopy with possible medial meniscal repair   After a lengthy discussion of treatment options, including risks, benefits, alternatives, complications of surgical and nonsurgical conservative options, the patient elected surgical repair.   The patient  is aware of the material risks  and complications including, but not limited to injury to adjacent structures, neurovascular injury, infection, numbness, bleeding, implant failure, thermal burns, stiffness, persistent pain, failure to heal, disease transmission from allograft, need for further surgery, dislocation, anesthetic risks, blood clots, risks of death,and others. The probabilities of surgical success and failure discussed with patient given their particular co-morbidities.The time and nature of expected rehabilitation and recovery was discussed.The patient's questions were all answered preoperatively.  No barriers to understanding  were noted. I explained the natural history of the disease process and Rx rationale.  I explained to the patient what I considered to be reasonable expectations given their personal situation.  The final treatment plan was arrived at through a shared patient decision making process model.

## 2023-12-01 ENCOUNTER — Ambulatory Visit: Payer: Self-pay

## 2023-12-01 NOTE — Patient Outreach (Signed)
  Care Coordination   12/01/2023 Name: Brandi Morris MRN: [id number] DOB: [birth year]   Care Coordination Outreach Attempts:  An unsuccessful outreach was attempted for an appointment today.  Follow Up Plan:  Additional outreach attempts will be made to offer the patient complex care management information and services.   Encounter Outcome:  Patient Request to Call Back   Care Coordination Interventions:  No, not indicated    Clayborne Ly RN BSN CCM [place]  [facility] Nurse Care Coordinator  Direct Dial: [phone] Website: [email or web address]

## 2023-12-12 ENCOUNTER — Ambulatory Visit: Payer: Self-pay

## 2023-12-12 NOTE — Patient Outreach (Signed)
Care Coordination   Follow Up Visit Note   12/12/2023 Name: Brandi Morris MRN: [id number] DOB: 08-29-1976  Brandi Morris is a 48 y.o. year old female who sees Tysinger, Kermit Balo, PA-C for primary care. I spoke with  Brandi Morris by phone today.  What matters to the patients health and wellness today?  Patient would like to make a full recovery from her upcoming left her knee surgery.     Goals Addressed             This Visit's Progress    COMPLETED: To have weight loss and GI symptoms evaluated and treated       Care Coordination Interventions: Evaluation of current treatment plan related to abnormal weight loss; nausea; impaired bowel elimination with abdominal pain   and patient's adherence to plan as established by provider Determined patient feels her GI symptoms have resolved since following the medication protocol she was prescribed;  Colestipol HCL 2 g oral daily  Hyoscyamine Sulfate 0.125 mg oral every 4 hours PRN Review of patient status, including review of consultant's reports, relevant laboratory and other test results, and medications completed 07/21/2023: GI pathogen panel positive for norovirus, all other infections negative.  C. difficile toxin negative.  Fecal calprotectin normal.   EGD 08/28/2023 by Dr. Tobi Bastos showed mild gastritis, otherwise normal.  Biopsies negative for H. pylori. Colonoscopy 08/28/2023 showed 2 small (5 mm to 6 mm) rectal polyps removed.  4 small (5 mm to 8 mm) polyps removed from descending colon.  Good prep.  Left colon diverticulosis.  Biopsies negative for microscopic colitis.  Pathology of polyps were tubular adenomas, sessile serrated polyps, and 1 hyperplastic polyp.  No dysplasia.  3-year repeat colonoscopy. Labs 04/23/2023 showed normal CBC, CMP, TSH, and lipase.    CT chest abdomen and pelvis with contrast 04/2023 showed mild emphysema, right adrenal adenoma benign, left nephrolithiasis, sigmoid diverticulosis but no diverticulitis,  previous cholecystectomy and hysterectomy. 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 Steward Drone with [facility] at [facility] location Reviewed and discussed patient is scheduled for her post operative follow up on 01/15/24 Discussed with patient she understands what to expect with this procedure and plans to take time off from work as deemed necessary by her doctor  Discussed plans with patient for ongoing care coordination follow up and provided patient with direct contact information for nurse care coordinator Routed note to PCP     Interventions Today    Flowsheet Row Most Recent Value  Chronic Disease   Chronic disease during today's visit Other  [left knee pain,  IBS]  General Interventions   General Interventions Discussed/Reviewed General Interventions Discussed, General Interventions Reviewed, Doctor Visits, Communication with  Doctor Visits Discussed/Reviewed Doctor Visits Discussed, Doctor Visits Reviewed, Specialist  Communication with PCP/Specialists  Onalee Hua Tysinger PA-C]  Exercise Interventions   Exercise Discussed/Reviewed Physical Activity  Physical Activity Discussed/Reviewed Physical Activity Reviewed, Physical Activity Discussed  Education Interventions  Education Provided Provided Education  Provided Verbal Education On Exercise, When to see the doctor, Nutrition, Medication  Nutrition Interventions    Nutrition Discussed/Reviewed Nutrition Reviewed, Nutrition Discussed  Pharmacy Interventions   Pharmacy Dicussed/Reviewed Pharmacy Topics Discussed, Pharmacy Topics Reviewed, Medications and their functions          SDOH assessments and interventions completed:  No     Care Coordination Interventions:  Yes, provided   Follow up plan: Follow up call scheduled for 01/16/24 @10 :30 AM    Encounter Outcome:  Patient Visit Completed

## 2023-12-12 NOTE — Patient Instructions (Signed)
Visit Information  Thank you for taking time to visit with me today. Please don't hesitate to contact me if I can be of assistance to you.   Following are the goals we discussed today:   Goals Addressed             This Visit's Progress    COMPLETED: To have weight loss and GI symptoms evaluated and treated       Care Coordination Interventions: Evaluation of current treatment plan related to abnormal weight loss; nausea; impaired bowel elimination with abdominal pain   and patient's adherence to plan as established by provider Determined patient feels her GI symptoms have resolved since following the medication protocol she was prescribed;  Colestipol HCL 2 g oral daily  Hyoscyamine Sulfate 0.125 mg oral every 4 hours PRN Review of patient status, including review of consultant's reports, relevant laboratory and other test results, and medications completed 07/21/2023: GI pathogen panel positive for norovirus, all other infections negative.  C. difficile toxin negative.  Fecal calprotectin normal.   EGD 08/28/2023 by Dr. Tobi Bastos showed mild gastritis, otherwise normal.  Biopsies negative for H. pylori. Colonoscopy 08/28/2023 showed 2 small (5 mm to 6 mm) rectal polyps removed.  4 small (5 mm to 8 mm) polyps removed from descending colon.  Good prep.  Left colon diverticulosis.  Biopsies negative for microscopic colitis.  Pathology of polyps were tubular adenomas, sessile serrated polyps, and 1 hyperplastic polyp.  No dysplasia.  3-year repeat colonoscopy. Labs 04/23/2023 showed normal CBC, CMP, TSH, and lipase.    CT chest abdomen and pelvis with contrast 04/2023 showed mild emphysema, right adrenal adenoma benign, left nephrolithiasis, sigmoid diverticulosis but no diverticulitis, previous cholecystectomy and hysterectomy. Determined patient verbalizes understanding of her prescribed treatment plan  Discussed patient is eating better and tolerating her food choices, she is unable to eat fast food  or symptoms reoccur, patient is happy that she has gained approximately 3-4 lbs Instructed patient to notify her doctor of new or worsening symptoms promptly      To recover from knee surgery without complications       Care Coordination Interventions: Reviewed provider established plan for pain management Determined patient will undergo left knee arthroscopy for Plica Syndrome and drainage of a Bakers Cyst o 01/01/24 per Dr. Steward Drone with [facility] at [facility] location Reviewed and discussed patient is scheduled for her post operative follow up on 01/15/24 Discussed with patient she understands what to expect with this procedure and plans to take time off from work as deemed necessary by her doctor  Discussed plans with patient for ongoing care coordination follow up and provided patient with direct contact information for nurse care coordinator Routed note to PCP           Our next appointment is by telephone on 01/16/24 at 10:30 AM  Please call the care guide team at [phone] if you need to cancel or reschedule your appointment.   If you are experiencing a Mental Health or Behavioral Health Crisis or need someone to talk to, please call 1-800-273-TALK (toll free, 24 hour hotline)  Patient verbalizes understanding of instructions and care plan provided today and agrees to view in MyChart. Active MyChart status and patient understanding of how to access instructions and care plan via MyChart confirmed with patient.     Delsa Sale RN BSN CCM [place]  [facility], [facility] Nurse Care Coordinator  Direct Dial: [phone] Website: [email or web address]

## 2023-12-20 DIAGNOSIS — Z419 Encounter for procedure for purposes other than remedying health state, unspecified: Secondary | ICD-10-CM | POA: Diagnosis not present

## 2024-01-01 ENCOUNTER — Encounter (HOSPITAL_BASED_OUTPATIENT_CLINIC_OR_DEPARTMENT_OTHER): Payer: Self-pay | Admitting: Orthopaedic Surgery

## 2024-01-01 DIAGNOSIS — S83242A Other tear of medial meniscus, current injury, left knee, initial encounter: Secondary | ICD-10-CM | POA: Diagnosis not present

## 2024-01-01 DIAGNOSIS — M6752 Plica syndrome, left knee: Secondary | ICD-10-CM | POA: Diagnosis not present

## 2024-01-01 DIAGNOSIS — M23332 Other meniscus derangements, other medial meniscus, left knee: Secondary | ICD-10-CM

## 2024-01-01 NOTE — Progress Notes (Signed)
   Date of Surgery: 01/01/2024  INDICATIONS: [name] is a 48 y.o.-year-old female with left knee medial meniscal tear.  The risk and benefits of the procedure were discussed in detail and documented in the pre-operative evaluation.   PREOPERATIVE DIAGNOSIS: 1. Left Knee medial meniscal tear  POSTOPERATIVE DIAGNOSIS: Same.  PROCEDURE: 1. Left knee medial meniscal repair  SURGEON: Benancio Deeds MD  ASSISTANT: Ardeen Fillers, ATC  ANESTHESIA:  general  IV FLUIDS AND URINE: See anesthesia record.  ANTIBIOTICS: Ancef  ESTIMATED BLOOD LOSS: 5 mL.  IMPLANTS:  2 times FasT-Fix all inside implants  DRAINS: None  CULTURES: None  COMPLICATIONS: none  DESCRIPTION OF PROCEDURE:  Examination under anesthesia: A careful examination under anesthesia was performed.  Knee ROM motion was: -3 to 135 Lachman: Normal Pivot Shift: Normal Posterior drawer: normal.   Varus stability in full extension: normal.   Varus stability in 30 degrees of flexion: normal.  Valgus stability in full extension: normal.   Valgus stability in 30 degrees of flexion: normal.  Posterolateral drawer: normal   Intra-operative findings: A thorough arthroscopic examination of the knee was performed.  The findings are: 1. Suprapatellar pouch: Normal 2. Undersurface of median ridge: Normal 3. Medial patellar facet: Normal 4. Lateral patellar facet: Normal 5. Trochlea: Normal 6. Lateral gutter/popliteus tendon: Normal 7. Hoffa's fat pad: Normal 8. Medial gutter/plica: Normal 9. ACL: Normal 10. PCL: Normal 11. Medial meniscus:  longitudinal tear mid body 12. Medial compartment cartilage: Normal 13. Lateral meniscus: Normal 14. Lateral compartment cartilage: Normal  I identified the patient in the pre-operative holding area.  I marked the operative knee with my initials. I reviewed the risks and benefits of the proposed surgical intervention and the patient wished to proceed.  Anesthesia performed a  peripheral nerve block.  Patient was subsequently taken back to the operating room.  The patient was transferred to the operative suite and placed in the supine position with all bony prominences padded.     SCDs were placed on the non-operative lower extremity. Appropriate antibiotics was administered within 1 hour before incision. The operative lower extremity was then prepped and draped in standard fashion. A time out was performed confirming the correct extremity, correct patient and correct procedure.    A standard anterolateral portal was made with an 11 blade.  The ideal position for the anteromedial portal was established using a spinal needle.  This AM portal was then created under direct visualization with an 11 blade.  A full diagnostic arthroscopy was then performed, as described above, including probing of the chondral and meniscal surfaces.     At this time, 2 all inside FasT-Fix's were used from the superior leaflet to the inferior leaflet of the medial meniscus to approximately the mid body tear.  This had excellent apposition.  The shaver was introduced and the medial plica was excised.  Flexion artery was used to achieve hemostasis  That concluded the case.  Skin was closed with 3-0 nylon. Xeroform gauze, gauze, Tegaderm, Iceman and brace were applied.  Instrument, sponge, and needle counts were correct prior to wound closure and at the conclusion of the case.  The patient was taken to the PACU without complication     POSTOPERATIVE PLAN: She'll be weightbearing and activity as tolerated.  I'll see her back in 2 weeks for suture removal.  Benancio Deeds, MD 12:23 PM

## 2024-01-15 ENCOUNTER — Ambulatory Visit (INDEPENDENT_AMBULATORY_CARE_PROVIDER_SITE_OTHER): Payer: 59 | Admitting: Orthopaedic Surgery

## 2024-01-15 DIAGNOSIS — M6752 Plica syndrome, left knee: Secondary | ICD-10-CM

## 2024-01-15 NOTE — Progress Notes (Signed)
 Post Operative Evaluation    Procedure/Date of Surgery: Left knee medial meniscal repair 2/13  Interval History:   Presents today 2-week status post above procedure.  She states that the knee has given out a couple times for mild swelling although she is continuing to improve with range of motion.  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   Past Surgical History:  Procedure Laterality Date   ABDOMINAL HYSTERECTOMY     ABLATION     BIOPSY  08/28/2023   Procedure: BIOPSY;  Surgeon: Wyline Mood, MD;  Location: [facility] ENDOSCOPY;  Service: Gastroenterology;;   CERVICAL BIOPSY  W/ LOOP ELECTRODE EXCISION     CHOLECYSTECTOMY     COLONOSCOPY WITH PROPOFOL N/A 08/28/2023   Procedure: COLONOSCOPY WITH PROPOFOL;  Surgeon: Wyline Mood, MD;  Location: [facility] ENDOSCOPY;  Service: Gastroenterology;  Laterality: N/A;   COLPOSCOPY     CYSTOSTOMY W/ BLADDER BIOPSY     ESOPHAGOGASTRODUODENOSCOPY (EGD) WITH PROPOFOL N/A 08/28/2023   Procedure: ESOPHAGOGASTRODUODENOSCOPY (EGD) WITH PROPOFOL;  Surgeon: Wyline Mood, MD;  Location: [facility] ENDOSCOPY;  Service: Gastroenterology;  Laterality: N/A;   HEMOSTASIS CLIP PLACEMENT  08/28/2023   Procedure: HEMOSTASIS CLIP PLACEMENT;  Surgeon: Wyline Mood, MD;  Location[facility] ENDOSCOPY;  Service: Gastroenterology;;   KNEE SURGERY     LEEP     POLYPECTOMY  08/28/2023   Procedure: POLYPECTOMY;  Surgeon: Wyline Mood, MD;  Location: [facility] ENDOSCOPY;  Service: Gastroenterology;;   RHINOPLASTY     TENDON REPAIR Left 07/26/2015   Procedure: REPAIR WITH RECONSTRUCTION LEFT THUMB;  Surgeon: Dairl Ponder, MD;  Location: [facility] OR;  Service:  Orthopedics;  Laterality: Left;   TONSILLECTOMY     TUBAL LIGATION     Social History   Socioeconomic History   Marital status: Divorced    Spouse name: Not on file   Number of children: 3   Years of education: 15   Highest education level: Not on file  Occupational History   Occupation: Holiday representative  Tobacco Use   Smoking status: Every Day    Current packs/day: 1.00    Average packs/day: 1 pack/day for 25.0 years (25.0 ttl pk-yrs)    Types: Cigarettes   Smokeless tobacco: Never  Vaping Use   Vaping status: Never Used  Substance and Sexual Activity   Alcohol use: Not Currently   Drug use: No   Sexual activity: Yes    Birth control/protection: Surgical  Other Topics Concern   Not on file  Social History Narrative   Fun: Fish and hunt   Denies abuse and feels safe at home.    Social Drivers of Corporate investment banker Strain: Not on file  Food Insecurity: Not on file  Transportation Needs: No Transportation Needs (06/13/2023)   PRAPARE - Administrator, Civil Service (Medical): No    Lack of Transportation (Non-Medical): No  Physical Activity: Not on file  Stress: Not on file  Social Connections: Not on  file   Family History  Problem Relation Age of Onset   Heart disease Mother    COPD Mother    Diabetes Father    Hypertension Father    Stroke Father    Heart disease Father    Breast cancer Maternal Grandmother    Cancer Maternal Grandfather        melanoma   Heart disease Paternal Grandmother    Stroke Paternal Grandmother    Heart attack Paternal Grandmother    Heart disease Paternal Grandfather    Stroke Paternal Grandfather    Heart attack Paternal Grandfather    Allergies  Allergen Reactions   Bee Venom Anaphylaxis   Flexeril [Cyclobenzaprine] Other (See Comments)    Reaction:  Makes pt violent    Lavender Oil Anaphylaxis and Other (See Comments)    Pt states that her lungs and sinuses fill-up with fluid.     Robaxin  [Methocarbamol] Other (See Comments)    Reaction:  Makes pt violent    Shellfish Allergy Hives   Amitriptyline Other (See Comments)    Reaction:  Hallucinations    Entex Other (See Comments)    Reaction:  Dizziness    Singulair [Montelukast Sodium] Other (See Comments)    Reaction:  Agitation  Pt states that her sinuses fill-up with fluid.     Etodolac Other (See Comments)    Reaction:  Dizziness    Iodine Hives    CT Contrast   Mushroom Extract Complex (Obsolete) Hives   Current Outpatient Medications  Medication Sig Dispense Refill   albuterol (VENTOLIN HFA) 108 (90 Base) MCG/ACT inhaler Inhale 1-2 puffs into the lungs every 6 (six) hours as needed. 18 g 0   aspirin EC 325 MG tablet Take 1 tablet (325 mg total) by mouth daily. 14 tablet 0   bismuth subsalicylate (PEPTO-BISMOL) 262 MG chewable tablet Chew 2 tablets (524 mg total) by mouth in the morning and at bedtime. 60 tablet 0   colestipol (COLESTID) 1 g tablet Take 2 tablets (2 g total) by mouth daily. 60 tablet 5   diphenhydrAMINE (BENADRYL) 25 MG tablet Take 50 mg by mouth every 6 (six) hours as needed for itching or allergies.      EPINEPHrine (EPIPEN 2-PAK) 0.3 mg/0.3 mL IJ SOAJ injection Inject 0.3 mg into the muscle once as needed (for severe allergic reaction).     hyoscyamine (LEVSIN) 0.125 MG tablet Take 1 tablet (0.125 mg total) by mouth every 4 (four) hours as needed. 90 tablet 5   meloxicam (MOBIC) 7.5 MG tablet Take 1 tablet by mouth 2 (two) times daily with a meal.     omeprazole (PRILOSEC) 40 MG capsule Take 1 capsule (40 mg total) by mouth daily. 30 capsule 1   ondansetron (ZOFRAN) 4 MG tablet Take 1 tablet (4 mg total) by mouth every 8 (eight) hours as needed for nausea or vomiting. 30 tablet 1   oxyCODONE (ROXICODONE) 5 MG immediate release tablet Take 1 tablet (5 mg total) by mouth every 4 (four) hours as needed for severe pain (pain score 7-10) or breakthrough pain. 10 tablet 0   Wheat Dextrin (BENEFIBER DRINK  MIX PO)      No current facility-administered medications for this visit.   No results found.  Review of Systems:   A ROS was performed including pertinent positives and negatives as documented in the HPI.   Musculoskeletal Exam:    Left knee incisions are well-appearing without erythema or drainage.  Motion is from 0  to 100 degrees without pain.  No joint line tenderness  Imaging:      I personally reviewed and interpreted the radiographs.   Assessment:   2-week status post left knee medial meniscal repair doing well.  At this time she will continue to work on range of motion and strengthening.  I will plan to see her back in 4 weeks for reassessment.  She will remain out of work until the time  Plan :    -Return to clinic 4 weeks for reassessment      I personally saw and evaluated the patient, and participated in the management and treatment plan.  Huel Cote, MD Attending Physician, Orthopedic Surgery  This document was dictated using Dragon voice recognition software. A reasonable attempt at proof reading has been made to minimize errors.

## 2024-01-16 ENCOUNTER — Ambulatory Visit: Payer: Self-pay

## 2024-01-16 NOTE — Patient Outreach (Signed)
 Care Coordination   01/16/2024 Name: Brandi Morris MRN: [id number] DOB: February 10, 1976   Care Coordination Outreach Attempts:  An unsuccessful outreach was attempted for an appointment today.  Follow Up Plan:  Additional outreach attempts will be made to offer the patient complex care management information and services.   Encounter Outcome:  No Answer   Care Coordination Interventions:  No, not indicated    Delsa Sale RN BSN CCM [place]  [facility], [facility] Nurse Care Coordinator  Direct Dial: [phone] Website: [email or web address]

## 2024-01-17 DIAGNOSIS — Z419 Encounter for procedure for purposes other than remedying health state, unspecified: Secondary | ICD-10-CM | POA: Diagnosis not present

## 2024-01-25 ENCOUNTER — Encounter (HOSPITAL_BASED_OUTPATIENT_CLINIC_OR_DEPARTMENT_OTHER): Payer: Self-pay | Admitting: Orthopaedic Surgery

## 2024-01-29 ENCOUNTER — Ambulatory Visit (INDEPENDENT_AMBULATORY_CARE_PROVIDER_SITE_OTHER): Admitting: Orthopaedic Surgery

## 2024-01-29 DIAGNOSIS — M25562 Pain in left knee: Secondary | ICD-10-CM

## 2024-01-29 MED ORDER — LIDOCAINE HCL 1 % IJ SOLN
4.0000 mL | INTRAMUSCULAR | Status: AC | PRN
  Administered 2024-01-29: 4 mL

## 2024-01-29 MED ORDER — TRIAMCINOLONE ACETONIDE 40 MG/ML IJ SUSP
80.0000 mg | INTRAMUSCULAR | Status: AC | PRN
  Administered 2024-01-29: 80 mg via INTRA_ARTICULAR

## 2024-01-29 NOTE — Progress Notes (Signed)
 Post Operative Evaluation    Procedure/Date of Surgery: Left knee medial meniscal repair 2/13  Interval History:   Presents today 4-week status post above procedure.  Unfortunately did have a twisting injury where she had to stand up and quickly change motion and since that time has had increasing pain and swelling in the knee.  PMH/PSH/Family History/Social History/Meds/Allergies:    Past Medical History:  Diagnosis Date  . Asthma    Exercise induced  . Endometriosis   . Hyperlipidemia   . Interstitial cystitis   . Kidney stone   . Migraine   . Multiple allergies   . Stroke [facility])    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   Past Surgical History:  Procedure Laterality Date  . ABDOMINAL HYSTERECTOMY    . ABLATION    . BIOPSY  08/28/2023   Procedure: BIOPSY;  Surgeon: Wyline Mood, MD;  Location: [facility] ENDOSCOPY;  Service: Gastroenterology;;  . CERVICAL BIOPSY  W/ LOOP ELECTRODE EXCISION    . CHOLECYSTECTOMY    . COLONOSCOPY WITH PROPOFOL N/A 08/28/2023   Procedure: COLONOSCOPY WITH PROPOFOL;  Surgeon: Wyline Mood, MD;  Location: [facility] ENDOSCOPY;  Service: Gastroenterology;  Laterality: N/A;  . COLPOSCOPY    . CYSTOSTOMY W/ BLADDER BIOPSY    . ESOPHAGOGASTRODUODENOSCOPY (EGD) WITH PROPOFOL N/A 08/28/2023   Procedure: ESOPHAGOGASTRODUODENOSCOPY (EGD) WITH PROPOFOL;  Surgeon: Wyline Mood, MD;  Location: [facility] ENDOSCOPY;  Service: Gastroenterology;  Laterality: N/A;  . HEMOSTASIS CLIP PLACEMENT  08/28/2023   Procedure: HEMOSTASIS CLIP PLACEMENT;  Surgeon: Wyline Mood, MD;  Location: [facility] ENDOSCOPY;  Service: Gastroenterology;;  . KNEE SURGERY    . LEEP    . POLYPECTOMY  08/28/2023   Procedure: POLYPECTOMY;  Surgeon: Wyline Mood, MD;  Location: [facility] ENDOSCOPY;  Service: Gastroenterology;;  . Carleene Cooper    . TENDON REPAIR Left 07/26/2015   Procedure: REPAIR WITH RECONSTRUCTION LEFT THUMB;  Surgeon: Dairl Ponder, MD;  Location: [facility] OR;   Service: Orthopedics;  Laterality: Left;  . TONSILLECTOMY    . TUBAL LIGATION     Social History   Socioeconomic History  . Marital status: Divorced    Spouse name: Not on file  . Number of children: 3  . Years of education: 58  . Highest education level: Not on file  Occupational History  . Occupation: Holiday representative  Tobacco Use  . Smoking status: Every Day    Current packs/day: 1.00    Average packs/day: 1 pack/day for 25.0 years (25.0 ttl pk-yrs)    Types: Cigarettes  . Smokeless tobacco: Never  Vaping Use  . Vaping status: Never Used  Substance and Sexual Activity  . Alcohol use: Not Currently  . Drug use: No  . Sexual activity: Yes    Birth control/protection: Surgical  Other Topics Concern  . Not on file  Social History Narrative   Fun: Fish and hunt   Denies abuse and feels safe at home.    Social Drivers of Corporate investment banker Strain: Not on file  Food Insecurity: Not on file  Transportation Needs: No Transportation Needs (06/13/2023)   PRAPARE - Transportation   . Lack of Transportation (Medical): No   . Lack of Transportation (Non-Medical): No  Physical Activity: Not on file  Stress: Not on file  Social Connections: Not on file   Family  History  Problem Relation Age of Onset  . Heart disease Mother   . COPD Mother   . Diabetes Father   . Hypertension Father   . Stroke Father   . Heart disease Father   . Breast cancer Maternal Grandmother   . Cancer Maternal Grandfather        melanoma  . Heart disease Paternal Grandmother   . Stroke Paternal Grandmother   . Heart attack Paternal Grandmother   . Heart disease Paternal Grandfather   . Stroke Paternal Grandfather   . Heart attack Paternal Grandfather    Allergies  Allergen Reactions  . Bee Venom Anaphylaxis  . Flexeril [Cyclobenzaprine] Other (See Comments)    Reaction:  Makes pt violent   . Lavender Oil Anaphylaxis and Other (See Comments)    Pt states that her lungs and sinuses  fill-up with fluid.    . Robaxin [Methocarbamol] Other (See Comments)    Reaction:  Makes pt violent   . Shellfish Allergy Hives  . Amitriptyline Other (See Comments)    Reaction:  Hallucinations   . Entex Other (See Comments)    Reaction:  Dizziness   . Singulair [Montelukast Sodium] Other (See Comments)    Reaction:  Agitation  Pt states that her sinuses fill-up with fluid.    . Etodolac Other (See Comments)    Reaction:  Dizziness   . Iodine Hives    CT Contrast  . Mushroom Extract Complex (Obsolete) Hives   Current Outpatient Medications  Medication Sig Dispense Refill  . albuterol (VENTOLIN HFA) 108 (90 Base) MCG/ACT inhaler Inhale 1-2 puffs into the lungs every 6 (six) hours as needed. 18 g 0  . aspirin EC 325 MG tablet Take 1 tablet (325 mg total) by mouth daily. 14 tablet 0  . bismuth subsalicylate (PEPTO-BISMOL) 262 MG chewable tablet Chew 2 tablets (524 mg total) by mouth in the morning and at bedtime. 60 tablet 0  . colestipol (COLESTID) 1 g tablet Take 2 tablets (2 g total) by mouth daily. 60 tablet 5  . diphenhydrAMINE (BENADRYL) 25 MG tablet Take 50 mg by mouth every 6 (six) hours as needed for itching or allergies.     Marland Kitchen EPINEPHrine (EPIPEN 2-PAK) 0.3 mg/0.3 mL IJ SOAJ injection Inject 0.3 mg into the muscle once as needed (for severe allergic reaction).    . hyoscyamine (LEVSIN) 0.125 MG tablet Take 1 tablet (0.125 mg total) by mouth every 4 (four) hours as needed. 90 tablet 5  . meloxicam (MOBIC) 7.5 MG tablet Take 1 tablet by mouth 2 (two) times daily with a meal.    . omeprazole (PRILOSEC) 40 MG capsule Take 1 capsule (40 mg total) by mouth daily. 30 capsule 1  . ondansetron (ZOFRAN) 4 MG tablet Take 1 tablet (4 mg total) by mouth every 8 (eight) hours as needed for nausea or vomiting. 30 tablet 1  . oxyCODONE (ROXICODONE) 5 MG immediate release tablet Take 1 tablet (5 mg total) by mouth every 4 (four) hours as needed for severe pain (pain score 7-10) or breakthrough  pain. 10 tablet 0  . Wheat Dextrin (BENEFIBER DRINK MIX PO)      No current facility-administered medications for this visit.   No results found.  Review of Systems:   A ROS was performed including pertinent positives and negatives as documented in the HPI.   Musculoskeletal Exam:    Left knee incisions are well-appearing without erythema or drainage.  Motion is from 0 to 100 degrees without  pain.  Diffuse tenderness about the IT band.  Imaging:      I personally reviewed and interpreted the radiographs.   Assessment:   4-week status post left knee medial meniscal repair with significant pain and inflammation after a quick twisting type mechanism.  At this time the knee does appear to be diffusely inflamed and to that effect I have recommended an IT band as well as intra-articular injection.  I will plan to see her back in 2 weeks for reassessment Plan :    -Return to clinic 2 weeks for reassessment   Knee ultrasound-guided injection provided after verbal consent obtained  Procedure Note  Patient: Brandi Morris             Date of Birth: 23-May-1976           MRN: [id number]             Visit Date: 01/29/2024  Procedures: Visit Diagnoses: No diagnosis found.  Large Joint Inj: L knee on 01/29/2024 10:27 AM Indications: pain Details: 22 G 1.5 in needle, ultrasound-guided anterior approach  Arthrogram: No  Medications: 4 mL lidocaine 1 %; 80 mg triamcinolone acetonide 40 MG/ML Outcome: tolerated well, no immediate complications Procedure, treatment alternatives, risks and benefits explained, specific risks discussed. Consent was given by the patient. Immediately prior to procedure a time out was called to verify the correct patient, procedure, equipment, support staff and site/side marked as required. Patient was prepped and draped in the usual sterile fashion.        I personally saw and evaluated the patient, and participated in the management and treatment  plan.  Huel Cote, MD Attending Physician, Orthopedic Surgery  This document was dictated using Dragon voice recognition software. A reasonable attempt at proof reading has been made to minimize errors.

## 2024-02-04 ENCOUNTER — Ambulatory Visit (INDEPENDENT_AMBULATORY_CARE_PROVIDER_SITE_OTHER): Admitting: Student

## 2024-02-04 VITALS — Ht 62.0 in | Wt 168.0 lb

## 2024-02-04 DIAGNOSIS — G8929 Other chronic pain: Secondary | ICD-10-CM | POA: Insufficient documentation

## 2024-02-04 DIAGNOSIS — M25562 Pain in left knee: Secondary | ICD-10-CM

## 2024-02-04 DIAGNOSIS — Z9889 Other specified postprocedural states: Secondary | ICD-10-CM | POA: Diagnosis not present

## 2024-02-04 MED ORDER — LIDOCAINE HCL 1 % IJ SOLN
4.0000 mL | INTRAMUSCULAR | Status: AC | PRN
  Administered 2024-02-04: 4 mL

## 2024-02-04 MED ORDER — BETAMETHASONE SOD PHOS & ACET 6 (3-3) MG/ML IJ SUSP
12.0000 mg | Freq: Once | INTRAMUSCULAR | Status: AC
  Administered 2024-02-04: 12 mg via INTRA_ARTICULAR

## 2024-02-04 MED ORDER — TRIAMCINOLONE ACETONIDE 40 MG/ML IJ SUSP
2.0000 mL | INTRAMUSCULAR | Status: AC | PRN
Start: 2024-02-04 — End: 2024-02-04
  Administered 2024-02-04: 2 mL via INTRA_ARTICULAR

## 2024-02-04 NOTE — Addendum Note (Signed)
 Addended byCaffie Damme on: 02/04/2024 11:16 AM   Modules accepted: Orders

## 2024-02-04 NOTE — Progress Notes (Signed)
 Post Operative Evaluation    Procedure/Date of Surgery: Left knee medial meniscal repair 2/13  Interval History:   Patient presents today now 5 weeks status post left medial meniscal repair.  She reports that IT band injection performed last week has significantly improved her lateral pain.  She reports today that pain in the medial knee particularly is worse.  She does have some posterior knee pain as well.  She reports still being unable to go up or down stairs due to pain.  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   Past Surgical History:  Procedure Laterality Date   ABDOMINAL HYSTERECTOMY     ABLATION     BIOPSY  08/28/2023   Procedure: BIOPSY;  Surgeon: Wyline Mood, MD;  Location: [facility] ENDOSCOPY;  Service: Gastroenterology;;   CERVICAL BIOPSY  W/ LOOP ELECTRODE EXCISION     CHOLECYSTECTOMY     COLONOSCOPY WITH PROPOFOL N/A 08/28/2023   Procedure: COLONOSCOPY WITH PROPOFOL;  Surgeon: Wyline Mood, MD;  Location: [facility] ENDOSCOPY;  Service: Gastroenterology;  Laterality: N/A;   COLPOSCOPY     CYSTOSTOMY W/ BLADDER BIOPSY     ESOPHAGOGASTRODUODENOSCOPY (EGD) WITH PROPOFOL N/A 08/28/2023   Procedure: ESOPHAGOGASTRODUODENOSCOPY (EGD) WITH PROPOFOL;  Surgeon: Wyline Mood, MD;  Location: [facility] ENDOSCOPY;  Service: Gastroenterology;  Laterality: N/A;   HEMOSTASIS CLIP PLACEMENT  08/28/2023   Procedure: HEMOSTASIS CLIP PLACEMENT;  Surgeon: Wyline Mood, MD;  Location: [facility] ENDOSCOPY;  Service: Gastroenterology;;   KNEE SURGERY     LEEP     POLYPECTOMY  08/28/2023   Procedure: POLYPECTOMY;  Surgeon: Wyline Mood, MD;  Location: [facility] ENDOSCOPY;  Service: Gastroenterology;;   RHINOPLASTY     TENDON  REPAIR Left 07/26/2015   Procedure: REPAIR WITH RECONSTRUCTION LEFT THUMB;  Surgeon: Dairl Ponder, MD;  Location: [facility] OR;  Service: Orthopedics;  Laterality: Left;   TONSILLECTOMY     TUBAL LIGATION     Social History   Socioeconomic History   Marital status: Divorced    Spouse name: Not on file   Number of children: 3   Years of education: 15   Highest education level: Not on file  Occupational History   Occupation: Holiday representative  Tobacco Use   Smoking status: Every Day    Current packs/day: 1.00    Average packs/day: 1 pack/day for 25.0 years (25.0 ttl pk-yrs)    Types: Cigarettes   Smokeless tobacco: Never  Vaping Use   Vaping status: Never Used  Substance and Sexual Activity   Alcohol use: Not Currently   Drug use: No   Sexual activity: Yes    Birth control/protection: Surgical  Other Topics Concern   Not on file  Social History Narrative   Fun: Fish and hunt   Denies abuse and feels safe at home.    Social Drivers of Corporate investment banker Strain: Not on file  Food Insecurity: Not on file  Transportation Needs: No Transportation Needs (06/13/2023)   PRAPARE - Administrator, Civil Service (Medical):  No    Lack of Transportation (Non-Medical): No  Physical Activity: Not on file  Stress: Not on file  Social Connections: Not on file   Family History  Problem Relation Age of Onset   Heart disease Mother    COPD Mother    Diabetes Father    Hypertension Father    Stroke Father    Heart disease Father    Breast cancer Maternal Grandmother    Cancer Maternal Grandfather        melanoma   Heart disease Paternal Grandmother    Stroke Paternal Grandmother    Heart attack Paternal Grandmother    Heart disease Paternal Grandfather    Stroke Paternal Grandfather    Heart attack Paternal Grandfather    Allergies  Allergen Reactions   Bee Venom Anaphylaxis   Flexeril [Cyclobenzaprine] Other (See Comments)    Reaction:  Makes pt violent     Lavender Oil Anaphylaxis and Other (See Comments)    Pt states that her lungs and sinuses fill-up with fluid.     Robaxin [Methocarbamol] Other (See Comments)    Reaction:  Makes pt violent    Shellfish Allergy Hives   Amitriptyline Other (See Comments)    Reaction:  Hallucinations    Entex Other (See Comments)    Reaction:  Dizziness    Singulair [Montelukast Sodium] Other (See Comments)    Reaction:  Agitation  Pt states that her sinuses fill-up with fluid.     Etodolac Other (See Comments)    Reaction:  Dizziness    Iodine Hives    CT Contrast   Mushroom Extract Complex (Obsolete) Hives   Current Outpatient Medications  Medication Sig Dispense Refill   albuterol (VENTOLIN HFA) 108 (90 Base) MCG/ACT inhaler Inhale 1-2 puffs into the lungs every 6 (six) hours as needed. 18 g 0   aspirin EC 325 MG tablet Take 1 tablet (325 mg total) by mouth daily. 14 tablet 0   bismuth subsalicylate (PEPTO-BISMOL) 262 MG chewable tablet Chew 2 tablets (524 mg total) by mouth in the morning and at bedtime. 60 tablet 0   colestipol (COLESTID) 1 g tablet Take 2 tablets (2 g total) by mouth daily. 60 tablet 5   diphenhydrAMINE (BENADRYL) 25 MG tablet Take 50 mg by mouth every 6 (six) hours as needed for itching or allergies.      EPINEPHrine (EPIPEN 2-PAK) 0.3 mg/0.3 mL IJ SOAJ injection Inject 0.3 mg into the muscle once as needed (for severe allergic reaction).     hyoscyamine (LEVSIN) 0.125 MG tablet Take 1 tablet (0.125 mg total) by mouth every 4 (four) hours as needed. 90 tablet 5   meloxicam (MOBIC) 7.5 MG tablet Take 1 tablet by mouth 2 (two) times daily with a meal.     omeprazole (PRILOSEC) 40 MG capsule Take 1 capsule (40 mg total) by mouth daily. 30 capsule 1   ondansetron (ZOFRAN) 4 MG tablet Take 1 tablet (4 mg total) by mouth every 8 (eight) hours as needed for nausea or vomiting. 30 tablet 1   oxyCODONE (ROXICODONE) 5 MG immediate release tablet Take 1 tablet (5 mg total) by mouth every 4  (four) hours as needed for severe pain (pain score 7-10) or breakthrough pain. 10 tablet 0   Wheat Dextrin (BENEFIBER DRINK MIX PO)      No current facility-administered medications for this visit.   No results found.  Review of Systems:   A ROS was performed including pertinent positives and negatives as  documented in the HPI.   Musculoskeletal Exam:    Left knee arthroscopic incisions are well-appearing without evidence of erythema or drainage.  Pinpoint tenderness along the medial joint line.  Mild soft tissue edema and effusion present.  Imaging:     Assessment:   48 year old female 5 weeks status post left knee medial meniscal repair.  She is continuing to have significant medial based pain which is affecting her daily activities.  She does report a twisting injury approximately 1 week after surgery so there is some concern for a reinjury considering her ongoing symptoms.  Pain may also be result of more of a synovitis so I have recommended trial of a intra-articular cortisone injection today which she is agreeable to.  Injection was performed today without any complication.  Will plan to assess relief with this and return as needed.  Plan :    -Cortisone injection performed of the left knee joint today -Return to clinic as needed     Procedure Note  Patient: Brandi Morris             Date of Birth: [birth year]           MRN: [id number]             Visit Date: 02/04/2024  Procedures: Visit Diagnoses:  1. Chronic pain of left knee   2. S/P left knee arthroscopy 01/01/24 medial meniscus repair     Large Joint Inj: L knee on 02/04/2024 10:47 AM Indications: pain Details: 22 G 1.5 in needle, anteromedial approach Medications: 4 mL lidocaine 1 %; 2 mL triamcinolone acetonide 40 MG/ML Outcome: tolerated well, no immediate complications Procedure, treatment alternatives, risks and benefits explained, specific risks discussed. Consent was given by the patient. Immediately  prior to procedure a time out was called to verify the correct patient, procedure, equipment, support staff and site/side marked as required. Patient was prepped and draped in the usual sterile fashion.      I personally saw and evaluated the patient, and participated in the management and treatment plan.   Hazle Nordmann, PA-C Orthopedics

## 2024-02-09 ENCOUNTER — Other Ambulatory Visit: Payer: Self-pay | Admitting: Orthopaedic Surgery

## 2024-02-09 MED ORDER — TRAMADOL HCL 50 MG PO TABS: 50.0000 mg | ORAL_TABLET | Freq: Four times a day (QID) | ORAL | 0 refills | Status: DC | PRN

## 2024-02-09 MED ORDER — METHYLPREDNISOLONE 4 MG PO TBPK: ORAL_TABLET | ORAL | 0 refills | Status: DC

## 2024-02-10 ENCOUNTER — Encounter (HOSPITAL_BASED_OUTPATIENT_CLINIC_OR_DEPARTMENT_OTHER): Payer: Self-pay | Admitting: Orthopaedic Surgery

## 2024-02-10 ENCOUNTER — Other Ambulatory Visit (HOSPITAL_BASED_OUTPATIENT_CLINIC_OR_DEPARTMENT_OTHER): Payer: Self-pay | Admitting: Orthopaedic Surgery

## 2024-02-10 DIAGNOSIS — Z9889 Other specified postprocedural states: Secondary | ICD-10-CM

## 2024-02-13 ENCOUNTER — Telehealth: Payer: Self-pay | Admitting: *Deleted

## 2024-02-13 NOTE — Progress Notes (Signed)
 Complex Care Management Care Guide Note  02/13/2024 Name: STEPAHNIE CAMPO MRN: [id number] DOB: 1976-04-16  Nance Pear Smiddy is a 48 y.o. year old female who is a primary care patient of Tysinger, Cleda Mccreedy and is actively engaged with the care management team. I reached out to Isabella Stalling by phone today to assist with re-scheduling  with the RN Case Manager.  Follow up plan: Unsuccessful telephone outreach attempt made. A HIPAA compliant phone message was left for the patient providing contact information and requesting a return call.  Gwenevere Ghazi  [facility]  Value-Based Care Institute, [facility] Guide  Direct Dial: [phone]  Fax [phone]

## 2024-02-19 ENCOUNTER — Other Ambulatory Visit: Payer: Self-pay

## 2024-02-19 ENCOUNTER — Ambulatory Visit: Attending: Orthopaedic Surgery | Admitting: Physical Therapy

## 2024-02-19 DIAGNOSIS — M25562 Pain in left knee: Secondary | ICD-10-CM | POA: Diagnosis not present

## 2024-02-19 DIAGNOSIS — M25662 Stiffness of left knee, not elsewhere classified: Secondary | ICD-10-CM | POA: Diagnosis not present

## 2024-02-19 DIAGNOSIS — Z9889 Other specified postprocedural states: Secondary | ICD-10-CM | POA: Diagnosis not present

## 2024-02-19 DIAGNOSIS — R600 Localized edema: Secondary | ICD-10-CM | POA: Diagnosis not present

## 2024-02-19 DIAGNOSIS — M6281 Muscle weakness (generalized): Secondary | ICD-10-CM | POA: Diagnosis not present

## 2024-02-19 DIAGNOSIS — R262 Difficulty in walking, not elsewhere classified: Secondary | ICD-10-CM | POA: Insufficient documentation

## 2024-02-19 NOTE — Therapy (Signed)
 OUTPATIENT PHYSICAL THERAPY LOWER EXTREMITY EVALUATION   Patient Name: Brandi Morris MRN: [id number] DOB:06-Sep-1976, 48 y.o., female Today's Date: 02/19/2024  END OF SESSION:  PT End of Session - 02/19/24 0846     Visit Number 1    Number of Visits 12    Date for PT Re-Evaluation 04/01/24    Authorization Type [employer]    PT Start Time [phone]    PT Stop Time 0925    PT Time Calculation (min) 39 min    Activity Tolerance Patient tolerated treatment well             Past Medical History:  Diagnosis Date   Asthma    Exercise induced   Endometriosis    Hyperlipidemia    Interstitial cystitis    Kidney stone    Migraine    Multiple allergies    Stroke (HCC)    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   Past Surgical History:  Procedure Laterality Date   ABDOMINAL HYSTERECTOMY     ABLATION     BIOPSY  08/28/2023   Procedure: BIOPSY;  Surgeon: Wyline Mood, MD;  Location: [facility] ENDOSCOPY;  Service: Gastroenterology;;   CERVICAL BIOPSY  W/ LOOP ELECTRODE EXCISION     CHOLECYSTECTOMY     COLONOSCOPY WITH PROPOFOL N/A 08/28/2023   Procedure: COLONOSCOPY WITH PROPOFOL;  Surgeon: Wyline Mood, MD;  Location: [facility] ENDOSCOPY;  Service: Gastroenterology;  Laterality: N/A;   COLPOSCOPY     CYSTOSTOMY W/ BLADDER BIOPSY     ESOPHAGOGASTRODUODENOSCOPY (EGD) WITH PROPOFOL N/A 08/28/2023   Procedure: ESOPHAGOGASTRODUODENOSCOPY (EGD) WITH PROPOFOL;  Surgeon: Wyline Mood, MD;  Location: [facility] ENDOSCOPY;  Service: Gastroenterology;  Laterality: N/A;   HEMOSTASIS CLIP PLACEMENT  08/28/2023   Procedure: HEMOSTASIS CLIP PLACEMENT;  Surgeon: Wyline Mood, MD;  Location: [facility] ENDOSCOPY;  Service: Gastroenterology;;   KNEE SURGERY     LEEP     POLYPECTOMY  08/28/2023   Procedure: POLYPECTOMY;  Surgeon: Wyline Mood, MD;  Location: [facility] ENDOSCOPY;  Service: Gastroenterology;;   RHINOPLASTY     TENDON REPAIR Left 07/26/2015   Procedure: REPAIR WITH RECONSTRUCTION LEFT THUMB;  Surgeon:  Dairl Ponder, MD;  Location: [facility] OR;  Service: Orthopedics;  Laterality: Left;   TONSILLECTOMY     TUBAL LIGATION     Patient Active Problem List   Diagnosis Date Noted   S/P left knee arthroscopy 01/01/24 medial meniscus repair 02/04/2024   Chronic pain of left knee 02/04/2024   Adenomatous polyp of colon 08/28/2023   Diarrhea 07/08/2023   Colon cancer screening 07/08/2023   Anxiety state 07/08/2023   Weight loss, abnormal 04/23/2023   Fever 04/23/2023   Night sweat 04/23/2023   Nausea and vomiting 04/23/2023   S/P cholecystectomy 04/23/2023   S/P hysterectomy 04/23/2023   Localized swelling of left thumb 09/06/2021   Osteoarthritis of carpometacarpal (CMC) joint of thumb 01/04/2021   Pain in thumb joint with movement, left 01/04/2021   Dyspareunia in female 03/23/2019   Chronic pelvic pain in female 03/23/2019   Hyperlipidemia 07/30/2007   Migraine 07/30/2007   ALLERGIC RHINITIS 07/30/2007   Asthma 07/30/2007   INTERSTITIAL CYSTITIS 07/30/2007   INSOMNIA 07/30/2007    PCP: Jac Canavan, PA-C  REFERRING PROVIDER: Huel Cote, MD  REFERRING DIAG:  [phone] (ICD-10-CM) - S/P left knee arthroscopy    THERAPY DIAG:  Acute pain of left knee  Stiffness of left knee, not elsewhere classified  Muscle weakness (generalized)  Localized edema  Difficulty in walking, not elsewhere  classified  Rationale for Evaluation and Treatment: Rehabilitation  ONSET DATE: S/P left knee arthroscopy 01/01/24 medial meniscus repair, twisting re injury 1 week after  SUBJECTIVE:   SUBJECTIVE STATEMENT: Pt states it has not been going well since the knee surgery -- first 9 days were good. Pt has custody of her niece (5 1/2 years old). Reports her niece started to choke and she jumped up out of the recliner and twisted on her knee. Pt states it hasn't started to hurt. Pt reports medial and anterior knee pain. Walking hurts. Pt states she gets a knot on her quad if she's on her feet  for 4 hours. Feels that she has re torn her meniscus and pain is back with a vengeance. Only gets relief with leg up on a pillow and slightly bent. Has been trying to work her knee. Pt states she has tried to keep her knee straight. Pt has iced. Injection helped the lateral side of the knee. Injection on the medial knee hurt her a lot. Has tried to wear a brace.   PERTINENT HISTORY: S/P left knee arthroscopy 01/01/24 medial meniscus repair  PAIN:  Are you having pain? Yes: NPRS scale: 2-3 currently, at worst 6 with stairs Pain location: L medial knee, anterior knee Pain description: can vary (throbbing but can be sharp and pulling under knee cap) Aggravating factors: walking, stairs are the most difficult Relieving factors: Ibuprofen/tylenol, knee slightly bent  PRECAUTIONS: Fall  RED FLAGS: None   WEIGHT BEARING RESTRICTIONS: No  FALLS:  Has patient fallen in last 6 months? Yes. 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  DIAGNOSTIC FINDINGS: nothing new since her arthroscopy  PATIENT SURVEYS:  Lower Extremity Functional Score: 23 / 80 = 28.7 %  COGNITION: Overall cognitive status: Within functional limits for tasks assessed     SENSATION: WFL  EDEMA:  Mild currently  MUSCLE LENGTH: Did not assess  POSTURE: weight shift right  PALPATION: TTP L patellar tendon, gerdy's tubercle, lateral quad, posterior thigh  LOWER EXTREMITY  ROM:  Active ROM Right eval Left eval  Hip flexion    Hip extension    Hip abduction    Hip adduction    Hip internal rotation    Hip external rotation    Knee flexion  85 AROM (feels a pain in the back of knee)  85 PROM attempted but empty end feel  Knee extension  -15 AROM (pain in front of knee) 0 PROM but unable to tolerate for prolonged period  Ankle dorsiflexion    Ankle plantarflexion    Ankle inversion    Ankle eversion     (Blank rows = not tested)  LOWER EXTREMITY MMT:  MMT Right eval Left eval  Hip flexion 5 3+  Hip extension 4 4  Hip abduction 5 3+  Hip adduction 5 3+  Hip internal rotation    Hip external rotation    Knee flexion 5  4  Knee extension 5 4  Ankle dorsiflexion    Ankle plantarflexion    Ankle inversion    Ankle eversion     (Blank rows = not tested)  LOWER EXTREMITY SPECIAL TESTS:  Pt too tender to attempt much of special testing Pain with valgus and varus knee testing Hypomobile with medial and lateral patellar mobilizations  FUNCTIONAL TESTS:  5 times sit to stand: TBA Timed up and go (TUG): TBA  GAIT: Distance walked: Into clinic Assistive device utilized: None Level of assistance: SBA Comments: Antalgic, diminished stance time on L, decreased knee flexion on L with swing, widened BOS                                                                                                                                TREATMENT DATE: 02/19/24 See Hep below Reviewed how to walk with SPC to decrease weight on L LE    PATIENT EDUCATION:  Education details: Exam findings, POC, initial HEP, gait with cane Person educated: Patient Education method: Explanation, Demonstration, and Handouts Education comprehension: verbalized understanding, returned demonstration, and needs further education  HOME EXERCISE PROGRAM: Access Code: [email or web address] URL: [email or web address] Date: 02/19/2024 Prepared by: Vernon Prey April Kirstie Peri  Exercises - Hooklying Isometric Clamshell  - 1 x daily - 7 x weekly - 2 sets - 10 reps - Supine Knee Extension Strengthening  - 1 x daily - 7 x weekly - 2 sets - 10 reps - Straight Leg Raise with External Rotation  - 1 x daily - 7 x weekly - 2 sets - 10 reps - Supine Bridge with Mini Swiss Ball Between Knees  - 1 x daily - 7 x weekly - 2 sets - 10 reps  ASSESSMENT:  CLINICAL IMPRESSION: Patient is a 48 y.o. F who was seen today for physical therapy evaluation and treatment for L knee pain s/p L knee arthroscopy. Pt reports feeling good after initial arthroscopy but then reinjured her knee with a twisting motion about 8-9 days post op. Some good relief to lateral knee with injection but did not get as much medial knee pain relief with the second injection. Assessment is significant for decreased L knee ROM, lateral/medial knee instability with concurrent hip weakness, hypomobile L patella, with increased tenderness to palpation about her patellar tendon and popliteal fossa limiting amb and stairs for home and community tasks. Discussed trial of PT to see if we can decrease inflammation and improve strength/stability and then returning to ortho for any further imaging/medical needs. Discussed using cane for a week or so to see if by decreasing load we can get the knee pain to calm a little. Pt cares for multiple young children and is currently out of work.   OBJECTIVE IMPAIRMENTS: Abnormal gait, decreased activity tolerance, decreased balance, decreased coordination, decreased endurance, decreased mobility, difficulty walking, decreased ROM, decreased strength, increased edema, increased fascial restrictions, increased muscle spasms, improper body mechanics, postural dysfunction,  and pain.   ACTIVITY LIMITATIONS: carrying, lifting, bending, sitting, standing, squatting, stairs, transfers, bathing, toileting, dressing, hygiene/grooming, locomotion level, and caring for others  PARTICIPATION  LIMITATIONS: meal prep, cleaning, laundry, driving, shopping, community activity, occupation, and yard work  PERSONAL FACTORS: Age, Fitness, Past/current experiences, and Time since onset of injury/illness/exacerbation are also affecting patient's functional outcome.   REHAB POTENTIAL: Good  CLINICAL DECISION MAKING: Evolving/moderate complexity  EVALUATION COMPLEXITY: Moderate   GOALS: Goals reviewed with patient? Yes  SHORT TERM GOALS: Target date: 03/11/2024  Pt will be ind with initial HEP Baseline: Goal status: INITIAL  2.  Pt will have improved L knee ROM from 0 to 90 deg with tolerable pain Baseline:  Goal status: INITIAL    LONG TERM GOALS: Target date: 04/01/2024   Pt will be ind with management and progression of HEP Baseline:  Goal status: INITIAL  2.  Pt will have improved L knee ROM to 0 - 110 deg for improved stair ascent/descent Baseline:  Goal status: INITIAL  3.  Pt will be able to amb 1000' ind without antalgic gait pattern for community mobility Baseline:  Goal status: INITIAL  4.  Pt will demo 5x STS of </=13 sec for low fall risk Baseline:  Goal status: INITIAL  5.  Pt will have improved LEFS to to >/=38.7% Baseline:  Goal status: INITIAL  6.  Pt will have a TUG of </=13 sec for low fall risk Baseline:  Goal status: INITIAL   PLAN:  PT FREQUENCY: 1-2x/week  PT DURATION: 6 weeks  PLANNED INTERVENTIONS: 97164- PT Re-evaluation, 97110-Therapeutic exercises, 97530- Therapeutic activity, [id number]- Neuromuscular re-education, 97535- Self Care, 16109- Manual therapy, [id number]- Gait training, [id number]- Aquatic Therapy, [phone]- Electrical stimulation (unattended), 97016- Vasopneumatic device, [id number]- Ultrasound, [id number]- Ionotophoresis 4mg /ml Dexamethasone, Balance training, Stair training, Taping, Dry Needling, Joint mobilization, Cryotherapy, and Moist heat  PLAN FOR NEXT SESSION: Assess response to HEP. Consider ionto and/or taping for knee. Continue  lateral/medial knee stability strengthening and quad strengthening. Gentle knee ROM as tolerated. Patellar mobilizations. Gait training with SPC as indicated.    Obera Stauch April Ma L Charita Lindenberger, PT 02/19/2024, 1:50 PM

## 2024-02-24 NOTE — Progress Notes (Signed)
 Complex Care Management Care Guide Note  02/24/2024 Name: Brandi Morris MRN: [id number] DOB: Dec 05, 1975  [name] is a 48 y.o. year old female who is a primary care patient of Tysinger, Cleda Mccreedy and is actively engaged with the care management team. I reached out to Isabella Stalling by phone today to assist with re-scheduling  with the RN Case Manager.  Follow up plan: Unsuccessful telephone outreach attempt made. A HIPAA compliant phone message was left for the patient providing contact information and requesting a return call. No further outreach attempts will be made at this time. We have been unable to contact the patient to reschedule for complex care management services.   Gwenevere Ghazi  [facility]  Value-Based Care Institute, [facility] Guide  Direct Dial: [phone]  Fax [phone]

## 2024-02-25 ENCOUNTER — Ambulatory Visit

## 2024-02-25 DIAGNOSIS — R600 Localized edema: Secondary | ICD-10-CM | POA: Diagnosis not present

## 2024-02-25 DIAGNOSIS — Z9889 Other specified postprocedural states: Secondary | ICD-10-CM | POA: Diagnosis not present

## 2024-02-25 DIAGNOSIS — R262 Difficulty in walking, not elsewhere classified: Secondary | ICD-10-CM | POA: Diagnosis not present

## 2024-02-25 DIAGNOSIS — M25662 Stiffness of left knee, not elsewhere classified: Secondary | ICD-10-CM | POA: Diagnosis not present

## 2024-02-25 DIAGNOSIS — M6281 Muscle weakness (generalized): Secondary | ICD-10-CM

## 2024-02-25 DIAGNOSIS — M25562 Pain in left knee: Secondary | ICD-10-CM | POA: Diagnosis not present

## 2024-02-25 NOTE — Therapy (Signed)
 OUTPATIENT PHYSICAL THERAPY TREATMENT   Patient Name: NICHOLL ONSTOTT MRN: [id number] DOB:07/05/1976, 48 y.o., female Today's Date: 02/25/2024  END OF SESSION:  PT End of Session - 02/25/24 1016     Visit Number 2    Date for PT Re-Evaluation 04/01/24    Authorization Type [employer]- requested visits    PT Start Time 0932    PT Stop Time 1026    PT Time Calculation (min) 54 min    Activity Tolerance Patient tolerated treatment well    Behavior During Therapy WFL for tasks assessed/performed              Past Medical History:  Diagnosis Date   Asthma    Exercise induced   Endometriosis    Hyperlipidemia    Interstitial cystitis    Kidney stone    Migraine    Multiple allergies    Stroke (HCC)    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   Past Surgical History:  Procedure Laterality Date   ABDOMINAL HYSTERECTOMY     ABLATION     BIOPSY  08/28/2023   Procedure: BIOPSY;  Surgeon: Wyline Mood, MD;  Location: [facility] ENDOSCOPY;  Service: Gastroenterology;;   CERVICAL BIOPSY  W/ LOOP ELECTRODE EXCISION     CHOLECYSTECTOMY     COLONOSCOPY WITH PROPOFOL N/A 08/28/2023   Procedure: COLONOSCOPY WITH PROPOFOL;  Surgeon: Wyline Mood, MD;  Location: [facility] ENDOSCOPY;  Service: Gastroenterology;  Laterality: N/A;   COLPOSCOPY     CYSTOSTOMY W/ BLADDER BIOPSY     ESOPHAGOGASTRODUODENOSCOPY (EGD) WITH PROPOFOL N/A 08/28/2023   Procedure: ESOPHAGOGASTRODUODENOSCOPY (EGD) WITH PROPOFOL;  Surgeon: Wyline Mood, MD;  Location: [facility] ENDOSCOPY;  Service: Gastroenterology;  Laterality: N/A;   HEMOSTASIS CLIP PLACEMENT  08/28/2023   Procedure: HEMOSTASIS CLIP PLACEMENT;  Surgeon: Wyline Mood, MD;  Location: [facility] ENDOSCOPY;  Service: Gastroenterology;;   KNEE SURGERY     LEEP     POLYPECTOMY  08/28/2023   Procedure: POLYPECTOMY;  Surgeon: Wyline Mood, MD;  Location: [facility] ENDOSCOPY;  Service: Gastroenterology;;   RHINOPLASTY     TENDON REPAIR Left 07/26/2015   Procedure: REPAIR WITH  RECONSTRUCTION LEFT THUMB;  Surgeon: Dairl Ponder, MD;  Location: [facility] OR;  Service: Orthopedics;  Laterality: Left;   TONSILLECTOMY     TUBAL LIGATION     Patient Active Problem List   Diagnosis Date Noted   S/P left knee arthroscopy 01/01/24 medial meniscus repair 02/04/2024   Chronic pain of left knee 02/04/2024   Adenomatous polyp of colon 08/28/2023   Diarrhea 07/08/2023   Colon cancer screening 07/08/2023   Anxiety state 07/08/2023   Weight loss, abnormal 04/23/2023   Fever 04/23/2023   Night sweat 04/23/2023   Nausea and vomiting 04/23/2023   S/P cholecystectomy 04/23/2023   S/P hysterectomy 04/23/2023   Localized swelling of left thumb 09/06/2021   Osteoarthritis of carpometacarpal (CMC) joint of thumb 01/04/2021   Pain in thumb joint with movement, left 01/04/2021   Dyspareunia in female 03/23/2019   Chronic pelvic pain in female 03/23/2019   Hyperlipidemia 07/30/2007   Migraine 07/30/2007   ALLERGIC RHINITIS 07/30/2007   Asthma 07/30/2007   INTERSTITIAL CYSTITIS 07/30/2007   INSOMNIA 07/30/2007    PCP: Jac Canavan, PA-C  REFERRING PROVIDER: Huel Cote, MD  REFERRING DIAG:  [id number] (ICD-10-CM) - S/P left knee arthroscopy    THERAPY DIAG:  Acute pain of left knee  Stiffness of left knee, not elsewhere classified  Muscle weakness (generalized)  Localized edema  Difficulty  in walking, not elsewhere classified  Rationale for Evaluation and Treatment: Rehabilitation  ONSET DATE: S/P left knee arthroscopy 01/01/24 medial meniscus repair, twisting re injury 1 week after  SUBJECTIVE:   SUBJECTIVE STATEMENT: I am having a lot of pain.  Exercises are helping but my knee buckles.  I fell down the steps when walking with my niece.     From Eval: Pt states it has not been going well since the knee surgery -- first 9 days were good. Pt has custody of her niece (75 1/2 years old). Reports her niece started to choke and she jumped up out of the recliner  and twisted on her knee. Pt states it hasn't started to hurt. Pt reports medial and anterior knee pain. Walking hurts. Pt states she gets a knot on her quad if she's on her feet for 4 hours. Feels that she has re torn her meniscus and pain is back with a vengeance. Only gets relief with leg up on a pillow and slightly bent. Has been trying to work her knee. Pt states she has tried to keep her knee straight. Pt has iced. Injection helped the lateral side of the knee. Injection on the medial knee hurt her a lot. Has tried to wear a brace.   PERTINENT HISTORY: S/P left knee arthroscopy 01/01/24 medial meniscus repair  PAIN: 02/25/24:  Are you having pain? Yes: NPRS scale: 5/10 Pain location: L medial knee, anterior knee Pain description: can vary (throbbing but can be sharp and pulling under knee cap) Aggravating factors: walking, stairs are the most difficult Relieving factors: Ibuprofen/tylenol, knee slightly bent  PRECAUTIONS: Fall  RED FLAGS: None   WEIGHT BEARING RESTRICTIONS: No  FALLS:  Has patient fallen in last 6 months? Yes. 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  DIAGNOSTIC FINDINGS: nothing new since her arthroscopy  PATIENT SURVEYS:  Lower Extremity Functional Score: 23 / 80 = 28.7 %  COGNITION: Overall cognitive status: Within functional limits for tasks assessed     SENSATION: WFL  EDEMA:  Mild currently  MUSCLE  LENGTH: Did not assess  POSTURE: weight shift right  PALPATION: TTP L patellar tendon, gerdy's tubercle, lateral quad, posterior thigh  LOWER EXTREMITY ROM:  Active ROM Right eval Left eval  Hip flexion    Hip extension    Hip abduction    Hip adduction    Hip internal rotation    Hip external rotation    Knee flexion  85 AROM (feels a pain in the back of knee)  85 PROM attempted but empty end feel  Knee extension  -15 AROM (pain in front of knee) 0 PROM but unable to tolerate for prolonged period  Ankle dorsiflexion    Ankle plantarflexion    Ankle inversion    Ankle eversion     (Blank rows = not tested)  LOWER EXTREMITY MMT:  MMT Right eval Left eval  Hip flexion 5 3+  Hip extension 4 4  Hip abduction 5 3+  Hip adduction 5 3+  Hip internal rotation    Hip external rotation    Knee flexion 5 4  Knee extension 5 4  Ankle dorsiflexion    Ankle plantarflexion    Ankle inversion    Ankle eversion     (Blank rows = not tested)  LOWER EXTREMITY SPECIAL TESTS:  Pt too tender to attempt much of special testing Pain with valgus and varus knee testing Hypomobile with medial and lateral patellar mobilizations  FUNCTIONAL TESTS:  5 times sit to stand: TBA Timed up and go (TUG): TBA  GAIT: Distance walked: Into clinic Assistive device utilized: None Level of assistance: SBA Comments: Antalgic, diminished stance time on L, decreased knee flexion on L with swing, widened BOS                                                                                                                                TREATMENT DATE:  02/25/24 NuStep: Level 4x 6 min-arms and legs Seated hamstring stretch 3x20 seconds Lt Supine heel slides x10 Hooklying clam with red band x20 ER SLR x6-tactile cues for technique, some pain with this SAQ using ball on Lt 2x10 Rockerboard x2 minutes  Sit to stand x10 Game Ready: 3 snowflakes, medium compression x10 min to Lt knee   02/19/24 See  Hep below Reviewed how to walk with SPC to decrease weight on L LE    PATIENT EDUCATION:  Education details: Exam findings, POC, initial HEP, gait with cane Person educated: Patient Education method: Explanation, Demonstration, and Handouts Education comprehension: verbalized understanding, returned demonstration, and needs further education  HOME EXERCISE PROGRAM: Access Code: [email or web address] URL: [email or web address] Date: 02/25/2024 Prepared by: Tresa Endo  Exercises - Hooklying Isometric Clamshell  - 1 x daily - 7 x weekly - 2 sets - 10 reps - Supine Knee Extension Strengthening  - 1 x daily - 7 x weekly - 2 sets - 10 reps - Straight Leg Raise with External Rotation  - 1 x daily - 7 x weekly - 2 sets - 10 reps - Supine Bridge with Mini Swiss Ball Between Knees  - 1 x daily - 7 x weekly - 2 sets - 10 reps - Seated Hamstring Stretch  - 3 x daily - 7 x weekly - 1 sets - 3 reps - 20 hold - Supine Heel Slide  - 3 x daily - 7 x weekly - 1 sets - 10 reps - 10 hold - Sit to Stand Without Arm Support  - 2 x daily - 7 x weekly - 1 sets - 10 reps  ASSESSMENT:  CLINICAL IMPRESSION: First time follow-up after evaluation.  Pt reports that exercises have helped somewhat but she still has significant pain.  Her Lt knee buckles with steps and she had a fall yesterday.  Pt was able to demonstrate all exercises well today and PT monitored for pain  and technique.  Pt responded well to Game Ready and PT encouraged pt to ice at home.  Patient will benefit from skilled PT to address the below impairments and improve overall function.   OBJECTIVE IMPAIRMENTS: Abnormal gait, decreased activity tolerance, decreased balance, decreased coordination, decreased endurance, decreased mobility, difficulty walking, decreased ROM, decreased strength, increased edema, increased fascial restrictions, increased muscle spasms, improper body mechanics, postural dysfunction, and pain.   ACTIVITY LIMITATIONS: carrying,  lifting, bending, sitting, standing, squatting, stairs, transfers, bathing, toileting, dressing, hygiene/grooming, locomotion level, and caring for others  PARTICIPATION LIMITATIONS: meal prep, cleaning, laundry, driving, shopping, community activity, occupation, and yard work  PERSONAL FACTORS: Age, Fitness, Past/current experiences, and Time since onset of injury/illness/exacerbation are also affecting patient's functional outcome.   REHAB POTENTIAL: Good  CLINICAL DECISION MAKING: Evolving/moderate complexity  EVALUATION COMPLEXITY: Moderate   GOALS: Goals reviewed with patient? Yes  SHORT TERM GOALS: Target date: 03/11/2024  Pt will be ind with initial HEP Baseline: Goal status: INITIAL  2.  Pt will have improved L knee ROM from 0 to 90 deg with tolerable pain Baseline:  Goal status: INITIAL    LONG TERM GOALS: Target date: 04/01/2024   Pt will be ind with management and progression of HEP Baseline:  Goal status: INITIAL  2.  Pt will have improved L knee ROM to 0 - 110 deg for improved stair ascent/descent Baseline:  Goal status: INITIAL  3.  Pt will be able to amb 1000' ind without antalgic gait pattern for community mobility Baseline:  Goal status: INITIAL  4.  Pt will demo 5x STS of </=13 sec for low fall risk Baseline:  Goal status: INITIAL  5.  Pt will have improved LEFS to to >/=38.7% Baseline:  Goal status: INITIAL  6.  Pt will have a TUG of </=13 sec for low fall risk Baseline:  Goal status: INITIAL   PLAN:  PT FREQUENCY: 1-2x/week  PT DURATION: 6 weeks  PLANNED INTERVENTIONS: 97164- PT Re-evaluation, 97110-Therapeutic exercises, 97530- Therapeutic activity, [id number]- Neuromuscular re-education, 97535- Self Care, 16109- Manual therapy, [id number]- Gait training, [phone]- Aquatic Therapy, [phone]- Electrical stimulation (unattended), 97016- Vasopneumatic device, [id number]- Ultrasound, [id number]- Ionotophoresis 4mg /ml Dexamethasone, Balance training, Stair training,  Taping, Dry Needling, Joint mobilization, Cryotherapy, and Moist heat  PLAN FOR NEXT SESSION: Gentle mobility and stability, Game Ready if helpful   Lorrene Reid, PT 02/25/24 10:18 AM

## 2024-02-28 DIAGNOSIS — Z419 Encounter for procedure for purposes other than remedying health state, unspecified: Secondary | ICD-10-CM | POA: Diagnosis not present

## 2024-03-02 ENCOUNTER — Telehealth: Payer: Self-pay | Admitting: Orthopaedic Surgery

## 2024-03-02 NOTE — Telephone Encounter (Signed)
 Received

## 2024-03-02 NOTE — Telephone Encounter (Signed)
 Pt submitted medical release form, Short term disability forms, and $20.00 cash payment. Accepted 03/02/24

## 2024-03-03 ENCOUNTER — Ambulatory Visit

## 2024-03-10 ENCOUNTER — Ambulatory Visit

## 2024-03-15 ENCOUNTER — Encounter (HOSPITAL_BASED_OUTPATIENT_CLINIC_OR_DEPARTMENT_OTHER): Payer: Self-pay | Admitting: Orthopaedic Surgery

## 2024-03-17 ENCOUNTER — Ambulatory Visit (INDEPENDENT_AMBULATORY_CARE_PROVIDER_SITE_OTHER): Admitting: Orthopaedic Surgery

## 2024-03-17 ENCOUNTER — Ambulatory Visit

## 2024-03-17 DIAGNOSIS — M25662 Stiffness of left knee, not elsewhere classified: Secondary | ICD-10-CM | POA: Diagnosis not present

## 2024-03-17 DIAGNOSIS — Z9889 Other specified postprocedural states: Secondary | ICD-10-CM | POA: Diagnosis not present

## 2024-03-17 DIAGNOSIS — M6281 Muscle weakness (generalized): Secondary | ICD-10-CM

## 2024-03-17 DIAGNOSIS — R600 Localized edema: Secondary | ICD-10-CM | POA: Diagnosis not present

## 2024-03-17 DIAGNOSIS — M25562 Pain in left knee: Secondary | ICD-10-CM

## 2024-03-17 DIAGNOSIS — R262 Difficulty in walking, not elsewhere classified: Secondary | ICD-10-CM | POA: Diagnosis not present

## 2024-03-17 NOTE — H&P (View-Only) (Signed)
 Post Operative Evaluation    Procedure/Date of Surgery: Left knee medial meniscal repair 2/13  Interval History:   Presents today 6-week status post above procedure.  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   Past Surgical History:  Procedure Laterality Date   ABDOMINAL HYSTERECTOMY     ABLATION     BIOPSY  08/28/2023   Procedure: BIOPSY;  Surgeon: Luke Salaam, MD;  Location: [facility] ENDOSCOPY;  Service: Gastroenterology;;   CERVICAL BIOPSY  W/ LOOP ELECTRODE EXCISION     CHOLECYSTECTOMY     COLONOSCOPY WITH PROPOFOL  N/A 08/28/2023   Procedure: COLONOSCOPY WITH PROPOFOL ;  Surgeon: Luke Salaam, MD;  Location: [facility] ENDOSCOPY;  Service: Gastroenterology;  Laterality: N/A;   COLPOSCOPY     CYSTOSTOMY W/ BLADDER BIOPSY     ESOPHAGOGASTRODUODENOSCOPY (EGD) WITH PROPOFOL  N/A 08/28/2023   Procedure: ESOPHAGOGASTRODUODENOSCOPY (EGD) WITH PROPOFOL ;  Surgeon: Luke Salaam, MD;  Location: [facility] ENDOSCOPY;  Service: Gastroenterology;  Laterality: N/A;   HEMOSTASIS CLIP PLACEMENT  08/28/2023   Procedure: HEMOSTASIS CLIP PLACEMENT;  Surgeon: Luke Salaam, MD;  Location: [facility] ENDOSCOPY;  Service: Gastroenterology;;   KNEE SURGERY     LEEP     POLYPECTOMY  08/28/2023   Procedure: POLYPECTOMY;  Surgeon: Luke Salaam, MD;  Location: [facility] ENDOSCOPY;  Service: Gastroenterology;;   RHINOPLASTY     TENDON REPAIR Left 07/26/2015   Procedure: REPAIR WITH RECONSTRUCTION LEFT THUMB;  Surgeon: Florida Hurter, MD;  Location: [facility] OR;  Service: Orthopedics;  Laterality: Left;    TONSILLECTOMY     TUBAL LIGATION     Social History   Socioeconomic History   Marital status: Divorced    Spouse name: Not on file   Number of children: 3   Years of education: 15   Highest education level: Not on file  Occupational History   Occupation: Holiday representative  Tobacco Use   Smoking status: Every Day    Current packs/day: 1.00    Average packs/day: 1 pack/day for 25.0 years (25.0 ttl pk-yrs)    Types: Cigarettes   Smokeless tobacco: Never  Vaping Use   Vaping status: Never Used  Substance and Sexual Activity   Alcohol use: Not Currently   Drug use: No   Sexual activity: Yes    Birth control/protection: Surgical  Other Topics Concern   Not on file  Social History Narrative   Fun: Fish and hunt   Denies abuse and feels safe at home.    Social Drivers of Corporate investment banker Strain: Not on file  Food Insecurity: Not on file  Transportation Needs: No Transportation Needs (06/13/2023)   PRAPARE - Administrator, Civil Service (Medical): No    Lack of Transportation (Non-Medical): No  Physical Activity: Not on file  Stress: Not on file  Social Connections: Not on file   Family History  Problem Relation Age  of Onset   Heart disease Mother    COPD Mother    Diabetes Father    Hypertension Father    Stroke Father    Heart disease Father    Breast cancer Maternal Grandmother    Cancer Maternal Grandfather        melanoma   Heart disease Paternal Grandmother    Stroke Paternal Grandmother    Heart attack Paternal Grandmother    Heart disease Paternal Grandfather    Stroke Paternal Grandfather    Heart attack Paternal Grandfather    Allergies  Allergen Reactions   Bee Venom Anaphylaxis   Flexeril [Cyclobenzaprine] Other (See Comments)    Reaction:  Makes pt violent    Lavender Oil Anaphylaxis and Other (See Comments)    Pt states that her lungs and sinuses fill-up with fluid.     Robaxin [Methocarbamol] Other (See Comments)     Reaction:  Makes pt violent    Shellfish Allergy Hives   Amitriptyline Other (See Comments)    Reaction:  Hallucinations    Entex Other (See Comments)    Reaction:  Dizziness    Singulair  [Montelukast  Sodium] Other (See Comments)    Reaction:  Agitation  Pt states that her sinuses fill-up with fluid.     Etodolac Other (See Comments)    Reaction:  Dizziness    Iodine Hives    CT Contrast   Mushroom Extract Complex (Obsolete) Hives   Current Outpatient Medications  Medication Sig Dispense Refill   albuterol  (VENTOLIN  HFA) 108 (90 Base) MCG/ACT inhaler Inhale 1-2 puffs into the lungs every 6 (six) hours as needed. 18 g 0   aspirin  EC 325 MG tablet Take 1 tablet (325 mg total) by mouth daily. 14 tablet 0   bismuth  subsalicylate (PEPTO-BISMOL) 262 MG chewable tablet Chew 2 tablets (524 mg total) by mouth in the morning and at bedtime. 60 tablet 0   colestipol  (COLESTID ) 1 g tablet Take 2 tablets (2 g total) by mouth daily. 60 tablet 5   diphenhydrAMINE  (BENADRYL ) 25 MG tablet Take 50 mg by mouth every 6 (six) hours as needed for itching or allergies.      EPINEPHrine  (EPIPEN  2-PAK) 0.3 mg/0.3 mL IJ SOAJ injection Inject 0.3 mg into the muscle once as needed (for severe allergic reaction).     hyoscyamine  (LEVSIN) 0.125 MG tablet Take 1 tablet (0.125 mg total) by mouth every 4 (four) hours as needed. 90 tablet 5   meloxicam  (MOBIC ) 7.5 MG tablet Take 1 tablet by mouth 2 (two) times daily with a meal.     methylPREDNISolone  (MEDROL  DOSEPAK) 4 MG TBPK tablet Take per packet instructions 1 each 0   omeprazole  (PRILOSEC) 40 MG capsule Take 1 capsule (40 mg total) by mouth daily. 30 capsule 1   ondansetron  (ZOFRAN ) 4 MG tablet Take 1 tablet (4 mg total) by mouth every 8 (eight) hours as needed for nausea or vomiting. 30 tablet 1   oxyCODONE  (ROXICODONE ) 5 MG immediate release tablet Take 1 tablet (5 mg total) by mouth every 4 (four) hours as needed for severe pain (pain score 7-10) or breakthrough  pain. 10 tablet 0   traMADol  (ULTRAM ) 50 MG tablet Take 1 tablet (50 mg total) by mouth every 6 (six) hours as needed for severe pain (pain score 7-10). 30 tablet 0   Wheat Dextrin (BENEFIBER DRINK MIX PO)      No current facility-administered medications for this visit.   No results found.  Review of Systems:  A ROS was performed including pertinent positives and negatives as documented in the HPI.   Musculoskeletal Exam:    Left knee incisions are well-appearing without erythema or drainage.  Motion is from 0 to 130degrees without pain.  Diffuse tenderness about the medial joint line.  Positive McMurray medially  Imaging:      I personally reviewed and interpreted the radiographs.   Assessment:   6-week status post left knee medial meniscal repair with significant pain and inflammation after a quick twisting type mechanism.  Unfortunately she did not get persistent relief after IT band injection and is now having persistent medial based symptoms with positive McMurray.  I am concerned that she may have disrupted the repair and this we will plan to obtain an MRI of the left knee and follow-up discuss results Plan :    - Plan for MRI left knee and follow-up to discuss results     I personally saw and evaluated the patient, and participated in the management and treatment plan.  Wilhelmenia Harada, MD Attending Physician, Orthopedic Surgery  This document was dictated using Dragon voice recognition software. A reasonable attempt at proof reading has been made to minimize errors.

## 2024-03-17 NOTE — Therapy (Signed)
 OUTPATIENT PHYSICAL THERAPY TREATMENT   Patient Name: Brandi Morris MRN: [id number] DOB:1976-10-11, 48 y.o., female Today's Date: 03/17/2024  END OF SESSION:  PT End of Session - 03/17/24 0840     Visit Number 3    Date for PT Re-Evaluation 04/01/24    Authorization Type [employer]-    Authorization Time Period 8 visits 02/19/24-04/19/24    Authorization - Visit Number 2    Authorization - Number of Visits 8    PT Start Time 0800    PT Stop Time 0855    PT Time Calculation (min) 55 min    Activity Tolerance Patient tolerated treatment well    Behavior During Therapy WFL for tasks assessed/performed               Past Medical History:  Diagnosis Date   Asthma    Exercise induced   Endometriosis    Hyperlipidemia    Interstitial cystitis    Kidney stone    Migraine    Multiple allergies    Stroke (HCC)    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   Past Surgical History:  Procedure Laterality Date   ABDOMINAL HYSTERECTOMY     ABLATION     BIOPSY  08/28/2023   Procedure: BIOPSY;  Surgeon: Luke Salaam, MD;  Location: [facility] ENDOSCOPY;  Service: Gastroenterology;;   CERVICAL BIOPSY  W/ LOOP ELECTRODE EXCISION     CHOLECYSTECTOMY     COLONOSCOPY WITH PROPOFOL  N/A 08/28/2023   Procedure: COLONOSCOPY WITH PROPOFOL ;  Surgeon: Luke Salaam, MD;  Location: [facility] ENDOSCOPY;  Service: Gastroenterology;  Laterality: N/A;   COLPOSCOPY     CYSTOSTOMY W/ BLADDER BIOPSY     ESOPHAGOGASTRODUODENOSCOPY (EGD) WITH PROPOFOL  N/A 08/28/2023   Procedure: ESOPHAGOGASTRODUODENOSCOPY (EGD) WITH PROPOFOL ;  Surgeon: Luke Salaam, MD;  Location: [facility] ENDOSCOPY;  Service: Gastroenterology;  Laterality: N/A;   HEMOSTASIS CLIP PLACEMENT  08/28/2023   Procedure: HEMOSTASIS CLIP PLACEMENT;  Surgeon: Luke Salaam, MD;  Location: [facility] ENDOSCOPY;  Service: Gastroenterology;;   KNEE SURGERY     LEEP     POLYPECTOMY  08/28/2023   Procedure: POLYPECTOMY;  Surgeon: Luke Salaam, MD;  Location: [facility]  ENDOSCOPY;  Service: Gastroenterology;;   RHINOPLASTY     TENDON REPAIR Left 07/26/2015   Procedure: REPAIR WITH RECONSTRUCTION LEFT THUMB;  Surgeon: Florida Hurter, MD;  Location: [facility] OR;  Service: Orthopedics;  Laterality: Left;   TONSILLECTOMY     TUBAL LIGATION     Patient Active Problem List   Diagnosis Date Noted   S/P left knee arthroscopy 01/01/24 medial meniscus repair 02/04/2024   Chronic pain of left knee 02/04/2024   Adenomatous polyp of colon 08/28/2023   Diarrhea 07/08/2023   Colon cancer screening 07/08/2023   Anxiety state 07/08/2023   Weight loss, abnormal 04/23/2023   Fever 04/23/2023   Night sweat 04/23/2023   Nausea and vomiting 04/23/2023   S/P cholecystectomy 04/23/2023   S/P hysterectomy 04/23/2023   Localized swelling of left thumb 09/06/2021   Osteoarthritis of carpometacarpal (CMC) joint of thumb 01/04/2021   Pain in thumb joint with movement, left 01/04/2021   Dyspareunia in female 03/23/2019   Chronic pelvic pain in female 03/23/2019   Hyperlipidemia 07/30/2007   Migraine 07/30/2007   ALLERGIC RHINITIS 07/30/2007   Asthma 07/30/2007   INTERSTITIAL CYSTITIS 07/30/2007   INSOMNIA 07/30/2007    PCP: Claudene Crystal, PA-C  REFERRING PROVIDER: Wilhelmenia Harada, MD  REFERRING DIAG:  [phone] (ICD-10-CM) - S/P left knee arthroscopy  THERAPY DIAG:  Acute pain of left knee  Stiffness of left knee, not elsewhere classified  Muscle weakness (generalized)  Localized edema  Difficulty in walking, not elsewhere classified  Rationale for Evaluation and Treatment: Rehabilitation  ONSET DATE: S/P left knee arthroscopy 01/01/24 medial meniscus repair, twisting re injury 1 week after  SUBJECTIVE:   SUBJECTIVE STATEMENT: Lapse in treatment due to caring for her son after an accident.  My leg feels better but the inside and back of my knee feels as bad as it ever has.  My knee locked up 2 weeks. I see the MD today.      From Eval: Pt states it has  not been going well since the knee surgery -- first 9 days were good. Pt has custody of her niece (70 1/2 years old). Reports her niece started to choke and she jumped up out of the recliner and twisted on her knee. Pt states it hasn't started to hurt. Pt reports medial and anterior knee pain. Walking hurts. Pt states she gets a knot on her quad if she's on her feet for 4 hours. Feels that she has re torn her meniscus and pain is back with a vengeance. Only gets relief with leg up on a pillow and slightly bent. Has been trying to work her knee. Pt states she has tried to keep her knee straight. Pt has iced. Injection helped the lateral side of the knee. Injection on the medial knee hurt her a lot. Has tried to wear a brace.   PERTINENT HISTORY: S/P left knee arthroscopy 01/01/24 medial meniscus repair  PAIN: 03/17/24:  Are you having pain? Yes: NPRS scale: 3-6/10 Pain location: L medial knee, anterior knee Pain description: can vary (throbbing but can be sharp and pulling under knee cap) Aggravating factors: walking, stairs are the most difficult Relieving factors: Ibuprofen /tylenol , knee slightly bent , ice  PRECAUTIONS: Fall  RED FLAGS: None   WEIGHT BEARING RESTRICTIONS: No  FALLS:  Has patient fallen in last 6 months? Yes. 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  DIAGNOSTIC FINDINGS: nothing new since her arthroscopy  PATIENT SURVEYS:   Lower Extremity Functional Score: 23 / 80 = 28.7 %  COGNITION: Overall cognitive status: Within functional limits for tasks assessed     SENSATION: WFL  EDEMA:  Mild currently  MUSCLE LENGTH: Did not assess  POSTURE: weight shift right  PALPATION: TTP L patellar tendon, gerdy's tubercle, lateral quad, posterior thigh  LOWER EXTREMITY ROM:  Active ROM Right eval Left eval Left  03/17/24  Hip flexion     Hip extension     Hip abduction     Hip adduction     Hip internal rotation     Hip external rotation     Knee flexion  85 AROM (feels a pain in the back of knee)  85 PROM attempted but empty end feel 90 A/ROM 100 P/ROM  Knee extension  -  15 AROM (pain in front of knee) 0 PROM but unable to tolerate for prolonged period -5 degrees   Ankle dorsiflexion     Ankle plantarflexion     Ankle inversion     Ankle eversion      (Blank rows = not tested)  LOWER EXTREMITY MMT:  MMT Right eval Left eval  Hip flexion 5 3+  Hip extension 4 4  Hip abduction 5 3+  Hip adduction 5 3+  Hip internal rotation    Hip external rotation    Knee flexion 5 4  Knee extension 5 4  Ankle dorsiflexion    Ankle plantarflexion    Ankle inversion    Ankle eversion     (Blank rows = not tested)  LOWER EXTREMITY SPECIAL TESTS:  Pt too tender to attempt much of special testing Pain with valgus and varus knee testing Hypomobile with medial and lateral patellar mobilizations  FUNCTIONAL TESTS:  5 times sit to stand: TBA Timed up and go (TUG): TBA 03/17/24: 5x sit to stand: 15.69 seconds    GAIT: Distance walked: Into clinic Assistive device utilized: None Level of assistance: SBA Comments: Antalgic, diminished stance time on L, decreased knee flexion on L with swing, widened BOS                                                                                                                                TREATMENT DATE:   03/17/24 NuStep: Level 4x 6 min-legs onlys Seated  hamstring stretch 3x20 seconds Lt 4" step ups on Lt 2x10 Weightshifting on balance pad 3 ways x1 min each Hurdles: forward and laeral with step-to with min UE support  Rockerboard x2 minutes-pain with this Sit to stand x20 Short arc quad 5" hold 2x10 Game Ready: 3 snowflakes, medium compression x15 min to Lt knee   02/25/24 NuStep: Level 4x 6 min-arms and legs Seated hamstring stretch 3x20 seconds Lt Supine heel slides x10 Hooklying clam with red band x20 ER SLR x6-tactile cues for technique, some pain with this SAQ using ball on Lt 2x10 Rockerboard x2 minutes  Sit to stand x10 Game Ready: 3 snowflakes, medium compression x10 min to Lt knee   02/19/24 See Hep below Reviewed how to walk with SPC to decrease weight on L LE    PATIENT EDUCATION:  Education details: Exam findings, POC, initial HEP, gait with cane Person educated: Patient Education method: Explanation, Demonstration, and Handouts Education comprehension: verbalized understanding, returned demonstration, and needs further education  HOME EXERCISE PROGRAM: Access Code: [email or web address] URL: [email or web address] Date: 02/25/2024 Prepared by: Loetta Ringer  Exercises - Hooklying Isometric Clamshell  - 1 x daily - 7 x weekly - 2 sets - 10 reps - Supine Knee Extension Strengthening  - 1 x daily - 7 x weekly - 2 sets - 10 reps - Straight Leg Raise with External Rotation  - 1 x daily - 7 x weekly - 2 sets - 10  reps - Supine Bridge with Mini Swiss Ball Between Knees  - 1 x daily - 7 x weekly - 2 sets - 10 reps - Seated Hamstring Stretch  - 3 x daily - 7 x weekly - 1 sets - 3 reps - 20 hold - Supine Heel Slide  - 3 x daily - 7 x weekly - 1 sets - 10 reps - 10 hold - Sit to Stand Without Arm Support  - 2 x daily - 7 x weekly - 1 sets - 10 reps  ASSESSMENT:  CLINICAL IMPRESSION: Pt reports improved function overall although has significant pain in medial and posterior portion of the Lt knee.  She is unable to squat or  kneel and continues to have edema.  She has been consistent with HEP and participated in moderate level exercise in the clinic.  She was able to participate in more standing activity today and had increased pain with this.   PT monitored throughout for pain and technique. Patient will benefit from skilled PT to address the below impairments and improve overall function.   OBJECTIVE IMPAIRMENTS: Abnormal gait, decreased activity tolerance, decreased balance, decreased coordination, decreased endurance, decreased mobility, difficulty walking, decreased ROM, decreased strength, increased edema, increased fascial restrictions, increased muscle spasms, improper body mechanics, postural dysfunction, and pain.   ACTIVITY LIMITATIONS: carrying, lifting, bending, sitting, standing, squatting, stairs, transfers, bathing, toileting, dressing, hygiene/grooming, locomotion level, and caring for others  PARTICIPATION LIMITATIONS: meal prep, cleaning, laundry, driving, shopping, community activity, occupation, and yard work  PERSONAL FACTORS: Age, Fitness, Past/current experiences, and Time since onset of injury/illness/exacerbation are also affecting patient's functional outcome.   REHAB POTENTIAL: Good  CLINICAL DECISION MAKING: Evolving/moderate complexity  EVALUATION COMPLEXITY: Moderate   GOALS: Goals reviewed with patient? Yes  SHORT TERM GOALS: Target date: 03/11/2024  Pt will be ind with initial HEP Baseline: 03/17/24 Goal status: MET  2.  Pt will have improved L knee ROM from 0 to 90 deg with tolerable pain Baseline:  A/ROM 90 with pain, P/ROM 100 (03/17/24) Goal status: partially met     LONG TERM GOALS: Target date: 04/01/2024   Pt will be ind with management and progression of HEP Baseline:  Goal status: INITIAL  2.  Pt will have improved L knee ROM to 0 - 110 deg for improved stair ascent/descent Baseline:  Goal status: INITIAL  3.  Pt will be able to amb 1000' ind without  antalgic gait pattern for community mobility Baseline:  Goal status: INITIAL  4.  Pt will demo 5x STS of </=13 sec for low fall risk Baseline: 15.69 seconds (03/17/24) Goal status: In progress   5.  Pt will have improved LEFS to to >/=38.7% Baseline:  Goal status: INITIAL  6.  Pt will have a TUG of </=13 sec for low fall risk Baseline:  Goal status: INITIAL   PLAN:  PT FREQUENCY: 1-2x/week  PT DURATION: 6 weeks  PLANNED INTERVENTIONS: 97164- PT Re-evaluation, 97110-Therapeutic exercises, 97530- Therapeutic activity, [id number]- Neuromuscular re-education, 97535- Self Care, 09811- Manual therapy, [id number]- Gait training, [phone]- Aquatic Therapy, [id number]- Electrical stimulation (unattended), 97016- Vasopneumatic device, [id number]- Ultrasound, [id number]- Ionotophoresis 4mg /ml Dexamethasone , Balance training, Stair training, Taping, Dry Needling, Joint mobilization, Cryotherapy, and Moist heat  PLAN FOR NEXT SESSION: advance functional mobility as tolerated.  See what MD says today.  Luella Sager, PT 03/17/24 8:42 AM

## 2024-03-17 NOTE — Progress Notes (Signed)
 Post Operative Evaluation    Procedure/Date of Surgery: Left knee medial meniscal repair 2/13  Interval History:   Presents today 6-week status post above procedure.  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   Past Surgical History:  Procedure Laterality Date   ABDOMINAL HYSTERECTOMY     ABLATION     BIOPSY  08/28/2023   Procedure: BIOPSY;  Surgeon: Luke Salaam, MD;  Location: [facility] ENDOSCOPY;  Service: Gastroenterology;;   CERVICAL BIOPSY  W/ LOOP ELECTRODE EXCISION     CHOLECYSTECTOMY     COLONOSCOPY WITH PROPOFOL  N/A 08/28/2023   Procedure: COLONOSCOPY WITH PROPOFOL ;  Surgeon: Luke Salaam, MD;  Location: [facility] ENDOSCOPY;  Service: Gastroenterology;  Laterality: N/A;   COLPOSCOPY     CYSTOSTOMY W/ BLADDER BIOPSY     ESOPHAGOGASTRODUODENOSCOPY (EGD) WITH PROPOFOL  N/A 08/28/2023   Procedure: ESOPHAGOGASTRODUODENOSCOPY (EGD) WITH PROPOFOL ;  Surgeon: Luke Salaam, MD;  Location: [facility] ENDOSCOPY;  Service: Gastroenterology;  Laterality: N/A;   HEMOSTASIS CLIP PLACEMENT  08/28/2023   Procedure: HEMOSTASIS CLIP PLACEMENT;  Surgeon: Luke Salaam, MD;  Location: [facility] ENDOSCOPY;  Service: Gastroenterology;;   KNEE SURGERY     LEEP     POLYPECTOMY  08/28/2023   Procedure: POLYPECTOMY;  Surgeon: Luke Salaam, MD;  Location: [facility] ENDOSCOPY;  Service: Gastroenterology;;   RHINOPLASTY     TENDON REPAIR Left 07/26/2015   Procedure: REPAIR WITH RECONSTRUCTION LEFT THUMB;  Surgeon: Florida Hurter, MD;  Location: [facility] OR;  Service: Orthopedics;  Laterality: Left;    TONSILLECTOMY     TUBAL LIGATION     Social History   Socioeconomic History   Marital status: Divorced    Spouse name: Not on file   Number of children: 3   Years of education: 15   Highest education level: Not on file  Occupational History   Occupation: Holiday representative  Tobacco Use   Smoking status: Every Day    Current packs/day: 1.00    Average packs/day: 1 pack/day for 25.0 years (25.0 ttl pk-yrs)    Types: Cigarettes   Smokeless tobacco: Never  Vaping Use   Vaping status: Never Used  Substance and Sexual Activity   Alcohol use: Not Currently   Drug use: No   Sexual activity: Yes    Birth control/protection: Surgical  Other Topics Concern   Not on file  Social History Narrative   Fun: Fish and hunt   Denies abuse and feels safe at home.    Social Drivers of Corporate investment banker Strain: Not on file  Food Insecurity: Not on file  Transportation Needs: No Transportation Needs (06/13/2023)   PRAPARE - Administrator, Civil Service (Medical): No    Lack of Transportation (Non-Medical): No  Physical Activity: Not on file  Stress: Not on file  Social Connections: Not on file   Family History  Problem Relation Age  of Onset   Heart disease Mother    COPD Mother    Diabetes Father    Hypertension Father    Stroke Father    Heart disease Father    Breast cancer Maternal Grandmother    Cancer Maternal Grandfather        melanoma   Heart disease Paternal Grandmother    Stroke Paternal Grandmother    Heart attack Paternal Grandmother    Heart disease Paternal Grandfather    Stroke Paternal Grandfather    Heart attack Paternal Grandfather    Allergies  Allergen Reactions   Bee Venom Anaphylaxis   Flexeril [Cyclobenzaprine] Other (See Comments)    Reaction:  Makes pt violent    Lavender Oil Anaphylaxis and Other (See Comments)    Pt states that her lungs and sinuses fill-up with fluid.     Robaxin [Methocarbamol] Other (See Comments)     Reaction:  Makes pt violent    Shellfish Allergy Hives   Amitriptyline Other (See Comments)    Reaction:  Hallucinations    Entex Other (See Comments)    Reaction:  Dizziness    Singulair  [Montelukast  Sodium] Other (See Comments)    Reaction:  Agitation  Pt states that her sinuses fill-up with fluid.     Etodolac Other (See Comments)    Reaction:  Dizziness    Iodine Hives    CT Contrast   Mushroom Extract Complex (Obsolete) Hives   Current Outpatient Medications  Medication Sig Dispense Refill   albuterol  (VENTOLIN  HFA) 108 (90 Base) MCG/ACT inhaler Inhale 1-2 puffs into the lungs every 6 (six) hours as needed. 18 g 0   aspirin  EC 325 MG tablet Take 1 tablet (325 mg total) by mouth daily. 14 tablet 0   bismuth  subsalicylate (PEPTO-BISMOL) 262 MG chewable tablet Chew 2 tablets (524 mg total) by mouth in the morning and at bedtime. 60 tablet 0   colestipol  (COLESTID ) 1 g tablet Take 2 tablets (2 g total) by mouth daily. 60 tablet 5   diphenhydrAMINE  (BENADRYL ) 25 MG tablet Take 50 mg by mouth every 6 (six) hours as needed for itching or allergies.      EPINEPHrine  (EPIPEN  2-PAK) 0.3 mg/0.3 mL IJ SOAJ injection Inject 0.3 mg into the muscle once as needed (for severe allergic reaction).     hyoscyamine  (LEVSIN) 0.125 MG tablet Take 1 tablet (0.125 mg total) by mouth every 4 (four) hours as needed. 90 tablet 5   meloxicam  (MOBIC ) 7.5 MG tablet Take 1 tablet by mouth 2 (two) times daily with a meal.     methylPREDNISolone  (MEDROL  DOSEPAK) 4 MG TBPK tablet Take per packet instructions 1 each 0   omeprazole  (PRILOSEC) 40 MG capsule Take 1 capsule (40 mg total) by mouth daily. 30 capsule 1   ondansetron  (ZOFRAN ) 4 MG tablet Take 1 tablet (4 mg total) by mouth every 8 (eight) hours as needed for nausea or vomiting. 30 tablet 1   oxyCODONE  (ROXICODONE ) 5 MG immediate release tablet Take 1 tablet (5 mg total) by mouth every 4 (four) hours as needed for severe pain (pain score 7-10) or breakthrough  pain. 10 tablet 0   traMADol  (ULTRAM ) 50 MG tablet Take 1 tablet (50 mg total) by mouth every 6 (six) hours as needed for severe pain (pain score 7-10). 30 tablet 0   Wheat Dextrin (BENEFIBER DRINK MIX PO)      No current facility-administered medications for this visit.   No results found.  Review of Systems:  A ROS was performed including pertinent positives and negatives as documented in the HPI.   Musculoskeletal Exam:    Left knee incisions are well-appearing without erythema or drainage.  Motion is from 0 to 130degrees without pain.  Diffuse tenderness about the medial joint line.  Positive McMurray medially  Imaging:      I personally reviewed and interpreted the radiographs.   Assessment:   6-week status post left knee medial meniscal repair with significant pain and inflammation after a quick twisting type mechanism.  Unfortunately she did not get persistent relief after IT band injection and is now having persistent medial based symptoms with positive McMurray.  I am concerned that she may have disrupted the repair and this we will plan to obtain an MRI of the left knee and follow-up discuss results Plan :    - Plan for MRI left knee and follow-up to discuss results     I personally saw and evaluated the patient, and participated in the management and treatment plan.  Wilhelmenia Harada, MD Attending Physician, Orthopedic Surgery  This document was dictated using Dragon voice recognition software. A reasonable attempt at proof reading has been made to minimize errors.

## 2024-03-18 ENCOUNTER — Encounter (HOSPITAL_BASED_OUTPATIENT_CLINIC_OR_DEPARTMENT_OTHER): Payer: Self-pay | Admitting: Orthopaedic Surgery

## 2024-03-19 ENCOUNTER — Ambulatory Visit
Admission: RE | Admit: 2024-03-19 | Discharge: 2024-03-19 | Disposition: A | Discharge status: Home / Self Care | Source: Ambulatory Visit | Attending: Orthopaedic Surgery

## 2024-03-19 DIAGNOSIS — Z9889 Other specified postprocedural states: Secondary | ICD-10-CM

## 2024-03-25 ENCOUNTER — Ambulatory Visit (INDEPENDENT_AMBULATORY_CARE_PROVIDER_SITE_OTHER): Admitting: Student

## 2024-03-25 ENCOUNTER — Other Ambulatory Visit (HOSPITAL_BASED_OUTPATIENT_CLINIC_OR_DEPARTMENT_OTHER): Payer: Self-pay

## 2024-03-25 ENCOUNTER — Encounter (HOSPITAL_BASED_OUTPATIENT_CLINIC_OR_DEPARTMENT_OTHER): Payer: Self-pay | Admitting: Student

## 2024-03-25 DIAGNOSIS — M25562 Pain in left knee: Secondary | ICD-10-CM

## 2024-03-25 DIAGNOSIS — Z9889 Other specified postprocedural states: Secondary | ICD-10-CM

## 2024-03-25 MED ORDER — IBUPROFEN 800 MG PO TABS
800.0000 mg | ORAL_TABLET | Freq: Three times a day (TID) | ORAL | 0 refills | Status: AC | PRN
  Filled 2024-03-25: qty 30, 10d supply, fill #0

## 2024-03-25 MED ORDER — ACETAMINOPHEN 500 MG PO TABS
500.0000 mg | ORAL_TABLET | Freq: Four times a day (QID) | ORAL | 0 refills | Status: AC | PRN
  Filled 2024-03-25: qty 30, 8d supply, fill #0

## 2024-03-25 MED ORDER — ASPIRIN 325 MG PO TBEC
325.0000 mg | DELAYED_RELEASE_TABLET | Freq: Every day | ORAL | 0 refills | Status: DC
  Filled 2024-03-25: qty 14, 14d supply, fill #0

## 2024-03-25 NOTE — Progress Notes (Signed)
 Post Operative Evaluation    Procedure/Date of Surgery: Left knee medial meniscal repair 2/13  Interval History:   Patient presents today almost 3 months status post left knee medial meniscal repair.  She did report a twisting injury a few weeks ago, and since then her knee has continued to worsen and become more painful.  She is unable to stand or walk for any significant period of time.  Pain levels often become severe and are preventing her from sleeping.  She does get some temporary relief with Tylenol  and ibuprofen .  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   Past Surgical History:  Procedure Laterality Date   ABDOMINAL HYSTERECTOMY     ABLATION     BIOPSY  08/28/2023   Procedure: BIOPSY;  Surgeon: Luke Salaam, MD;  Location: [facility] ENDOSCOPY;  Service: Gastroenterology;;   CERVICAL BIOPSY  W/ LOOP ELECTRODE EXCISION     CHOLECYSTECTOMY     COLONOSCOPY WITH PROPOFOL  N/A 08/28/2023   Procedure: COLONOSCOPY WITH PROPOFOL ;  Surgeon: Luke Salaam, MD;  Location: [facility] ENDOSCOPY;  Service: Gastroenterology;  Laterality: N/A;   COLPOSCOPY     CYSTOSTOMY W/ BLADDER BIOPSY     ESOPHAGOGASTRODUODENOSCOPY (EGD) WITH PROPOFOL  N/A 08/28/2023   Procedure: ESOPHAGOGASTRODUODENOSCOPY (EGD) WITH PROPOFOL ;  Surgeon: Luke Salaam, MD;  Location: [facility] ENDOSCOPY;  Service: Gastroenterology;  Laterality: N/A;   HEMOSTASIS CLIP PLACEMENT  08/28/2023   Procedure: HEMOSTASIS CLIP PLACEMENT;  Surgeon: Luke Salaam, MD;  Location: [facility] ENDOSCOPY;  Service: Gastroenterology;;   KNEE SURGERY     LEEP     POLYPECTOMY  08/28/2023   Procedure: POLYPECTOMY;  Surgeon: Luke Salaam, MD;  Location: [facility] ENDOSCOPY;  Service:  Gastroenterology;;   RHINOPLASTY     TENDON REPAIR Left 07/26/2015   Procedure: REPAIR WITH RECONSTRUCTION LEFT THUMB;  Surgeon: Florida Hurter, MD;  Location: [facility] OR;  Service: Orthopedics;  Laterality: Left;   TONSILLECTOMY     TUBAL LIGATION     Social History   Socioeconomic History   Marital status: Divorced    Spouse name: Not on file   Number of children: 3   Years of education: 15   Highest education level: Not on file  Occupational History   Occupation: Holiday representative  Tobacco Use   Smoking status: Every Day    Current packs/day: 1.00    Average packs/day: 1 pack/day for 25.0 years (25.0 ttl pk-yrs)    Types: Cigarettes   Smokeless tobacco: Never  Vaping Use   Vaping status: Never Used  Substance and Sexual Activity   Alcohol use: Not Currently   Drug use: No   Sexual activity: Yes    Birth control/protection: Surgical  Other Topics Concern   Not on file  Social History Narrative   Fun: Fish and hunt   Denies abuse and feels safe at home.    Social Drivers of Corporate investment banker Strain: Not on file  Food Insecurity: Not on file  Transportation Needs: No Transportation Needs (06/13/2023)   PRAPARE -  Administrator, Civil Service (Medical): No    Lack of Transportation (Non-Medical): No  Physical Activity: Not on file  Stress: Not on file  Social Connections: Not on file   Family History  Problem Relation Age of Onset   Heart disease Mother    COPD Mother    Diabetes Father    Hypertension Father    Stroke Father    Heart disease Father    Breast cancer Maternal Grandmother    Cancer Maternal Grandfather        melanoma   Heart disease Paternal Grandmother    Stroke Paternal Grandmother    Heart attack Paternal Grandmother    Heart disease Paternal Grandfather    Stroke Paternal Grandfather    Heart attack Paternal Grandfather    Allergies  Allergen Reactions   Bee Venom Anaphylaxis   Flexeril [Cyclobenzaprine] Other  (See Comments)    Reaction:  Makes pt violent    Lavender Oil Anaphylaxis and Other (See Comments)    Pt states that her lungs and sinuses fill-up with fluid.     Robaxin [Methocarbamol] Other (See Comments)    Reaction:  Makes pt violent    Shellfish Allergy Hives   Amitriptyline Other (See Comments)    Reaction:  Hallucinations    Entex Other (See Comments)    Reaction:  Dizziness    Singulair  [Montelukast  Sodium] Other (See Comments)    Reaction:  Agitation  Pt states that her sinuses fill-up with fluid.     Etodolac Other (See Comments)    Reaction:  Dizziness    Iodine Hives    CT Contrast   Mushroom Extract Complex (Obsolete) Hives   Current Outpatient Medications  Medication Sig Dispense Refill   albuterol  (VENTOLIN  HFA) 108 (90 Base) MCG/ACT inhaler Inhale 1-2 puffs into the lungs every 6 (six) hours as needed. 18 g 0   aspirin  EC 325 MG tablet Take 1 tablet (325 mg total) by mouth daily. 14 tablet 0   bismuth  subsalicylate (PEPTO-BISMOL) 262 MG chewable tablet Chew 2 tablets (524 mg total) by mouth in the morning and at bedtime. 60 tablet 0   colestipol  (COLESTID ) 1 g tablet Take 2 tablets (2 g total) by mouth daily. 60 tablet 5   diphenhydrAMINE  (BENADRYL ) 25 MG tablet Take 50 mg by mouth every 6 (six) hours as needed for itching or allergies.      EPINEPHrine  (EPIPEN  2-PAK) 0.3 mg/0.3 mL IJ SOAJ injection Inject 0.3 mg into the muscle once as needed (for severe allergic reaction).     hyoscyamine  (LEVSIN) 0.125 MG tablet Take 1 tablet (0.125 mg total) by mouth every 4 (four) hours as needed. 90 tablet 5   meloxicam  (MOBIC ) 7.5 MG tablet Take 1 tablet by mouth 2 (two) times daily with a meal.     methylPREDNISolone  (MEDROL  DOSEPAK) 4 MG TBPK tablet Take per packet instructions 1 each 0   omeprazole  (PRILOSEC) 40 MG capsule Take 1 capsule (40 mg total) by mouth daily. 30 capsule 1   ondansetron  (ZOFRAN ) 4 MG tablet Take 1 tablet (4 mg total) by mouth every 8 (eight) hours as  needed for nausea or vomiting. 30 tablet 1   oxyCODONE  (ROXICODONE ) 5 MG immediate release tablet Take 1 tablet (5 mg total) by mouth every 4 (four) hours as needed for severe pain (pain score 7-10) or breakthrough pain. 10 tablet 0   traMADol  (ULTRAM ) 50 MG tablet Take 1 tablet (50 mg total) by mouth every 6 (  six) hours as needed for severe pain (pain score 7-10). 30 tablet 0   Wheat Dextrin (BENEFIBER DRINK MIX PO)      No current facility-administered medications for this visit.   No results found.  Review of Systems:   A ROS was performed including pertinent positives and negatives as documented in the HPI.   Musculoskeletal Exam:    Left knee incisions are well-appearing without evidence of erythema or drainage.  Tenderness over the medial joint line with positive McMurray.  Active range of motion from 5 to 120 degrees.  Imaging:    MRI left knee: Small tear within the posterior horn of the medial meniscus with medial meniscal extrusion    I personally reviewed and interpreted the radiographs.   Assessment:   Patient is almost 3 months status post left knee medial meniscal repair.  Unfortunately she did have a twisting injury approximately 1 month ago, and is demonstrating worsening medial based pain that is affecting all of her ADLs.  She is tender over the medial joint line with a positive McMurray, and MRI reviewed today does show meniscal extrusion with a small tear.  This was reviewed with Dr. Hermina Loosen, and he has ultimately recommended a repeat procedure for meniscal centralization.  I discussed this with the patient as well as expected recovery, risks, and benefits, and patient would like to proceed with the procedure.  Will plan to get this scheduled and I have sent Tylenol , ibuprofen , and aspirin  to the pharmacy for postop.  All other questions addressed at this time.  Plan :    - Plan left knee medial meniscal centralization with Dr. Hermina Loosen - Postop meds sent to  pharmacy     I personally saw and evaluated the patient, and participated in the management and treatment plan.   Sharrell Deck, PA-C Orthopedics

## 2024-03-29 DIAGNOSIS — Z419 Encounter for procedure for purposes other than remedying health state, unspecified: Secondary | ICD-10-CM | POA: Diagnosis not present

## 2024-03-30 ENCOUNTER — Other Ambulatory Visit: Payer: Self-pay

## 2024-03-30 ENCOUNTER — Encounter (HOSPITAL_BASED_OUTPATIENT_CLINIC_OR_DEPARTMENT_OTHER): Payer: Self-pay | Admitting: Orthopaedic Surgery

## 2024-03-31 ENCOUNTER — Other Ambulatory Visit (HOSPITAL_BASED_OUTPATIENT_CLINIC_OR_DEPARTMENT_OTHER): Payer: Self-pay | Admitting: Orthopaedic Surgery

## 2024-03-31 DIAGNOSIS — Z9889 Other specified postprocedural states: Secondary | ICD-10-CM

## 2024-04-05 ENCOUNTER — Ambulatory Visit (HOSPITAL_BASED_OUTPATIENT_CLINIC_OR_DEPARTMENT_OTHER): Payer: Self-pay | Admitting: Orthopaedic Surgery

## 2024-04-05 DIAGNOSIS — Z9889 Other specified postprocedural states: Secondary | ICD-10-CM

## 2024-04-06 ENCOUNTER — Encounter (HOSPITAL_BASED_OUTPATIENT_CLINIC_OR_DEPARTMENT_OTHER)
Admission: RE | Disposition: A | Discharge status: Home / Self Care | Payer: Self-pay | Source: Home / Self Care | Attending: Orthopaedic Surgery

## 2024-04-06 ENCOUNTER — Other Ambulatory Visit: Payer: Self-pay

## 2024-04-06 ENCOUNTER — Ambulatory Visit (HOSPITAL_BASED_OUTPATIENT_CLINIC_OR_DEPARTMENT_OTHER): Payer: Self-pay | Admitting: Anesthesiology

## 2024-04-06 ENCOUNTER — Encounter (HOSPITAL_BASED_OUTPATIENT_CLINIC_OR_DEPARTMENT_OTHER): Payer: Self-pay | Admitting: Orthopaedic Surgery

## 2024-04-06 ENCOUNTER — Ambulatory Visit (HOSPITAL_BASED_OUTPATIENT_CLINIC_OR_DEPARTMENT_OTHER)
Admission: RE | Admit: 2024-04-06 | Discharge: 2024-04-06 | Disposition: A | Discharge status: Home / Self Care | Attending: Orthopaedic Surgery | Admitting: Orthopaedic Surgery

## 2024-04-06 DIAGNOSIS — S83242A Other tear of medial meniscus, current injury, left knee, initial encounter: Secondary | ICD-10-CM | POA: Insufficient documentation

## 2024-04-06 DIAGNOSIS — S83272A Complex tear of lateral meniscus, current injury, left knee, initial encounter: Secondary | ICD-10-CM

## 2024-04-06 DIAGNOSIS — K219 Gastro-esophageal reflux disease without esophagitis: Secondary | ICD-10-CM | POA: Insufficient documentation

## 2024-04-06 DIAGNOSIS — X501XXA Overexertion from prolonged static or awkward postures, initial encounter: Secondary | ICD-10-CM | POA: Diagnosis not present

## 2024-04-06 DIAGNOSIS — F17210 Nicotine dependence, cigarettes, uncomplicated: Secondary | ICD-10-CM | POA: Insufficient documentation

## 2024-04-06 DIAGNOSIS — Z9889 Other specified postprocedural states: Secondary | ICD-10-CM

## 2024-04-06 DIAGNOSIS — S83242D Other tear of medial meniscus, current injury, left knee, subsequent encounter: Secondary | ICD-10-CM | POA: Diagnosis not present

## 2024-04-06 DIAGNOSIS — Z8673 Personal history of transient ischemic attack (TIA), and cerebral infarction without residual deficits: Secondary | ICD-10-CM | POA: Diagnosis not present

## 2024-04-06 HISTORY — DX: Family history of other specified conditions: Z84.89

## 2024-04-06 HISTORY — PX: KNEE ARTHROSCOPY WITH MENISCAL REPAIR: SHX5653

## 2024-04-06 SURGERY — ARTHROSCOPY, KNEE, WITH MENISCUS REPAIR
Anesthesia: General | Site: Knee | Laterality: Left

## 2024-04-06 MED ORDER — CEFAZOLIN SODIUM-DEXTROSE 2-4 GM/100ML-% IV SOLN
2.0000 g | INTRAVENOUS | Status: AC
  Administered 2024-04-06: 2 g via INTRAVENOUS

## 2024-04-06 MED ORDER — OXYCODONE HCL 5 MG PO TABS: 5.0000 mg | ORAL_TABLET | ORAL | 0 refills | Status: DC | PRN

## 2024-04-06 MED ORDER — MIDAZOLAM HCL 5 MG/5ML IJ SOLN
INTRAMUSCULAR | Status: DC | PRN
Start: 2024-04-06 — End: 2024-04-06
  Administered 2024-04-06: 2 mg via INTRAVENOUS

## 2024-04-06 MED ORDER — OXYCODONE HCL 5 MG/5ML PO SOLN: 5.0000 mg | Freq: Once | ORAL | Status: AC | PRN

## 2024-04-06 MED ORDER — TRANEXAMIC ACID-NACL 1000-0.7 MG/100ML-% IV SOLN
INTRAVENOUS | Status: AC
  Filled 2024-04-06: qty 100

## 2024-04-06 MED ORDER — ONDANSETRON HCL 4 MG/2ML IJ SOLN
INTRAMUSCULAR | Status: DC | PRN
  Administered 2024-04-06: 4 mg via INTRAVENOUS

## 2024-04-06 MED ORDER — FENTANYL CITRATE (PF) 100 MCG/2ML IJ SOLN
25.0000 ug | INTRAMUSCULAR | Status: DC | PRN
  Administered 2024-04-06 (×2): 50 ug via INTRAVENOUS

## 2024-04-06 MED ORDER — PROPOFOL 10 MG/ML IV BOLUS
INTRAVENOUS | Status: DC | PRN
  Administered 2024-04-06: 200 mg via INTRAVENOUS

## 2024-04-06 MED ORDER — OXYCODONE HCL 5 MG PO TABS
ORAL_TABLET | ORAL | Status: AC
  Filled 2024-04-06: qty 1

## 2024-04-06 MED ORDER — TRANEXAMIC ACID-NACL 1000-0.7 MG/100ML-% IV SOLN
1000.0000 mg | INTRAVENOUS | Status: AC
  Administered 2024-04-06: 1000 mg via INTRAVENOUS

## 2024-04-06 MED ORDER — KETOROLAC TROMETHAMINE 30 MG/ML IJ SOLN
INTRAMUSCULAR | Status: DC | PRN
Start: 2024-04-06 — End: 2024-04-06
  Administered 2024-04-06: 30 mg via INTRAVENOUS

## 2024-04-06 MED ORDER — PROPOFOL 500 MG/50ML IV EMUL
INTRAVENOUS | Status: AC
  Filled 2024-04-06: qty 50

## 2024-04-06 MED ORDER — DEXAMETHASONE SODIUM PHOSPHATE 10 MG/ML IJ SOLN
INTRAMUSCULAR | Status: AC
  Filled 2024-04-06: qty 1

## 2024-04-06 MED ORDER — MIDAZOLAM HCL 2 MG/2ML IJ SOLN
INTRAMUSCULAR | Status: AC
  Filled 2024-04-06: qty 2

## 2024-04-06 MED ORDER — FENTANYL CITRATE (PF) 100 MCG/2ML IJ SOLN
INTRAMUSCULAR | Status: AC
Start: 2024-04-06 — End: ?
  Filled 2024-04-06: qty 2

## 2024-04-06 MED ORDER — PROPOFOL 10 MG/ML IV BOLUS
INTRAVENOUS | Status: AC
  Filled 2024-04-06: qty 40

## 2024-04-06 MED ORDER — ONDANSETRON HCL 4 MG/2ML IJ SOLN
INTRAMUSCULAR | Status: AC
  Filled 2024-04-06: qty 2

## 2024-04-06 MED ORDER — AMISULPRIDE (ANTIEMETIC) 5 MG/2ML IV SOLN: 10.0000 mg | Freq: Once | INTRAVENOUS | Status: DC | PRN

## 2024-04-06 MED ORDER — FENTANYL CITRATE (PF) 100 MCG/2ML IJ SOLN
INTRAMUSCULAR | Status: AC
  Filled 2024-04-06: qty 2

## 2024-04-06 MED ORDER — LIDOCAINE 2% (20 MG/ML) 5 ML SYRINGE
INTRAMUSCULAR | Status: DC | PRN
  Administered 2024-04-06: 100 mg via INTRAVENOUS

## 2024-04-06 MED ORDER — FENTANYL CITRATE (PF) 100 MCG/2ML IJ SOLN
INTRAMUSCULAR | Status: DC | PRN
  Administered 2024-04-06 (×2): 50 ug via INTRAVENOUS

## 2024-04-06 MED ORDER — SODIUM CHLORIDE 0.9 % IR SOLN
Status: DC | PRN
  Administered 2024-04-06: 6000 mL

## 2024-04-06 MED ORDER — KETOROLAC TROMETHAMINE 30 MG/ML IJ SOLN
INTRAMUSCULAR | Status: AC
  Filled 2024-04-06: qty 1

## 2024-04-06 MED ORDER — LIDOCAINE 2% (20 MG/ML) 5 ML SYRINGE
INTRAMUSCULAR | Status: AC
  Filled 2024-04-06: qty 5

## 2024-04-06 MED ORDER — OXYCODONE HCL 5 MG PO TABS
5.0000 mg | ORAL_TABLET | Freq: Once | ORAL | Status: AC | PRN
  Administered 2024-04-06: 5 mg via ORAL

## 2024-04-06 MED ORDER — DEXAMETHASONE SODIUM PHOSPHATE 10 MG/ML IJ SOLN
INTRAMUSCULAR | Status: DC | PRN
  Administered 2024-04-06: 10 mg via INTRAVENOUS

## 2024-04-06 MED ORDER — ACETAMINOPHEN 500 MG PO TABS
1000.0000 mg | ORAL_TABLET | Freq: Once | ORAL | Status: AC
  Administered 2024-04-06: 1000 mg via ORAL

## 2024-04-06 MED ORDER — BUPIVACAINE HCL (PF) 0.25 % IJ SOLN
INTRAMUSCULAR | Status: DC | PRN
  Administered 2024-04-06: 20 mL

## 2024-04-06 MED ORDER — CEFAZOLIN SODIUM-DEXTROSE 2-4 GM/100ML-% IV SOLN
INTRAVENOUS | Status: AC
  Filled 2024-04-06: qty 100

## 2024-04-06 MED ORDER — GABAPENTIN 300 MG PO CAPS
ORAL_CAPSULE | ORAL | Status: AC
  Filled 2024-04-06: qty 1

## 2024-04-06 MED ORDER — GABAPENTIN 300 MG PO CAPS
300.0000 mg | ORAL_CAPSULE | Freq: Once | ORAL | Status: AC
  Administered 2024-04-06: 300 mg via ORAL

## 2024-04-06 MED ORDER — LACTATED RINGERS IV SOLN: INTRAVENOUS | Status: DC

## 2024-04-06 MED ORDER — ACETAMINOPHEN 500 MG PO TABS
ORAL_TABLET | ORAL | Status: AC
  Filled 2024-04-06: qty 2

## 2024-04-06 SURGICAL SUPPLY — 45 items
ANCHOR SUT 1.8 FIBERTAK SB KL (Anchor) IMPLANT
BLADE EXCALIBUR 4.0X13 (MISCELLANEOUS) IMPLANT
BLADE SURG 15 STRL LF DISP TIS (BLADE) IMPLANT
BNDG ELASTIC 4INX 5YD STR LF (GAUZE/BANDAGES/DRESSINGS) ×2 IMPLANT
BNDG ELASTIC 6INX 5YD STR LF (GAUZE/BANDAGES/DRESSINGS) ×2 IMPLANT
CHLORAPREP W/TINT 26 (MISCELLANEOUS) ×2 IMPLANT
COOLER ICEMAN CLASSIC (MISCELLANEOUS) ×2 IMPLANT
DISSECTOR 3.8MM X 13CM (MISCELLANEOUS) ×2 IMPLANT
DRAPE IMP U-DRAPE 54X76 (DRAPES) IMPLANT
DRAPE INCISE IOBAN 66X45 STRL (DRAPES) IMPLANT
DRAPE U-SHAPE 47X51 STRL (DRAPES) ×2 IMPLANT
DRAPE-T ARTHROSCOPY W/POUCH (DRAPES) ×2 IMPLANT
DW OUTFLOW CASSETTE/TUBE SET (MISCELLANEOUS) IMPLANT
ELECTRODE REM PT RTRN 9FT ADLT (ELECTROSURGICAL) IMPLANT
EXCALIBUR 3.8MM X 13CM (MISCELLANEOUS) IMPLANT
GAUZE 4X4 16PLY ~~LOC~~+RFID DBL (SPONGE) IMPLANT
GAUZE PAD ABD 8X10 STRL (GAUZE/BANDAGES/DRESSINGS) ×2 IMPLANT
GAUZE SPONGE 4X4 12PLY STRL (GAUZE/BANDAGES/DRESSINGS) ×2 IMPLANT
GAUZE XEROFORM 1X8 LF (GAUZE/BANDAGES/DRESSINGS) ×2 IMPLANT
GLOVE BIO SURGEON STRL SZ 6 (GLOVE) ×2 IMPLANT
GLOVE BIO SURGEON STRL SZ7.5 (GLOVE) ×2 IMPLANT
GLOVE BIOGEL PI IND STRL 6.5 (GLOVE) ×2 IMPLANT
GLOVE BIOGEL PI IND STRL 8 (GLOVE) ×2 IMPLANT
GOWN STRL REUS W/ TWL LRG LVL3 (GOWN DISPOSABLE) ×4 IMPLANT
GOWN STRL REUS W/ TWL XL LVL3 (GOWN DISPOSABLE) ×2 IMPLANT
KIT STR SPEAR 1.8 FBRTK DISP (KITS) IMPLANT
LASSO 90 CVE QUICKPAS (DISPOSABLE) IMPLANT
LOOP 2 FIBERLINK CLOSED (SUTURE) IMPLANT
MANIFOLD NEPTUNE II (INSTRUMENTS) ×2 IMPLANT
NDL SAFETY ECLIPSE 18X1.5 (NEEDLE) ×2 IMPLANT
NDL SUT 2-0 SCORPION KNEE (NEEDLE) IMPLANT
NEEDLE SUT 2-0 SCORPION KNEE (NEEDLE) IMPLANT
PACK ARTHROSCOPY DSU (CUSTOM PROCEDURE TRAY) ×2 IMPLANT
PACK BASIN DAY SURGERY FS (CUSTOM PROCEDURE TRAY) ×2 IMPLANT
PAD COLD SHLDR WRAP-ON (PAD) ×2 IMPLANT
PADDING CAST COTTON 6X4 STRL (CAST SUPPLIES) ×2 IMPLANT
PENCIL SMOKE EVACUATOR (MISCELLANEOUS) IMPLANT
SLEEVE SCD COMPRESS KNEE MED (STOCKING) ×2 IMPLANT
SUCTION TUBE FRAZIER 10FR DISP (SUCTIONS) ×2 IMPLANT
SUT ETHILON 3 0 PS 1 (SUTURE) ×2 IMPLANT
SUT VIC AB 0 CT1 27XBRD ANBCTR (SUTURE) IMPLANT
SUT VIC AB 2-0 CT1 TAPERPNT 27 (SUTURE) IMPLANT
TOWEL GREEN STERILE FF (TOWEL DISPOSABLE) ×4 IMPLANT
TUBING ARTHROSCOPY IRRIG 16FT (MISCELLANEOUS) ×2 IMPLANT
WAND ABLATOR APOLLO I90 (BUR) ×2 IMPLANT

## 2024-04-06 NOTE — Interval H&P Note (Signed)
 History and Physical Interval Note:  04/06/2024 11:17 AM  Brandi Morris  has presented today for surgery, with the diagnosis of LEFT KNEE MEDIAL MENISCUS TEAR.  The various methods of treatment have been discussed with the patient and family. After consideration of risks, benefits and other options for treatment, the patient has consented to  Procedure(s): ARTHROSCOPY, KNEE, WITH MENISCUS REPAIR (Left) as a surgical intervention.  The patient's history has been reviewed, patient examined, no change in status, stable for surgery.  I have reviewed the patient's chart and labs.  Questions were answered to the patient's satisfaction.     Trinka Keshishyan

## 2024-04-06 NOTE — Anesthesia Postprocedure Evaluation (Signed)
 Anesthesia Post Note  Patient: Brandi Morris  Procedure(s) Performed: ARTHROSCOPY, KNEE, WITH MENISCUS REPAIR (Left: Knee)     Patient location during evaluation: PACU Anesthesia Type: General Level of consciousness: awake Pain management: pain level controlled Vital Signs Assessment: post-procedure vital signs reviewed and stable Respiratory status: spontaneous breathing, nonlabored ventilation and respiratory function stable Cardiovascular status: blood pressure returned to baseline and stable Postop Assessment: no apparent nausea or vomiting Anesthetic complications: no   No notable events documented.  Last Vitals:  Vitals:   04/06/24 1300 04/06/24 1316  BP: 123/78 130/77  Pulse: (!) 59   Resp: 15 16  Temp:  36.7 C  SpO2: 96% 98%    Last Pain:  Vitals:   04/06/24 1316  PainSc: 3                  Conard Decent

## 2024-04-06 NOTE — Anesthesia Procedure Notes (Signed)
 Procedure Name: LMA Insertion Date/Time: 04/06/2024 11:32 AM  Performed by: Junius Olive, CRNAPre-anesthesia Checklist: Patient identified, Emergency Drugs available, Suction available and Patient being monitored Patient Re-evaluated:Patient Re-evaluated prior to induction Oxygen Delivery Method: Circle system utilized Preoxygenation: Pre-oxygenation with 100% oxygen Induction Type: IV induction Ventilation: Mask ventilation without difficulty LMA: LMA inserted LMA Size: 4.0 Number of attempts: 1 Airway Equipment and Method: Bite block Placement Confirmation: positive ETCO2 Tube secured with: Tape Dental Injury: Teeth and Oropharynx as per pre-operative assessment

## 2024-04-06 NOTE — Op Note (Signed)
   Date of Surgery: 04/06/2024  INDICATIONS: Ms. Vanduyn is a 48 y.o.-year-old female with left knee medial meniscal tear.  The risk and benefits of the procedure were discussed in detail and documented in the pre-operative evaluation.   PREOPERATIVE DIAGNOSIS: 1.  Left knee medial meniscal tear recurrent  POSTOPERATIVE DIAGNOSIS: Same.  PROCEDURE: 1.  Left knee medial meniscal repair  SURGEON: Carmina Chris MD  ASSISTANT: Deon Flatter, ATC  ANESTHESIA:  general  IV FLUIDS AND URINE: See anesthesia record.  ANTIBIOTICS: Ancef   ESTIMATED BLOOD LOSS: 5 mL.  IMPLANTS:  Implant Name Type Inv. Item Serial No. Manufacturer Lot No. LRB No. Used Action  ANCHOR SUT 1.8 FIBERTAK SB KL - [id number] Anchor ANCHOR SUT 1.8 FIBERTAK SB KL  [employer] [id number] Left 1 Implanted    DRAINS: None  CULTURES: None  COMPLICATIONS: none  DESCRIPTION OF PROCEDURE:   I identified the patient in the pre-operative holding area.  I marked the operative knee with my initials. I reviewed the risks and benefits of the proposed surgical intervention and the patient wished to proceed.  Anesthesia performed a peripheral nerve block.  Patient was subsequently taken back to the operating room.  The patient was transferred to the operative suite and placed in the supine position with all bony prominences padded.     SCDs were placed on the non-operative lower extremity. Appropriate antibiotics was administered within 1 hour before incision. The operative lower extremity was then prepped and draped in standard fashion. A time out was performed confirming the correct extremity, correct patient and correct procedure.    A standard anterolateral portal was made with an 11 blade.  The ideal position for the anteromedial portal was established using a spinal needle.  This AM portal was then created under direct visualization with an 11 blade.  A full diagnostic arthroscopy was then performed, as described above,  including probing of the chondral and meniscal surfaces.     At this time attention was turned to the medial meniscus.  Unfortunately there was an anchor that had dislodged which had caused some stripe wear and softening but no significant cartilage loss.  The suture was removed with a grasper.  At this time there was persistent medial meniscal extrusion into the gutter and as result decision was made to perform centralization.  Anchor was introduced via the proximal portion of the medial portal just at the medial margin of the body of the medial meniscus.  The anchor was placed.  The universal lasso was then introduced into this portal and placed around the meniscocapsular junction.  This was used to pass the tissue suture.  This was fed into the knotless anchor and subsequently tensioned with excellent restoration into the joint space.  That concluded the case.  Skin was closed with 3-0 nylon. Xeroform gauze, gauze, Tegaderm, Iceman and brace were applied.  Instrument, sponge, and needle counts were correct prior to wound closure and at the conclusion of the case.  The patient was taken to the PACU without complication     POSTOPERATIVE PLAN: Will be weightbearing and activity as tolerated I will plan to see her back in 2 weeks for suture removal.  She will be seen immediately in physical therapy and allowed range of motion and weightbearing as tolerated  Carmina Chris, MD 12:12 PM

## 2024-04-06 NOTE — Transfer of Care (Signed)
 Immediate Anesthesia Transfer of Care Note  Patient: Brandi Morris  Procedure(s) Performed: ARTHROSCOPY, KNEE, WITH MENISCUS REPAIR (Left: Knee)  Patient Location: PACU  Anesthesia Type:General  Level of Consciousness: drowsy  Airway & Oxygen Therapy: Patient Spontanous Breathing and Patient connected to face mask oxygen  Post-op Assessment: Report given to RN and Post -op Vital signs reviewed and stable  Post vital signs: Reviewed and stable  Last Vitals:  Vitals Value Taken Time  BP 123/87 (100)   Temp    Pulse 75 04/06/24 1222  Resp 13 04/06/24 1222  SpO2 100 % 04/06/24 1222  Vitals shown include unfiled device data.  Last Pain:  Vitals:   04/06/24 0858  PainSc: 4       Patients Stated Pain Goal: 5 (04/06/24 0858)  Complications: No notable events documented.

## 2024-04-06 NOTE — Discharge Instructions (Addendum)
 Discharge Instructions    Attending Surgeon: Wilhelmenia Harada, MD Office Phone Number: [phone]   Diagnosis and Procedures:    Surgeries Performed: Left knee medial meniscal revision repair  Discharge Plan:    Diet: Resume usual diet. Begin with light or bland foods.  Drink plenty of fluids.  Activity:  Weightbearing and activity as tolerated left leg. You are advised to go home directly from the hospital or surgical center. Restrict your activities.  GENERAL INSTRUCTIONS: 1.  Please apply ice to your wound to help with swelling and inflammation. This will improve your comfort and your overall recovery following surgery.     2. Please call Dr. Verline Glow office at [phone] with questions Monday-Friday during business hours. If no one answers, please leave a message and someone should get back to the patient within 24 hours. For emergencies please call 911 or proceed to the emergency room.   3. Patient to notify surgical team if experiences any of the following: Bowel/Bladder dysfunction, uncontrolled pain, nerve/muscle weakness, incision with increased drainage or redness, nausea/vomiting and Fever greater than 101.0 F.  Be alert for signs of infection including redness, streaking, odor, fever or chills. Be alert for excessive pain or bleeding and notify your surgeon immediately.  WOUND INSTRUCTIONS:   Leave your dressing, cast, or splint in place until your post operative visit.  Keep it clean and dry.  Always keep the incision clean and dry until the staples/sutures are removed. If there is no drainage from the incision you should keep it open to air. If there is drainage from the incision you must keep it covered at all times until the drainage stops  Do not soak in a bath tub, hot tub, pool, lake or other body of water until 21 days after your surgery and your incision is completely dry and healed.  If you have removable sutures (or staples) they must be removed  10-14 days (unless otherwise instructed) from the day of your surgery.     1)  Elevate the extremity as much as possible.  2)  Keep the dressing clean and dry.  3)  Please call us  if the dressing becomes wet or dirty.  4)  If you are experiencing worsening pain or worsening swelling, please call.     MEDICATIONS: Resume all previous home medications at the previous prescribed dose and frequency unless otherwise noted Start taking the  pain medications on an as-needed basis as prescribed  Please taper down pain medication over the next week following surgery.  Ideally you should not require a refill of any narcotic pain medication.  Take pain medication with food to minimize nausea. In addition to the prescribed pain medication, you may take over-the-counter pain relievers such as Tylenol .  Do NOT take additional tylenol  if your pain medication already has tylenol  in it.  Aspirin  325mg  daily per instructions on bottle. Narcotic policy: Per [facility] clinic policy, our goal is ensure optimal postoperative pain control with a multimodal pain management strategy. For all [facility] patients, our goal is to wean post-operative narcotic medications by 6 weeks post-operatively, and many times sooner. If this is not possible due to utilization of pain medication prior to surgery, your [facility] doctor will support your acute post-operative pain control for the first 6 weeks postoperatively, with a plan to transition you back to your primary pain team following that. Max Spain will work to ensure a Therapist, occupational.       FOLLOWUP INSTRUCTIONS: 1. Follow up at  the Physical Therapy Clinic 3-4 days following surgery. This appointment should be scheduled unless other arrangements have been made.The Physical Therapy scheduling number is [phone] if an appointment has not already been arranged.  2. Contact Dr. Verline Glow office during office hours at [phone] or the practice after hours line at  [phone] for non-emergencies. For medical emergencies call 911.   Discharge Location: Home   Post Anesthesia Home Care Instructions  Activity: Get plenty of rest for the remainder of the day. A responsible individual must stay with you for 24 hours following the procedure.  For the next 24 hours, DO NOT: -Drive a car -Advertising copywriter -Drink alcoholic beverages -Take any medication unless instructed by your physician -Make any legal decisions or sign important papers.  Meals: Start with liquid foods such as gelatin or soup. Progress to regular foods as tolerated. Avoid greasy, spicy, heavy foods. If nausea and/or vomiting occur, drink only clear liquids until the nausea and/or vomiting subsides. Call your physician if vomiting continues.  Special Instructions/Symptoms: Your throat may feel dry or sore from the anesthesia or the breathing tube placed in your throat during surgery. If this causes discomfort, gargle with warm salt water. The discomfort should disappear within 24 hours.  If you had a scopolamine patch placed behind your ear for the management of post- operative nausea and/or vomiting:  1. The medication in the patch is effective for 72 hours, after which it should be removed.  Wrap patch in a tissue and discard in the trash. Wash hands thoroughly with soap and water. 2. You may remove the patch earlier than 72 hours if you experience unpleasant side effects which may include dry mouth, dizziness or visual disturbances. 3. Avoid touching the patch. Wash your hands with soap and water after contact with the patch.    Last Tylenol  given at 9:00am today Last NSAIDs given at 11:51am today

## 2024-04-06 NOTE — Brief Op Note (Signed)
   Brief Op Note  Date of Surgery: 04/06/2024  Preoperative Diagnosis: LEFT KNEE MEDIAL MENISCUS TEAR  Postoperative Diagnosis: same  Procedure: Procedure(s): ARTHROSCOPY, KNEE, WITH MENISCUS REPAIR  Implants: Implant Name Type Inv. Item Serial No. Manufacturer Lot No. LRB No. Used Action  ANCHOR SUT 1.8 [employer]IB[employer] SB KL - [id number] Anchor ANCHOR SUT 1.8 [employer] [id number] Left 1 Implanted    Surgeons: Surgeon(s): Wilhelmenia Harada, MD  Anesthesia: General    Estimated Blood Loss: See anesthesia record  Complications: None  Condition to PACU: Stable  Carmina Chris, MD 04/06/2024 12:11 PM

## 2024-04-06 NOTE — Anesthesia Preprocedure Evaluation (Addendum)
 Anesthesia Evaluation  Patient identified by MRN, date of birth, ID band Patient awake    Reviewed: Allergy & Precautions, NPO status , Patient's Chart, lab work & pertinent test results  History of Anesthesia Complications (+) PONV and history of anesthetic complications  Airway Mallampati: II  TM Distance: >3 FB Neck ROM: Full   Comment: Previous grade I view with MAC 3, easy mask Dental  (+) Edentulous Upper, Dental Advisory Given   Pulmonary neg shortness of breath, asthma (no recent flares, last used inhaler 5/11) , neg sleep apnea, neg COPD, neg recent URI, former smoker   Pulmonary exam normal breath sounds clear to auscultation       Cardiovascular (-) hypertension(-) angina (-) Past MI, (-) Cardiac Stents and (-) CABG (-) dysrhythmias  Rhythm:Regular Rate:Normal  HLD   Neuro/Psych  Headaches, neg Seizures PSYCHIATRIC DISORDERS Anxiety     CVA, No Residual Symptoms    GI/Hepatic Neg liver ROS,GERD  Medicated,,  Endo/Other  negative endocrine ROS    Renal/GU negative Renal ROS     Musculoskeletal  (+) Arthritis , Osteoarthritis,    Abdominal  (+) + obese  Peds  Hematology negative hematology ROS (+)   Anesthesia Other Findings   Reproductive/Obstetrics Endometriosis                              Anesthesia Physical Anesthesia Plan  ASA: 2  Anesthesia Plan: General   Post-op Pain Management: Tylenol  PO (pre-op)*   Induction: Intravenous  PONV Risk Score and Plan: 3 and Ondansetron , Dexamethasone , Midazolam  and Treatment may vary due to age or medical condition  Airway Management Planned: LMA  Additional Equipment:   Intra-op Plan:   Post-operative Plan: Extubation in OR  Informed Consent: I have reviewed the patients History and Physical, chart, labs and discussed the procedure including the risks, benefits and alternatives for the proposed anesthesia with the patient or  authorized representative who has indicated his/her understanding and acceptance.     Dental advisory given  Plan Discussed with: CRNA and Anesthesiologist  Anesthesia Plan Comments: (Risks of general anesthesia discussed including, but not limited to, sore throat, hoarse voice, chipped/damaged teeth, injury to vocal cords, nausea and vomiting, allergic reactions, lung infection, heart attack, stroke, and death. All questions answered. )        Anesthesia Quick Evaluation

## 2024-04-07 ENCOUNTER — Encounter (HOSPITAL_BASED_OUTPATIENT_CLINIC_OR_DEPARTMENT_OTHER): Payer: Self-pay | Admitting: Orthopaedic Surgery

## 2024-04-09 ENCOUNTER — Ambulatory Visit (HOSPITAL_BASED_OUTPATIENT_CLINIC_OR_DEPARTMENT_OTHER): Attending: Orthopaedic Surgery | Admitting: Physical Therapy

## 2024-04-09 ENCOUNTER — Other Ambulatory Visit: Payer: Self-pay

## 2024-04-09 ENCOUNTER — Encounter (HOSPITAL_BASED_OUTPATIENT_CLINIC_OR_DEPARTMENT_OTHER): Payer: Self-pay | Admitting: Physical Therapy

## 2024-04-09 DIAGNOSIS — M25562 Pain in left knee: Secondary | ICD-10-CM

## 2024-04-09 DIAGNOSIS — M6281 Muscle weakness (generalized): Secondary | ICD-10-CM

## 2024-04-09 DIAGNOSIS — R262 Difficulty in walking, not elsewhere classified: Secondary | ICD-10-CM | POA: Diagnosis not present

## 2024-04-09 DIAGNOSIS — M25662 Stiffness of left knee, not elsewhere classified: Secondary | ICD-10-CM

## 2024-04-09 DIAGNOSIS — Z9889 Other specified postprocedural states: Secondary | ICD-10-CM | POA: Insufficient documentation

## 2024-04-09 DIAGNOSIS — R600 Localized edema: Secondary | ICD-10-CM | POA: Diagnosis not present

## 2024-04-09 NOTE — Therapy (Signed)
 OUTPATIENT PHYSICAL THERAPY EVALUATION   Patient Name: Brandi Morris MRN: [id number] DOB:February 24, 1976, 48 y.o., female Today's Date: 04/09/2024  END OF SESSION:  PT End of Session - 04/09/24 0856     Visit Number 1    Number of Visits 18    Date for PT Re-Evaluation 07/03/24    Authorization Type [employer]-    PT Start Time [phone]    PT Stop Time 0929    PT Time Calculation (min) 36 min    Activity Tolerance Patient tolerated treatment well    Behavior During Therapy WFL for tasks assessed/performed             Past Medical History:  Diagnosis Date   Asthma    Exercise induced   Endometriosis    Family history of adverse reaction to anesthesia    Hyperlipidemia    Interstitial cystitis    Kidney stone    Migraine    Multiple allergies    Stroke (HCC)    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   Past Surgical History:  Procedure Laterality Date   ABDOMINAL HYSTERECTOMY     ABLATION     BIOPSY  08/28/2023   Procedure: BIOPSY;  Surgeon: Luke Salaam, MD;  Location: [facility] ENDOSCOPY;  Service: Gastroenterology;;   CERVICAL BIOPSY  W/ LOOP ELECTRODE EXCISION     CHOLECYSTECTOMY     COLONOSCOPY WITH PROPOFOL  N/A 08/28/2023   Procedure: COLONOSCOPY WITH PROPOFOL ;  Surgeon: Luke Salaam, MD;  Location: [facility] ENDOSCOPY;  Service: Gastroenterology;  Laterality: N/A;   COLPOSCOPY     CYSTOSTOMY W/ BLADDER BIOPSY     ESOPHAGOGASTRODUODENOSCOPY (EGD) WITH PROPOFOL  N/A 08/28/2023   Procedure: ESOPHAGOGASTRODUODENOSCOPY (EGD) WITH PROPOFOL ;  Surgeon: Luke Salaam, MD;  Location: [facility] ENDOSCOPY;  Service: Gastroenterology;  Laterality: N/A;   HEMOSTASIS CLIP PLACEMENT  08/28/2023   Procedure: HEMOSTASIS CLIP PLACEMENT;  Surgeon: Luke Salaam, MD;  Location: [facility] ENDOSCOPY;  Service: Gastroenterology;;   KNEE ARTHROSCOPY WITH MENISCAL REPAIR Left 04/06/2024   Procedure: ARTHROSCOPY, KNEE, WITH MENISCUS REPAIR;  Surgeon: Wilhelmenia Harada, MD;  Location: [facility];   Service: Orthopedics;  Laterality: Left;   KNEE SURGERY     LEEP     POLYPECTOMY  08/28/2023   Procedure: POLYPECTOMY;  Surgeon: Luke Salaam, MD;  Location: [facility] ENDOSCOPY;  Service: Gastroenterology;;   RHINOPLASTY     TENDON REPAIR Left 07/26/2015   Procedure: REPAIR WITH RECONSTRUCTION LEFT THUMB;  Surgeon: Florida Hurter, MD;  Location: [facility] OR;  Service: Orthopedics;  Laterality: Left;   TONSILLECTOMY     TUBAL LIGATION     Patient Active Problem List   Diagnosis Date Noted   S/P left knee arthroscopy 01/01/24 medial meniscus repair 02/04/2024   Chronic pain of left knee 02/04/2024   Adenomatous polyp of colon 08/28/2023   Diarrhea 07/08/2023   Colon cancer screening 07/08/2023   Anxiety state 07/08/2023   Weight loss, abnormal 04/23/2023   Fever 04/23/2023   Night sweat 04/23/2023   Nausea and vomiting 04/23/2023   S/P cholecystectomy 04/23/2023   S/P hysterectomy 04/23/2023   Localized swelling of left thumb 09/06/2021   Osteoarthritis of carpometacarpal (CMC) joint of thumb 01/04/2021   Pain in thumb joint with movement, left 01/04/2021   Dyspareunia in female 03/23/2019   Chronic pelvic pain in female 03/23/2019   Hyperlipidemia 07/30/2007   Migraine 07/30/2007   ALLERGIC RHINITIS 07/30/2007   Asthma 07/30/2007   INTERSTITIAL CYSTITIS 07/30/2007   INSOMNIA 07/30/2007     REFERRING  PROVIDER:  Wilhelmenia Harada, MD     REFERRING DIAG:  [phone] (ICD-10-CM) - S/P left knee arthroscopy    Left knee medial meniscal repair   Rationale for Evaluation and Treatment: Rehabilitation  THERAPY DIAG:  Acute pain of left knee  Stiffness of left knee, not elsewhere classified  Muscle weakness (generalized)  Localized edema  Difficulty in walking, not elsewhere classified  ONSET DATE: DOS 04/06/24 (previous on 01/01/24)   SUBJECTIVE:                                                                                                                                                                                            SUBJECTIVE STATEMENT: No pain meds necessary today, no crutches since yesterday.   PERTINENT HISTORY:  none  PAIN:  Are you having pain? Yes: NPRS scale: 2-3 Pain location: Lt knee Pain description: achey, sometimes pinchy Aggravating factors: crossing legs in long sitting Relieving factors: iceman  PRECAUTIONS:  None  RED FLAGS: None   WEIGHT BEARING RESTRICTIONS:  WBAT  FALLS:  Has patient fallen in last 6 months? Yes. Number of falls 5- none post fall, all falls from knee buckling.   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    OBJECTIVE:  Note: Objective measures were completed at Evaluation unless otherwise noted.  PATIENT SURVEYS:  LEFS  Extreme difficulty/unable (0), Quite a bit of difficulty (1), Moderate difficulty (2), Little difficulty (3), No difficulty (4) Survey date:  04/09/24  Any of your usual work, housework or school activities 1  2. Usual hobbies, recreational or sporting activities 1  3. Getting into/out of the bath 3  4. Walking between rooms 4  5. Putting on socks/shoes 3  6. Squatting  0  7. Lifting an object, like a bag of groceries from the floor 4  8. Performing light activities around your home 4  9. Performing heavy activities around your home 0  10. Getting into/out of a car 3  11. Walking 2 blocks 0  12. Walking 1 mile 0  13. Going up/down 10 stairs (1 flight) 1  14. Standing for 1 hour 4  15.  sitting for 1 hour 4  16. Running on even ground 0  17. Running on uneven ground 0  18. Making sharp turns while running fast 0  19. Hopping  0  20. Rolling over in bed 4  Score total:  36/80     COGNITIVE STATUS: Within functional limits for tasks assessed  SENSATION: WFL  EDEMA:  Eval: minimal  GAIT: EVAL: no AD, circumduction and  antalgic pattern, lacking knee flexion   Body Part #1 Knee  EVAL: ROM 0-105                                                                                                                             TREATMENT DATE:   04/09/24: See HEP Gait training- glut set in stance phase, trunk rotation/arm swing   PATIENT EDUCATION:  Education details: Anatomy of condition, POC, HEP, exercise form/rationale Person educated: Patient Education method: Explanation, Demonstration, Tactile cues, Verbal cues, and Handouts Education comprehension: verbalized understanding, returned demonstration, verbal cues required, tactile cues required, and needs further education  HOME EXERCISE PROGRAM: Access Code: [email or web address] URL: [email or web address] Date: 04/09/2024 Prepared by: Keven Pel  Program Notes squeeze glutes as you step on left leg, allow arms to swing  Exercises - Long Sitting Quad Set  - 3-5 x daily - 7 x weekly - 1 sets - 5 reps - 10s hold - Long Sitting Hamstring Stretch  - 3 x daily - 7 x weekly - 2 sets - 2 reps - 5 breaths hold   ASSESSMENT:  CLINICAL IMPRESSION: Patient is a 48 y.o. F who was seen today for physical therapy evaluation and treatment for s/p medial meniscus repair. Pt will benefit from skilled PT to properly progress through post op protocol and achieve functional LTGs.      REHAB POTENTIAL: Good  CLINICAL DECISION MAKING: Stable/uncomplicated  EVALUATION COMPLEXITY: Low   GOALS: Goals reviewed with patient? Yes  SHORT TERM GOALS: Target date: 04/30/24  Demo 10 SLR without lag, without pain Baseline:will progress as appropriate Goal status: INITIAL  2.  Ambulation without sidebend/antalgic pattern Baseline: began practicing at eval Goal status: INITIAL    LONG TERM GOALS: Target date: POC date  LEFS to improve by MDC Baseline: see obj Goal status: INITIAL  2.  Navigate stairs with reciprocal pattern and proper  form Baseline: will progress as appropriate Goal status: INITIAL  3.  Able to get up/down from floor to care for young niece and grandchildren Baseline: unable at eval Goal status: INITIAL  4.  Able to perform proper squat/lift form, minimal discomfort, to complete household construction projects Baseline: will progress as appropriate Goal status: INITIAL     PLAN:  PT FREQUENCY: 1-2x/week  PT DURATION: POC date  PLANNED INTERVENTIONS: 97164- PT Re-evaluation, 97750- Physical Performance Testing, 97110-Therapeutic exercises, 97530- Therapeutic activity, [id number]- Neuromuscular re-education, 97535- Self Care, 16109- Manual therapy, [phone]- Gait training, [phone]- Aquatic Therapy, Patient/Family education, Balance training, Stair training, Taping, Dry Needling, Joint mobilization, Spinal mobilization, Scar mobilization, and Cryotherapy.  PLAN FOR NEXT SESSION: per meniscal repair protocol   Josehua Hammar C. Tayler Heiden PT, DPT 04/09/24 12:06 PM  For all possible CPT codes, reference the Planned Interventions line above.     Check all conditions that are expected to impact treatment: {Conditions expected to impact treatment:None of these  apply   If treatment provided at initial evaluation, no treatment charged due to lack of authorization.

## 2024-04-14 ENCOUNTER — Telehealth (HOSPITAL_BASED_OUTPATIENT_CLINIC_OR_DEPARTMENT_OTHER): Payer: Self-pay

## 2024-04-14 ENCOUNTER — Ambulatory Visit (HOSPITAL_BASED_OUTPATIENT_CLINIC_OR_DEPARTMENT_OTHER)

## 2024-04-14 ENCOUNTER — Encounter (HOSPITAL_BASED_OUTPATIENT_CLINIC_OR_DEPARTMENT_OTHER): Payer: Self-pay

## 2024-04-14 NOTE — Therapy (Deleted)
 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   Past Surgical History:  Procedure Laterality Date   ABDOMINAL HYSTERECTOMY     ABLATION     BIOPSY  08/28/2023   Procedure: BIOPSY;  Surgeon: Luke Salaam, MD;  Location: [facility] ENDOSCOPY;  Service: Gastroenterology;;   CERVICAL BIOPSY  W/ LOOP ELECTRODE EXCISION     CHOLECYSTECTOMY     COLONOSCOPY WITH PROPOFOL  N/A 08/28/2023   Procedure: COLONOSCOPY WITH PROPOFOL ;  Surgeon: Luke Salaam, MD;  Location: [facility] ENDOSCOPY;  Service: Gastroenterology;  Laterality: N/A;   COLPOSCOPY     CYSTOSTOMY W/ BLADDER BIOPSY     ESOPHAGOGASTRODUODENOSCOPY (EGD) WITH PROPOFOL  N/A 08/28/2023   Procedure: ESOPHAGOGASTRODUODENOSCOPY (EGD) WITH PROPOFOL ;  Surgeon: Luke Salaam, MD;  Location: [facility] ENDOSCOPY;  Service: Gastroenterology;  Laterality: N/A;   HEMOSTASIS CLIP PLACEMENT  08/28/2023   Procedure: HEMOSTASIS CLIP PLACEMENT;  Surgeon: Luke Salaam, MD;  Location: [facility] ENDOSCOPY;  Service: Gastroenterology;;   KNEE ARTHROSCOPY WITH MENISCAL REPAIR Left 04/06/2024   Procedure: ARTHROSCOPY, KNEE, WITH MENISCUS REPAIR;  Surgeon: Wilhelmenia Harada, MD;  Location: [facility];  Service: Orthopedics;  Laterality: Left;   KNEE SURGERY     LEEP     POLYPECTOMY  08/28/2023   Procedure: POLYPECTOMY;  Surgeon: Luke Salaam, MD;  Location: [facility] ENDOSCOPY;  Service: Gastroenterology;;   RHINOPLASTY     TENDON REPAIR Left 07/26/2015   Procedure: REPAIR WITH RECONSTRUCTION LEFT THUMB;  Surgeon: Florida Hurter, MD;  Location: [facility] OR;  Service:  Orthopedics;  Laterality: Left;   TONSILLECTOMY     TUBAL LIGATION     Patient Active Problem List   Diagnosis Date Noted   S/P left knee arthroscopy 01/01/24 medial meniscus repair 02/04/2024   Chronic pain of left knee 02/04/2024   Adenomatous polyp of colon 08/28/2023   Diarrhea 07/08/2023   Colon cancer screening 07/08/2023   Anxiety state 07/08/2023   Weight loss, abnormal 04/23/2023   Fever 04/23/2023   Night sweat 04/23/2023   Nausea and vomiting 04/23/2023   S/P cholecystectomy 04/23/2023   S/P hysterectomy 04/23/2023   Localized swelling of left thumb 09/06/2021   Osteoarthritis of carpometacarpal (CMC) joint of thumb 01/04/2021   Pain in thumb joint with movement, left 01/04/2021   Dyspareunia in female 03/23/2019   Chronic pelvic pain in female 03/23/2019   Hyperlipidemia 07/30/2007   Migraine 07/30/2007   ALLERGIC RHINITIS 07/30/2007   Asthma 07/30/2007   INTERSTITIAL CYSTITIS 07/30/2007   INSOMNIA 07/30/2007     REFERRING PROVIDER:  Wilhelmenia Harada, MD     REFERRING DIAG:  [phone] (ICD-10-CM) - S/P left knee arthroscopy    Left knee medial meniscal repair   Rationale for Evaluation and Treatment: Rehabilitation  THERAPY DIAG:  No diagnosis found.  ONSET DATE: DOS 04/06/24 (previous on 01/01/24)   SUBJECTIVE:  SUBJECTIVE STATEMENT: No pain meds necessary today, no crutches since yesterday.   PERTINENT HISTORY:  none  PAIN:  Are you having pain? Yes: NPRS scale: 2-3 Pain location: Lt knee Pain description: achey, sometimes pinchy Aggravating factors: crossing legs in long sitting Relieving factors: iceman  PRECAUTIONS:  None  RED FLAGS: None   WEIGHT BEARING RESTRICTIONS:  WBAT  FALLS:  Has patient fallen in last 6 months? Yes. Number of falls 5- none  post fall, all falls from knee buckling.   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    OBJECTIVE:  Note: Objective measures were completed at Evaluation unless otherwise noted.  PATIENT SURVEYS:  LEFS  Extreme difficulty/unable (0), Quite a bit of difficulty (1), Moderate difficulty (2), Little difficulty (3), No difficulty (4) Survey date:  04/09/24  Any of your usual work, housework or school activities 1  2. Usual hobbies, recreational or sporting activities 1  3. Getting into/out of the bath 3  4. Walking between rooms 4  5. Putting on socks/shoes 3  6. Squatting  0  7. Lifting an object, like a bag of groceries from the floor 4  8. Performing light activities around your home 4  9. Performing heavy activities around your home 0  10. Getting into/out of a car 3  11. Walking 2 blocks 0  12. Walking 1 mile 0  13. Going up/down 10 stairs (1 flight) 1  14. Standing for 1 hour 4  15.  sitting for 1 hour 4  16. Running on even ground 0  17. Running on uneven ground 0  18. Making sharp turns while running fast 0  19. Hopping  0  20. Rolling over in bed 4  Score total:  36/80     COGNITIVE STATUS: Within functional limits for tasks assessed   SENSATION: WFL  EDEMA:  Eval: minimal  GAIT: EVAL: no AD, circumduction and antalgic pattern, lacking knee flexion   Body Part #1 Knee  EVAL: ROM 0-105                                                                                                                             TREATMENT DATE:   04/09/24: See HEP Gait training- glut set in stance phase, trunk rotation/arm swing   PATIENT EDUCATION:  Education details: Anatomy of condition, POC, HEP, exercise form/rationale Person educated: Patient Education method: Explanation, Demonstration, Tactile cues, Verbal cues, and  Handouts Education comprehension: verbalized understanding, returned demonstration, verbal cues required, tactile cues required, and needs further education  HOME EXERCISE PROGRAM: Access Code: [email or web address] URL: [email or web address] Date: 04/09/2024 Prepared by: Keven Pel  Program Notes squeeze glutes as you step on left leg, allow arms to swing  Exercises - Long Sitting Quad Set  - 3-5 x daily - 7 x weekly - 1 sets -  5 reps - 10s hold - Long Sitting Hamstring Stretch  - 3 x daily - 7 x weekly - 2 sets - 2 reps - 5 breaths hold   ASSESSMENT:  CLINICAL IMPRESSION: Patient is a 48 y.o. F who was seen today for physical therapy evaluation and treatment for s/p medial meniscus repair. Pt will benefit from skilled PT to properly progress through post op protocol and achieve functional LTGs.      REHAB POTENTIAL: Good  CLINICAL DECISION MAKING: Stable/uncomplicated  EVALUATION COMPLEXITY: Low   GOALS: Goals reviewed with patient? Yes  SHORT TERM GOALS: Target date: 04/30/24  Demo 10 SLR without lag, without pain Baseline:will progress as appropriate Goal status: INITIAL  2.  Ambulation without sidebend/antalgic pattern Baseline: began practicing at eval Goal status: INITIAL    LONG TERM GOALS: Target date: POC date  LEFS to improve by MDC Baseline: see obj Goal status: INITIAL  2.  Navigate stairs with reciprocal pattern and proper form Baseline: will progress as appropriate Goal status: INITIAL  3.  Able to get up/down from floor to care for young niece and grandchildren Baseline: unable at eval Goal status: INITIAL  4.  Able to perform proper squat/lift form, minimal discomfort, to complete household construction projects Baseline: will progress as appropriate Goal status: INITIAL     PLAN:  PT FREQUENCY: 1-2x/week  PT DURATION: POC date  PLANNED INTERVENTIONS: 97164- PT Re-evaluation, 97750- Physical Performance Testing,  97110-Therapeutic exercises, 97530- Therapeutic activity, [id number]- Neuromuscular re-education, 97535- Self Care, 16109- Manual therapy, [phone]- Gait training, [phone]- Aquatic Therapy, Patient/Family education, Balance training, Stair training, Taping, Dry Needling, Joint mobilization, Spinal mobilization, Scar mobilization, and Cryotherapy.  PLAN FOR NEXT SESSION: per meniscal repair protocol   Herb Loges, PTA  04/14/24 12:27 PM  For all possible CPT codes, reference the Planned Interventions line above.     Check all conditions that are expected to impact treatment: {Conditions expected to impact treatment:None of these apply   If treatment provided at initial evaluation, no treatment charged due to lack of authorization.

## 2024-04-14 NOTE — Telephone Encounter (Signed)
 Called pt regarding missed appt. Left VM. Reminded of next visit and also offered later time slot today.

## 2024-04-15 ENCOUNTER — Encounter (HOSPITAL_BASED_OUTPATIENT_CLINIC_OR_DEPARTMENT_OTHER): Payer: Self-pay | Admitting: Orthopaedic Surgery

## 2024-04-16 ENCOUNTER — Encounter (HOSPITAL_BASED_OUTPATIENT_CLINIC_OR_DEPARTMENT_OTHER): Admitting: Physical Therapy

## 2024-04-16 ENCOUNTER — Encounter (HOSPITAL_BASED_OUTPATIENT_CLINIC_OR_DEPARTMENT_OTHER): Admitting: Orthopaedic Surgery

## 2024-04-17 ENCOUNTER — Encounter (HOSPITAL_BASED_OUTPATIENT_CLINIC_OR_DEPARTMENT_OTHER): Admitting: Physical Therapy

## 2024-04-23 ENCOUNTER — Encounter (HOSPITAL_BASED_OUTPATIENT_CLINIC_OR_DEPARTMENT_OTHER): Payer: Self-pay | Admitting: Student

## 2024-04-23 ENCOUNTER — Ambulatory Visit (HOSPITAL_BASED_OUTPATIENT_CLINIC_OR_DEPARTMENT_OTHER): Admitting: Student

## 2024-04-23 ENCOUNTER — Encounter (HOSPITAL_BASED_OUTPATIENT_CLINIC_OR_DEPARTMENT_OTHER): Payer: Self-pay | Admitting: Physical Therapy

## 2024-04-23 ENCOUNTER — Ambulatory Visit (HOSPITAL_BASED_OUTPATIENT_CLINIC_OR_DEPARTMENT_OTHER): Attending: Orthopaedic Surgery | Admitting: Physical Therapy

## 2024-04-23 DIAGNOSIS — R262 Difficulty in walking, not elsewhere classified: Secondary | ICD-10-CM

## 2024-04-23 DIAGNOSIS — Z9889 Other specified postprocedural states: Secondary | ICD-10-CM

## 2024-04-23 DIAGNOSIS — R600 Localized edema: Secondary | ICD-10-CM | POA: Diagnosis not present

## 2024-04-23 DIAGNOSIS — M25662 Stiffness of left knee, not elsewhere classified: Secondary | ICD-10-CM

## 2024-04-23 DIAGNOSIS — M6281 Muscle weakness (generalized): Secondary | ICD-10-CM

## 2024-04-23 DIAGNOSIS — M25562 Pain in left knee: Secondary | ICD-10-CM

## 2024-04-23 NOTE — Progress Notes (Signed)
 Post Operative Evaluation    Procedure/Date of Surgery: Left meniscus repair with centralization 04/06/2024  Interval History:   Patient presents 2 weeks status post left medial meniscal repair with centralization.  Overall today she reports doing extremely well.  She has no pain within the knee and almost full range of motion.  She has noticed some increased drainage coming from the lateral incision of the anterior knee that over the past few days has turned more yellowish in color.  Denies any redness, fever, or chills.   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   Past Surgical History:  Procedure Laterality Date   ABDOMINAL HYSTERECTOMY     ABLATION     BIOPSY  08/28/2023   Procedure: BIOPSY;  Surgeon: Luke Salaam, MD;  Location: [facility] ENDOSCOPY;  Service: Gastroenterology;;   CERVICAL BIOPSY  W/ LOOP ELECTRODE EXCISION     CHOLECYSTECTOMY     COLONOSCOPY WITH PROPOFOL  N/A 08/28/2023   Procedure: COLONOSCOPY WITH PROPOFOL ;  Surgeon: Luke Salaam, MD;  Location: [facility] ENDOSCOPY;  Service: Gastroenterology;  Laterality: N/A;   COLPOSCOPY     CYSTOSTOMY W/ BLADDER BIOPSY     ESOPHAGOGASTRODUODENOSCOPY (EGD) WITH PROPOFOL  N/A 08/28/2023   Procedure: ESOPHAGOGASTRODUODENOSCOPY (EGD) WITH PROPOFOL ;  Surgeon: Luke Salaam, MD;  Location: [facility] ENDOSCOPY;  Service: Gastroenterology;  Laterality: N/A;   HEMOSTASIS CLIP PLACEMENT  08/28/2023   Procedure: HEMOSTASIS CLIP PLACEMENT;  Surgeon: Luke Salaam, MD;  Location: [facility] ENDOSCOPY;  Service: Gastroenterology;;   KNEE ARTHROSCOPY WITH MENISCAL REPAIR Left 04/06/2024   Procedure: ARTHROSCOPY, KNEE, WITH  MENISCUS REPAIR;  Surgeon: Wilhelmenia Harada, MD;  Location: [facility];  Service: Orthopedics;  Laterality: Left;   KNEE SURGERY     LEEP     POLYPECTOMY  08/28/2023   Procedure: POLYPECTOMY;  Surgeon: Luke Salaam, MD;  Location: [facility] ENDOSCOPY;  Service: Gastroenterology;;   RHINOPLASTY     TENDON REPAIR Left 07/26/2015   Procedure: REPAIR WITH RECONSTRUCTION LEFT THUMB;  Surgeon: Florida Hurter, MD;  Location: [facility] OR;  Service: Orthopedics;  Laterality: Left;   TONSILLECTOMY     TUBAL LIGATION     Social History   Socioeconomic History   Marital status: Significant Other    Spouse name: Susana Enter   Number of children: 3   Years of education: 15   Highest education level: Not on file  Occupational History   Occupation: Holiday representative  Tobacco Use   Smoking status: Former    Current packs/day: 1.00    Average packs/day: 1 pack/day for 25.0 years (25.0 ttl pk-yrs)    Types: Cigarettes   Smokeless tobacco: Never  Vaping Use   Vaping status: Never Used  Substance and Sexual Activity   Alcohol use: Not Currently   Drug use: No   Sexual activity: Yes    Birth control/protection: Surgical  Other Topics Concern   Not on file  Social History Narrative   Fun: Fish and hunt   Denies  abuse and feels safe at home.    Social Drivers of Corporate investment banker Strain: Not on file  Food Insecurity: Not on file  Transportation Needs: No Transportation Needs (06/13/2023)   PRAPARE - Administrator, Civil Service (Medical): No    Lack of Transportation (Non-Medical): No  Physical Activity: Not on file  Stress: Not on file  Social Connections: Not on file   Family History  Problem Relation Age of Onset   Heart disease Mother    COPD Mother    Diabetes Father    Hypertension Father    Stroke Father    Heart disease Father    Breast cancer Maternal Grandmother    Cancer Maternal Grandfather        melanoma   Heart disease Paternal Grandmother     Stroke Paternal Grandmother    Heart attack Paternal Grandmother    Heart disease Paternal Grandfather    Stroke Paternal Grandfather    Heart attack Paternal Grandfather    Allergies  Allergen Reactions   Bee Venom Anaphylaxis   Flexeril [Cyclobenzaprine] Other (See Comments)    Reaction:  Makes pt violent    Lavender Oil Anaphylaxis and Other (See Comments)    Pt states that her lungs and sinuses fill-up with fluid.     Robaxin [Methocarbamol] Other (See Comments)    Reaction:  Makes pt violent    Shellfish Allergy Hives   Amitriptyline Other (See Comments)    Reaction:  Hallucinations    Entex Other (See Comments)    Reaction:  Dizziness    Singulair  [Montelukast  Sodium] Other (See Comments)    Reaction:  Agitation  Pt states that her sinuses fill-up with fluid.     Etodolac Other (See Comments)    Reaction:  Dizziness    Iodine Hives    CT Contrast   Mushroom Extract Complex (Obsolete) Hives   Current Outpatient Medications  Medication Sig Dispense Refill   albuterol  (VENTOLIN  HFA) 108 (90 Base) MCG/ACT inhaler Inhale 1-2 puffs into the lungs every 6 (six) hours as needed. 18 g 0   aspirin  EC 325 MG tablet Take 1 tablet (325 mg total) by mouth daily. 14 tablet 0   bismuth  subsalicylate (PEPTO-BISMOL) 262 MG chewable tablet Chew 2 tablets (524 mg total) by mouth in the morning and at bedtime. 60 tablet 0   colestipol  (COLESTID ) 1 g tablet Take 2 tablets (2 g total) by mouth daily. 60 tablet 5   diphenhydrAMINE  (BENADRYL ) 25 MG tablet Take 50 mg by mouth every 6 (six) hours as needed for itching or allergies.      EPINEPHrine  (EPIPEN  2-PAK) 0.3 mg/0.3 mL IJ SOAJ injection Inject 0.3 mg into the muscle once as needed (for severe allergic reaction).     hyoscyamine  (LEVSIN) 0.125 MG tablet Take 1 tablet (0.125 mg total) by mouth every 4 (four) hours as needed. 90 tablet 5   meloxicam  (MOBIC ) 7.5 MG tablet Take 1 tablet by mouth 2 (two) times daily with a meal.      methylPREDNISolone  (MEDROL  DOSEPAK) 4 MG TBPK tablet Take per packet instructions (Patient not taking: Reported on 03/30/2024) 1 each 0   omeprazole  (PRILOSEC) 40 MG capsule Take 1 capsule (40 mg total) by mouth daily. 30 capsule 1   ondansetron  (ZOFRAN ) 4 MG tablet Take 1 tablet (4 mg total) by mouth every 8 (eight) hours as needed for nausea or vomiting. 30 tablet 1   oxyCODONE  (ROXICODONE ) 5 MG immediate release  tablet Take 1 tablet (5 mg total) by mouth every 4 (four) hours as needed for severe pain (pain score 7-10) or breakthrough pain. 15 tablet 0   traMADol  (ULTRAM ) 50 MG tablet Take 1 tablet (50 mg total) by mouth every 6 (six) hours as needed for severe pain (pain score 7-10). 30 tablet 0   No current facility-administered medications for this visit.   No results found.  Review of Systems:   A ROS was performed including pertinent positives and negatives as documented in the HPI.   Musculoskeletal Exam:    Last menstrual period 03/03/2007.  Left knee incisions are well-appearing without evidence of erythema and there is mild yellowish drainage noted from the lateral portal incision.  Active range of motion is from -3 to 120 degrees.  No tenderness palpation throughout the knee.  Patient is ambulating with normal gait.  Imaging:     Assessment:   Patient is 2 weeks status post left medial meniscal repair with centralization overall doing very well.  She is working with physical therapy and reports no pain at this time which is much improved from prior to surgery.  She has noted some increased yellowish drainage coming from the lateral incision which I suspect the presence of an underlying seroma as opposed to developing infection.  Will continue to monitor this and should drainage continue to persist or worsen, may consider a short course of antibiotics.  Otherwise we will plan to have her continue PT and follow-up in another 4 weeks with Dr. Hermina Loosen.  Plan :    - Return to clinic  in 4 weeks for next postop visit      I personally saw and evaluated the patient, and participated in the management and treatment plan.  Sharrell Deck, PA-C Orthopedics

## 2024-04-23 NOTE — Therapy (Signed)
 OUTPATIENT PHYSICAL THERAPY TREATMENT   Patient Name: Brandi Morris MRN: [id number] DOB:12-May-1976, 48 y.o., female Today's Date: 04/23/2024  END OF SESSION:  PT End of Session - 04/23/24 1334     Visit Number 2    Number of Visits 18    Date for PT Re-Evaluation 07/03/24    Authorization Type [employer]-    PT Start Time 1334    PT Stop Time 1417    PT Time Calculation (min) 43 min    Activity Tolerance Patient tolerated treatment well    Behavior During Therapy WFL for tasks assessed/performed             Past Medical History:  Diagnosis Date   Asthma    Exercise induced   Endometriosis    Family history of adverse reaction to anesthesia    Hyperlipidemia    Interstitial cystitis    Kidney stone    Migraine    Multiple allergies    Stroke (HCC)    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   Past Surgical History:  Procedure Laterality Date   ABDOMINAL HYSTERECTOMY     ABLATION     BIOPSY  08/28/2023   Procedure: BIOPSY;  Surgeon: Luke Salaam, MD;  Location: [facility] ENDOSCOPY;  Service: Gastroenterology;;   CERVICAL BIOPSY  W/ LOOP ELECTRODE EXCISION     CHOLECYSTECTOMY     COLONOSCOPY WITH PROPOFOL  N/A 08/28/2023   Procedure: COLONOSCOPY WITH PROPOFOL ;  Surgeon: Luke Salaam, MD;  Location: [facility] ENDOSCOPY;  Service: Gastroenterology;  Laterality: N/A;   COLPOSCOPY     CYSTOSTOMY W/ BLADDER BIOPSY     ESOPHAGOGASTRODUODENOSCOPY (EGD) WITH PROPOFOL  N/A 08/28/2023   Procedure: ESOPHAGOGASTRODUODENOSCOPY (EGD) WITH PROPOFOL ;  Surgeon: Luke Salaam, MD;  Location: [facility] ENDOSCOPY;  Service: Gastroenterology;  Laterality: N/A;   HEMOSTASIS CLIP PLACEMENT  08/28/2023   Procedure: HEMOSTASIS CLIP PLACEMENT;  Surgeon: Luke Salaam, MD;  Location: [facility] ENDOSCOPY;  Service: Gastroenterology;;   KNEE ARTHROSCOPY WITH MENISCAL REPAIR Left 04/06/2024   Procedure: ARTHROSCOPY, KNEE, WITH MENISCUS REPAIR;  Surgeon: Wilhelmenia Harada, MD;  Location: [facility];   Service: Orthopedics;  Laterality: Left;   KNEE SURGERY     LEEP     POLYPECTOMY  08/28/2023   Procedure: POLYPECTOMY;  Surgeon: Luke Salaam, MD;  Location: [facility] ENDOSCOPY;  Service: Gastroenterology;;   RHINOPLASTY     TENDON REPAIR Left 07/26/2015   Procedure: REPAIR WITH RECONSTRUCTION LEFT THUMB;  Surgeon: Florida Hurter, MD;  Location: [facility] OR;  Service: Orthopedics;  Laterality: Left;   TONSILLECTOMY     TUBAL LIGATION     Patient Active Problem List   Diagnosis Date Noted   S/P left knee arthroscopy 01/01/24 medial meniscus repair 02/04/2024   Chronic pain of left knee 02/04/2024   Adenomatous polyp of colon 08/28/2023   Diarrhea 07/08/2023   Colon cancer screening 07/08/2023   Anxiety state 07/08/2023   Weight loss, abnormal 04/23/2023   Fever 04/23/2023   Night sweat 04/23/2023   Nausea and vomiting 04/23/2023   S/P cholecystectomy 04/23/2023   S/P hysterectomy 04/23/2023   Localized swelling of left thumb 09/06/2021   Osteoarthritis of carpometacarpal (CMC) joint of thumb 01/04/2021   Pain in thumb joint with movement, left 01/04/2021   Dyspareunia in female 03/23/2019   Chronic pelvic pain in female 03/23/2019   Hyperlipidemia 07/30/2007   Migraine 07/30/2007   ALLERGIC RHINITIS 07/30/2007   Asthma 07/30/2007   INTERSTITIAL CYSTITIS 07/30/2007   INSOMNIA 07/30/2007     REFERRING  PROVIDER:  Wilhelmenia Harada, MD     REFERRING DIAG:  [id number] (ICD-10-CM) - S/P left knee arthroscopy    Left knee medial meniscal repair   Rationale for Evaluation and Treatment: Rehabilitation  THERAPY DIAG:  Acute pain of left knee  Stiffness of left knee, not elsewhere classified  Muscle weakness (generalized)  Localized edema  Difficulty in walking, not elsewhere classified  ONSET DATE: DOS 04/06/24 (previous on 01/01/24)   SUBJECTIVE:                                                                                                                                                                                            SUBJECTIVE STATEMENT: Patient states knee is doing well. Stairs are tough especially coming down.   PERTINENT HISTORY:  none  PAIN:  Are you having pain? Yes: NPRS scale: 2-3 Pain location: Lt knee Pain description: sore Aggravating factors: crossing legs in long sitting Relieving factors: iceman  PRECAUTIONS:  None  RED FLAGS: None   WEIGHT BEARING RESTRICTIONS:  WBAT  FALLS:  Has patient fallen in last 6 months? Yes. Number of falls 5- none post fall, all falls from knee buckling.   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    OBJECTIVE:  Note: Objective measures were completed at Evaluation unless otherwise noted.  PATIENT SURVEYS:  LEFS  Extreme difficulty/unable (0), Quite a bit of difficulty (1), Moderate difficulty (2), Little difficulty (3), No difficulty (4) Survey date:  04/09/24  Any of your usual work, housework or school activities 1  2. Usual hobbies, recreational or sporting activities 1  3. Getting into/out of the bath 3  4. Walking between rooms 4  5. Putting on socks/shoes 3  6. Squatting  0  7. Lifting an object, like a bag of groceries from the floor 4  8. Performing light activities around your home 4  9. Performing heavy activities around your home 0  10. Getting into/out of a car 3  11. Walking 2 blocks 0  12. Walking 1 mile 0  13. Going up/down 10 stairs (1 flight) 1  14. Standing for 1 hour 4  15.  sitting for 1 hour 4  16. Running on even ground 0  17. Running on uneven ground 0  18. Making sharp turns while running fast 0  19. Hopping  0  20. Rolling over in bed 4  Score total:  36/80     COGNITIVE STATUS: Within functional limits for tasks assessed  SENSATION: WFL  EDEMA:  Eval: minimal  GAIT: EVAL: no AD, circumduction and  antalgic pattern, lacking knee flexion   Body Part #1 Knee  EVAL: ROM 0-105 04/23/24: 0 to 145                                                                                                                            TREATMENT DATE:  04/23/24 SLR 3 x 10 HR/ TR 1 x 20 each Step up 4 inch 2 x 10 Manual: STM to quads/adductors  04/09/24: See HEP Gait training- glut set in stance phase, trunk rotation/arm swing   PATIENT EDUCATION:  Education details: Anatomy of condition, POC, HEP, exercise form/rationale Person educated: Patient Education method: Explanation, Demonstration, Tactile cues, Verbal cues, and Handouts Education comprehension: verbalized understanding, returned demonstration, verbal cues required, tactile cues required, and needs further education  HOME EXERCISE PROGRAM: Access Code: [email or web address] URL: [email or web address] Date: 04/09/2024 Prepared by: Keven Pel  Program Notes squeeze glutes as you step on left leg, allow arms to swing  Exercises - Long Sitting Quad Set  - 3-5 x daily - 7 x weekly - 1 sets - 5 reps - 10s hold - Long Sitting Hamstring Stretch  - 3 x daily - 7 x weekly - 2 sets - 2 reps - 5 breaths hold   ASSESSMENT:  CLINICAL IMPRESSION: Patient demonstrating improving ROM without resistance. Some drainage from lateral suture area. No lag with SLR. Began functional strengthening with only mild soreness. Tenderness in quad/adductors improves with STM. Patient will continue to benefit from physical therapy in order to improve function and reduce impairment.     REHAB POTENTIAL: Good  CLINICAL DECISION MAKING: Stable/uncomplicated  EVALUATION COMPLEXITY: Low   GOALS: Goals reviewed with patient? Yes  SHORT TERM GOALS: Target date: 04/30/24  Demo 10 SLR without lag, without pain Baseline:will progress as appropriate Goal status: INITIAL  2.  Ambulation without sidebend/antalgic pattern Baseline: began practicing at  eval Goal status: INITIAL    LONG TERM GOALS: Target date: POC date  LEFS to improve by MDC Baseline: see obj Goal status: INITIAL  2.  Navigate stairs with reciprocal pattern and proper form Baseline: will progress as appropriate Goal status: INITIAL  3.  Able to get up/down from floor to care for young niece and grandchildren Baseline: unable at eval Goal status: INITIAL  4.  Able to perform proper squat/lift form, minimal discomfort, to complete household construction projects Baseline: will progress as appropriate Goal status: INITIAL     PLAN:  PT FREQUENCY: 1-2x/week  PT DURATION: POC date  PLANNED INTERVENTIONS: 97164- PT Re-evaluation, 97750- Physical Performance Testing, 97110-Therapeutic exercises, 97530- Therapeutic activity, [id number]- Neuromuscular re-education, 97535- Self Care, 16109- Manual therapy, [phone]- Gait training, [phone]- Aquatic Therapy, Patient/Family education, Balance training, Stair training, Taping, Dry Needling, Joint mobilization, Spinal mobilization, Scar mobilization, and Cryotherapy.  PLAN FOR NEXT SESSION: per meniscal repair protocol    Beather Liming, PT, DPT 04/23/2024, 2:19 PM  For all possible CPT codes, reference the Planned Interventions line above.     Check all conditions that are expected to impact treatment: {Conditions expected to impact treatment:None of these apply   If treatment provided at initial evaluation, no treatment charged due to lack of authorization.

## 2024-04-29 DIAGNOSIS — Z419 Encounter for procedure for purposes other than remedying health state, unspecified: Secondary | ICD-10-CM | POA: Diagnosis not present

## 2024-04-30 ENCOUNTER — Ambulatory Visit (HOSPITAL_BASED_OUTPATIENT_CLINIC_OR_DEPARTMENT_OTHER): Admitting: Physical Therapy

## 2024-04-30 ENCOUNTER — Encounter (HOSPITAL_BASED_OUTPATIENT_CLINIC_OR_DEPARTMENT_OTHER): Payer: Self-pay | Admitting: Physical Therapy

## 2024-04-30 DIAGNOSIS — R262 Difficulty in walking, not elsewhere classified: Secondary | ICD-10-CM | POA: Diagnosis not present

## 2024-04-30 DIAGNOSIS — M25562 Pain in left knee: Secondary | ICD-10-CM | POA: Diagnosis not present

## 2024-04-30 DIAGNOSIS — M25662 Stiffness of left knee, not elsewhere classified: Secondary | ICD-10-CM | POA: Diagnosis not present

## 2024-04-30 DIAGNOSIS — M6281 Muscle weakness (generalized): Secondary | ICD-10-CM | POA: Diagnosis not present

## 2024-04-30 DIAGNOSIS — R600 Localized edema: Secondary | ICD-10-CM | POA: Diagnosis not present

## 2024-04-30 NOTE — Therapy (Signed)
 OUTPATIENT PHYSICAL THERAPY TREATMENT   Patient Name: Brandi Morris MRN: [id number] DOB:31-Jan-1976, 48 y.o., female Today's Date: 04/30/2024  END OF SESSION:  PT End of Session - 04/30/24 1352     Visit Number 3    Number of Visits 18    Date for PT Re-Evaluation 07/03/24    Authorization Type [employer]-    Authorization Time Period 8 visits approved  From 05.23.2025 - 07.05.2025    Authorization - Visit Number 3    Authorization - Number of Visits 8    PT Start Time 1350    PT Stop Time 1430    PT Time Calculation (min) 40 min    Activity Tolerance Patient tolerated treatment well    Behavior During Therapy WFL for tasks assessed/performed          Past Medical History:  Diagnosis Date   Asthma    Exercise induced   Endometriosis    Family history of adverse reaction to anesthesia    Hyperlipidemia    Interstitial cystitis    Kidney stone    Migraine    Multiple allergies    Stroke (HCC)    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   Past Surgical History:  Procedure Laterality Date   ABDOMINAL HYSTERECTOMY     ABLATION     BIOPSY  08/28/2023   Procedure: BIOPSY;  Surgeon: Luke Salaam, MD;  Location: [facility] ENDOSCOPY;  Service: Gastroenterology;;   CERVICAL BIOPSY  W/ LOOP ELECTRODE EXCISION     CHOLECYSTECTOMY     COLONOSCOPY WITH PROPOFOL  N/A 08/28/2023   Procedure: COLONOSCOPY WITH PROPOFOL ;  Surgeon: Luke Salaam, MD;  Location: [facility] ENDOSCOPY;  Service: Gastroenterology;  Laterality: N/A;   COLPOSCOPY     CYSTOSTOMY W/ BLADDER BIOPSY     ESOPHAGOGASTRODUODENOSCOPY (EGD) WITH PROPOFOL  N/A 08/28/2023   Procedure: ESOPHAGOGASTRODUODENOSCOPY (EGD) WITH PROPOFOL ;  Surgeon: Luke Salaam, MD;  Location: [facility] ENDOSCOPY;  Service: Gastroenterology;  Laterality: N/A;   HEMOSTASIS CLIP PLACEMENT  08/28/2023   Procedure: HEMOSTASIS CLIP PLACEMENT;  Surgeon: Luke Salaam, MD;  Location: [facility] ENDOSCOPY;  Service: Gastroenterology;;   KNEE ARTHROSCOPY WITH MENISCAL  REPAIR Left 04/06/2024   Procedure: ARTHROSCOPY, KNEE, WITH MENISCUS REPAIR;  Surgeon: Wilhelmenia Harada, MD;  Location: [facility];  Service: Orthopedics;  Laterality: Left;   KNEE SURGERY     LEEP     POLYPECTOMY  08/28/2023   Procedure: POLYPECTOMY;  Surgeon: Luke Salaam, MD;  Location: [facility] ENDOSCOPY;  Service: Gastroenterology;;   RHINOPLASTY     TENDON REPAIR Left 07/26/2015   Procedure: REPAIR WITH RECONSTRUCTION LEFT THUMB;  Surgeon: Florida Hurter, MD;  Location: [facility] OR;  Service: Orthopedics;  Laterality: Left;   TONSILLECTOMY     TUBAL LIGATION     Patient Active Problem List   Diagnosis Date Noted   S/P left knee arthroscopy 01/01/24 medial meniscus repair 02/04/2024   Chronic pain of left knee 02/04/2024   Adenomatous polyp of colon 08/28/2023   Diarrhea 07/08/2023   Colon cancer screening 07/08/2023   Anxiety state 07/08/2023   Weight loss, abnormal 04/23/2023   Fever 04/23/2023   Night sweat 04/23/2023   Nausea and vomiting 04/23/2023   S/P cholecystectomy 04/23/2023   S/P hysterectomy 04/23/2023   Localized swelling of left thumb 09/06/2021   Osteoarthritis of carpometacarpal (CMC) joint of thumb 01/04/2021   Pain in thumb joint with movement, left 01/04/2021   Dyspareunia in female 03/23/2019   Chronic pelvic pain in female 03/23/2019   Hyperlipidemia  07/30/2007   Migraine 07/30/2007   ALLERGIC RHINITIS 07/30/2007   Asthma 07/30/2007   INTERSTITIAL CYSTITIS 07/30/2007   INSOMNIA 07/30/2007     REFERRING PROVIDER:  Wilhelmenia Harada, MD     REFERRING DIAG:  [phone] (ICD-10-CM) - S/P left knee arthroscopy    Left knee medial meniscal repair   Rationale for Evaluation and Treatment: Rehabilitation  THERAPY DIAG:  Acute pain of left knee  Stiffness of left knee, not elsewhere classified  Muscle weakness (generalized)  Localized edema  Difficulty in walking, not elsewhere classified  ONSET DATE: DOS 04/06/24 (previous on  01/01/24)   SUBJECTIVE:                                                                                                                                                                                           SUBJECTIVE STATEMENT: Patient states knee is super sore today from overdoing it yesterday. Was up and down stairs a lot yesterday. Busy day with son graduating.   PERTINENT HISTORY:  none  PAIN:  Are you having pain? Yes: NPRS scale: 5/10 Pain location: Lt knee Pain description: sore Aggravating factors: crossing legs in long sitting Relieving factors: iceman  PRECAUTIONS:  None  RED FLAGS: None   WEIGHT BEARING RESTRICTIONS:  WBAT  FALLS:  Has patient fallen in last 6 months? Yes. Number of falls 5- none post fall, all falls from knee buckling.   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    OBJECTIVE:  Note: Objective measures were completed at Evaluation unless otherwise noted.  PATIENT SURVEYS:  LEFS  Extreme difficulty/unable (0), Quite a bit of difficulty (1), Moderate difficulty (2), Little difficulty (3), No difficulty (4) Survey date:  04/09/24  Any of your usual work, housework or school activities 1  2. Usual hobbies, recreational or sporting activities 1  3. Getting into/out of the bath 3  4. Walking between rooms 4  5. Putting on socks/shoes 3  6. Squatting  0  7. Lifting an object, like a bag of groceries from the floor 4  8. Performing light activities around your home 4  9. Performing heavy activities around your home 0  10. Getting into/out of a car 3  11. Walking 2 blocks 0  12. Walking 1 mile 0  13. Going up/down 10 stairs (1 flight) 1  14. Standing for 1 hour 4  15.  sitting for 1 hour 4  16. Running on even ground 0  17. Running on uneven ground  0  18. Making sharp turns while running fast 0   19. Hopping  0  20. Rolling over in bed 4  Score total:  36/80     COGNITIVE STATUS: Within functional limits for tasks assessed   SENSATION: WFL  EDEMA:  Eval: minimal  GAIT: EVAL: no AD, circumduction and antalgic pattern, lacking knee flexion   Body Part #1 Knee  EVAL: ROM 0-105 04/23/24: 0 to 145                                                                                                                            TREATMENT DATE:  04/30/24 Manual: patellar mobs, STM quads, retrograde edema massage HR/ TR 1 x 20 each Step up 4 inch 2 x 10 Lateral step up 4 inch 2 x 10  Shuttle 50# 3 x 10   04/23/24 SLR 3 x 10 HR/ TR 1 x 20 each Step up 4 inch 2 x 10 Manual: STM to quads/adductors  04/09/24: See HEP Gait training- glut set in stance phase, trunk rotation/arm swing   PATIENT EDUCATION:  Education details: Anatomy of condition, POC, HEP, exercise form/rationale Person educated: Patient Education method: Explanation, Demonstration, Tactile cues, Verbal cues, and Handouts Education comprehension: verbalized understanding, returned demonstration, verbal cues required, tactile cues required, and needs further education  HOME EXERCISE PROGRAM: Access Code: [email or web address] URL: [email or web address] Date: 04/09/2024 Prepared by: Keven Pel  Program Notes squeeze glutes as you step on left leg, allow arms to swing  Exercises - Long Sitting Quad Set  - 3-5 x daily - 7 x weekly - 1 sets - 5 reps - 10s hold - Long Sitting Hamstring Stretch  - 3 x daily - 7 x weekly - 2 sets - 2 reps - 5 breaths hold   ASSESSMENT:  CLINICAL IMPRESSION: Patient with increased symptom and edema likely due to overdoing activity over last few days. Completed manual with improvement in swelling and symptoms following. Continued with quad strengthening. Patient will continue to benefit from physical therapy in order to improve function and reduce  impairment.     REHAB POTENTIAL: Good  CLINICAL DECISION MAKING: Stable/uncomplicated  EVALUATION COMPLEXITY: Low   GOALS: Goals reviewed with patient? Yes  SHORT TERM GOALS: Target date: 04/30/24  Demo 10 SLR without lag, without pain Baseline:will progress as appropriate Goal status: INITIAL  2.  Ambulation without sidebend/antalgic pattern Baseline: began practicing at eval Goal status: INITIAL    LONG TERM GOALS: Target date: POC date  LEFS to improve by MDC Baseline: see obj Goal status: INITIAL  2.  Navigate stairs with reciprocal pattern and proper form Baseline: will progress as appropriate Goal status: INITIAL  3.  Able to get up/down from floor to care for young niece and grandchildren Baseline: unable at eval Goal status: INITIAL  4.  Able to perform proper squat/lift form, minimal discomfort, to complete household construction projects Baseline: will progress as appropriate Goal status: INITIAL  PLAN:  PT FREQUENCY: 1-2x/week  PT DURATION: POC date  PLANNED INTERVENTIONS: 97164- PT Re-evaluation, 97750- Physical Performance Testing, 97110-Therapeutic exercises, 97530- Therapeutic activity, [id number]- Neuromuscular re-education, 97535- Self Care, 97140- Manual therapy, [phone]- Gait training, [phone]- Aquatic Therapy, Patient/Family education, Balance training, Stair training, Taping, Dry Needling, Joint mobilization, Spinal mobilization, Scar mobilization, and Cryotherapy.  PLAN FOR NEXT SESSION: per meniscal repair protocol    Beather Liming, PT, DPT 04/30/2024, 1:55 PM   For all possible CPT codes, reference the Planned Interventions line above.     Check all conditions that are expected to impact treatment: {Conditions expected to impact treatment:None of these apply   If treatment provided at initial evaluation, no treatment charged due to lack of authorization.

## 2024-05-04 ENCOUNTER — Ambulatory Visit (HOSPITAL_BASED_OUTPATIENT_CLINIC_OR_DEPARTMENT_OTHER): Admitting: Physical Therapy

## 2024-05-07 ENCOUNTER — Ambulatory Visit (HOSPITAL_BASED_OUTPATIENT_CLINIC_OR_DEPARTMENT_OTHER): Admitting: Physical Therapy

## 2024-05-07 ENCOUNTER — Encounter (HOSPITAL_BASED_OUTPATIENT_CLINIC_OR_DEPARTMENT_OTHER): Payer: Self-pay | Admitting: Physical Therapy

## 2024-05-07 DIAGNOSIS — R262 Difficulty in walking, not elsewhere classified: Secondary | ICD-10-CM

## 2024-05-07 DIAGNOSIS — M6281 Muscle weakness (generalized): Secondary | ICD-10-CM | POA: Diagnosis not present

## 2024-05-07 DIAGNOSIS — M25662 Stiffness of left knee, not elsewhere classified: Secondary | ICD-10-CM

## 2024-05-07 DIAGNOSIS — M25562 Pain in left knee: Secondary | ICD-10-CM

## 2024-05-07 DIAGNOSIS — R600 Localized edema: Secondary | ICD-10-CM

## 2024-05-07 NOTE — Therapy (Signed)
 OUTPATIENT PHYSICAL THERAPY TREATMENT   Patient Name: Brandi Morris MRN: [id number] DOB:[birth year], 48 y.o., female Today's Date: 05/07/2024  END OF SESSION:  PT End of Session - 05/07/24 1345     Visit Number 4    Number of Visits 18    Date for PT Re-Evaluation 07/03/24    Authorization Type [employer]-    Authorization Time Period 8 visits approved  From 05.23.2025 - 07.05.2025    Authorization - Visit Number 4    Authorization - Number of Visits 8    PT Start Time 1346    PT Stop Time 1426    PT Time Calculation (min) 40 min    Activity Tolerance Patient tolerated treatment well    Behavior During Therapy WFL for tasks assessed/performed          Past Medical History:  Diagnosis Date   Asthma    Exercise induced   Endometriosis    Family history of adverse reaction to anesthesia    Hyperlipidemia    Interstitial cystitis    Kidney stone    Migraine    Multiple allergies    Stroke (HCC)    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   Past Surgical History:  Procedure Laterality Date   ABDOMINAL HYSTERECTOMY     ABLATION     BIOPSY  08/28/2023   Procedure: BIOPSY;  Surgeon: Luke Salaam, MD;  Location: [facility] ENDOSCOPY;  Service: Gastroenterology;;   CERVICAL BIOPSY  W/ LOOP ELECTRODE EXCISION     CHOLECYSTECTOMY     COLONOSCOPY WITH PROPOFOL  N/A 08/28/2023   Procedure: COLONOSCOPY WITH PROPOFOL ;  Surgeon: Luke Salaam, MD;  Location: [facility] ENDOSCOPY;  Service: Gastroenterology;  Laterality: N/A;   COLPOSCOPY     CYSTOSTOMY W/ BLADDER BIOPSY     ESOPHAGOGASTRODUODENOSCOPY (EGD) WITH PROPOFOL  N/A 08/28/2023   Procedure: ESOPHAGOGASTRODUODENOSCOPY (EGD) WITH PROPOFOL ;  Surgeon: Luke Salaam, MD;  Location: [facility] ENDOSCOPY;  Service: Gastroenterology;  Laterality: N/A;   HEMOSTASIS CLIP PLACEMENT  08/28/2023   Procedure: HEMOSTASIS CLIP PLACEMENT;  Surgeon: Luke Salaam, MD;  Location: [facility] ENDOSCOPY;  Service: Gastroenterology;;   KNEE ARTHROSCOPY WITH MENISCAL  REPAIR Left 04/06/2024   Procedure: ARTHROSCOPY, KNEE, WITH MENISCUS REPAIR;  Surgeon: Wilhelmenia Harada, MD;  Location: [facility];  Service: Orthopedics;  Laterality: Left;   KNEE SURGERY     LEEP     POLYPECTOMY  08/28/2023   Procedure: POLYPECTOMY;  Surgeon: Luke Salaam, MD;  Location: [facility] ENDOSCOPY;  Service: Gastroenterology;;   RHINOPLASTY     TENDON REPAIR Left 07/26/2015   Procedure: REPAIR WITH RECONSTRUCTION LEFT THUMB;  Surgeon: Florida Hurter, MD;  Location: [facility] OR;  Service: Orthopedics;  Laterality: Left;   TONSILLECTOMY     TUBAL LIGATION     Patient Active Problem List   Diagnosis Date Noted   S/P left knee arthroscopy 01/01/24 medial meniscus repair 02/04/2024   Chronic pain of left knee 02/04/2024   Adenomatous polyp of colon 08/28/2023   Diarrhea 07/08/2023   Colon cancer screening 07/08/2023   Anxiety state 07/08/2023   Weight loss, abnormal 04/23/2023   Fever 04/23/2023   Night sweat 04/23/2023   Nausea and vomiting 04/23/2023   S/P cholecystectomy 04/23/2023   S/P hysterectomy 04/23/2023   Localized swelling of left thumb 09/06/2021   Osteoarthritis of carpometacarpal (CMC) joint of thumb 01/04/2021   Pain in thumb joint with movement, left 01/04/2021   Dyspareunia in female 03/23/2019   Chronic pelvic pain in female 03/23/2019   Hyperlipidemia  07/30/2007   Migraine 07/30/2007   ALLERGIC RHINITIS 07/30/2007   Asthma 07/30/2007   INTERSTITIAL CYSTITIS 07/30/2007   INSOMNIA 07/30/2007     REFERRING PROVIDER:  Wilhelmenia Harada, MD     REFERRING DIAG:  [phone] (ICD-10-CM) - S/P left knee arthroscopy    Left knee medial meniscal repair   Rationale for Evaluation and Treatment: Rehabilitation  THERAPY DIAG:  Acute pain of left knee  Stiffness of left knee, not elsewhere classified  Muscle weakness (generalized)  Localized edema  Difficulty in walking, not elsewhere classified  ONSET DATE: DOS 04/06/24 (previous on  01/01/24)   SUBJECTIVE:                                                                                                                                                                                           SUBJECTIVE STATEMENT: Patient states her knee is feeling good. Sharp pains on R side. Quit sitting with knees bent up really far.   PERTINENT HISTORY:  none  PAIN:  Are you having pain? Yes: NPRS scale: 3-4/10 Pain location: Lt knee Pain description: sore Aggravating factors: crossing legs in long sitting Relieving factors: iceman  PRECAUTIONS:  None  RED FLAGS: None   WEIGHT BEARING RESTRICTIONS:  WBAT  FALLS:  Has patient fallen in last 6 months? Yes. Number of falls 5- none post fall, all falls from knee buckling.   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    OBJECTIVE:  Note: Objective measures were completed at Evaluation unless otherwise noted.  PATIENT SURVEYS:  LEFS  Extreme difficulty/unable (0), Quite a bit of difficulty (1), Moderate difficulty (2), Little difficulty (3), No difficulty (4) Survey date:  04/09/24  Any of your usual work, housework or school activities 1  2. Usual hobbies, recreational or sporting activities 1  3. Getting into/out of the bath 3  4. Walking between rooms 4  5. Putting on socks/shoes 3  6. Squatting  0  7. Lifting an object, like a bag of groceries from the floor 4  8. Performing light activities around your home 4  9. Performing heavy activities around your home 0  10. Getting into/out of a car 3  11. Walking 2 blocks 0  12. Walking 1 mile 0  13. Going up/down 10 stairs (1 flight) 1  14. Standing for 1 hour 4  15.  sitting for 1 hour 4  16. Running on even ground 0  17. Running on uneven ground 0  18. Making  sharp turns while running fast 0  19. Hopping  0  20. Rolling  over in bed 4  Score total:  36/80     COGNITIVE STATUS: Within functional limits for tasks assessed   SENSATION: WFL  EDEMA:  Eval: minimal  GAIT: EVAL: no AD, circumduction and antalgic pattern, lacking knee flexion   Body Part #1 Knee  EVAL: ROM 0-105 04/23/24: 0 to 145                                                                                                                            TREATMENT DATE:  05/07/24 Manual: patellar mobs, STM quads, Scar mobs to medial scar LAQ  10x 5 second holds HR/ TR 1 x 20 each Step up 4 inch 2 x 10 Lateral step up 4 inch 2 x 10  SLS 3 x 30 second holds Shuttle 50# 3 x 10   04/30/24 Manual: patellar mobs, STM quads, retrograde edema massage HR/ TR 1 x 20 each Step up 4 inch 2 x 10 Lateral step up 4 inch 2 x 10  Shuttle 50# 3 x 10   04/23/24 SLR 3 x 10 HR/ TR 1 x 20 each Step up 4 inch 2 x 10 Manual: STM to quads/adductors  04/09/24: See HEP Gait training- glut set in stance phase, trunk rotation/arm swing   PATIENT EDUCATION:  Education details: Anatomy of condition, POC, HEP, exercise form/rationale Person educated: Patient Education method: Explanation, Demonstration, Tactile cues, Verbal cues, and Handouts Education comprehension: verbalized understanding, returned demonstration, verbal cues required, tactile cues required, and needs further education  HOME EXERCISE PROGRAM: Access Code: [email or web address] URL: [email or web address] Date: 04/09/2024 Prepared by: Keven Pel  Program Notes squeeze glutes as you step on left leg, allow arms to swing  Exercises - Long Sitting Quad Set  - 3-5 x daily - 7 x weekly - 1 sets - 5 reps - 10s hold - Long Sitting Hamstring Stretch  - 3 x daily - 7 x weekly - 2 sets - 2 reps - 5 breaths hold   ASSESSMENT:  CLINICAL IMPRESSION: Patient with improving knee symptoms and edema at medial incision following manual. Continued with quad strengthening. No c/o  symptoms during session today.  Patient will continue to benefit from physical therapy in order to improve function and reduce impairment.     REHAB POTENTIAL: Good  CLINICAL DECISION MAKING: Stable/uncomplicated  EVALUATION COMPLEXITY: Low   GOALS: Goals reviewed with patient? Yes  SHORT TERM GOALS: Target date: 04/30/24  Demo 10 SLR without lag, without pain Baseline:will progress as appropriate Goal status: INITIAL  2.  Ambulation without sidebend/antalgic pattern Baseline: began practicing at eval Goal status: INITIAL    LONG TERM GOALS: Target date: POC date  LEFS to improve by MDC Baseline: see obj Goal status: INITIAL  2.  Navigate stairs with reciprocal pattern and proper form Baseline: will progress as appropriate Goal status: INITIAL  3.  Able to get up/down  from floor to care for young niece and grandchildren Baseline: unable at eval Goal status: INITIAL  4.  Able to perform proper squat/lift form, minimal discomfort, to complete household construction projects Baseline: will progress as appropriate Goal status: INITIAL     PLAN:  PT FREQUENCY: 1-2x/week  PT DURATION: POC date  PLANNED INTERVENTIONS: 97164- PT Re-evaluation, 97750- Physical Performance Testing, 97110-Therapeutic exercises, 97530- Therapeutic activity, [id number]- Neuromuscular re-education, 97535- Self Care, 16109- Manual therapy, [phone]- Gait training, [phone]- Aquatic Therapy, Patient/Family education, Balance training, Stair training, Taping, Dry Needling, Joint mobilization, Spinal mobilization, Scar mobilization, and Cryotherapy.  PLAN FOR NEXT SESSION: per meniscal repair protocol    Beather Liming, PT, DPT 05/07/2024, 1:46 PM   For all possible CPT codes, reference the Planned Interventions line above.     Check all conditions that are expected to impact treatment: {Conditions expected to impact treatment:None of these apply   If treatment provided at initial evaluation,  no treatment charged due to lack of authorization.

## 2024-05-11 ENCOUNTER — Ambulatory Visit (HOSPITAL_BASED_OUTPATIENT_CLINIC_OR_DEPARTMENT_OTHER): Admitting: Physical Therapy

## 2024-05-14 ENCOUNTER — Telehealth (HOSPITAL_BASED_OUTPATIENT_CLINIC_OR_DEPARTMENT_OTHER): Payer: Self-pay | Admitting: Physical Therapy

## 2024-05-14 ENCOUNTER — Ambulatory Visit (HOSPITAL_BASED_OUTPATIENT_CLINIC_OR_DEPARTMENT_OTHER): Admitting: Physical Therapy

## 2024-05-14 NOTE — Telephone Encounter (Signed)
 Patient no show, left message to make patient aware of missed appointment and reminded her of next appointment.   3:11 PM, 05/14/24 Prentice CANDIE Stains PT, DPT Physical Therapist at [facility]

## 2024-05-26 ENCOUNTER — Telehealth (HOSPITAL_BASED_OUTPATIENT_CLINIC_OR_DEPARTMENT_OTHER): Payer: Self-pay | Admitting: Physical Therapy

## 2024-05-26 ENCOUNTER — Encounter (HOSPITAL_BASED_OUTPATIENT_CLINIC_OR_DEPARTMENT_OTHER): Payer: Self-pay | Admitting: Physical Therapy

## 2024-05-26 ENCOUNTER — Ambulatory Visit (HOSPITAL_BASED_OUTPATIENT_CLINIC_OR_DEPARTMENT_OTHER): Payer: Self-pay | Attending: Orthopaedic Surgery | Admitting: Physical Therapy

## 2024-05-26 DIAGNOSIS — R600 Localized edema: Secondary | ICD-10-CM | POA: Insufficient documentation

## 2024-05-26 DIAGNOSIS — R262 Difficulty in walking, not elsewhere classified: Secondary | ICD-10-CM | POA: Insufficient documentation

## 2024-05-26 DIAGNOSIS — M25662 Stiffness of left knee, not elsewhere classified: Secondary | ICD-10-CM | POA: Insufficient documentation

## 2024-05-26 DIAGNOSIS — M25562 Pain in left knee: Secondary | ICD-10-CM | POA: Insufficient documentation

## 2024-05-26 DIAGNOSIS — M6281 Muscle weakness (generalized): Secondary | ICD-10-CM | POA: Insufficient documentation

## 2024-05-26 NOTE — Therapy (Signed)
 [facility] Outpatient Rehabilitation at [facility] [address] [place], [place], [address] Phone: [phone]   Fax:  [phone]  Patient Details  Name: LORETA BLOUCH MRN: [id number] Date of Birth: [birth year] Referring Provider:  No ref. provider found  Encounter Date: 05/26/2024  PHYSICAL THERAPY DISCHARGE SUMMARY  Visits from Start of Care: 4  Current functional level related to goals / functional outcomes: Unknown as patient has not returned. Patient being discharged due to several no show appointments and clinic's attendance policy.    Remaining deficits: Unknown as patient has not returned   Education / Equipment: HEP   Patient agrees to discharge. Patient goals were not met. Patient is being discharged due to not returning since the last visit.   8:34 AM, 05/26/24 Prentice CANDIE Stains PT, DPT Physical Therapist at [facility] Rehabilitation at [facility] [address] [place], [place], [address] Phone: [phone]   Fax:  [phone]

## 2024-05-26 NOTE — Telephone Encounter (Signed)
 Patient no show, left message for patient to make her aware of missed appointment and reminded of attendance policy. Patient will be discharged due to attendance policy with several no show appointments and remaining appointments will be cancelled.   8:30 AM, 05/26/24 Prentice CANDIE Stains PT, DPT Physical Therapist at [facility]'

## 2024-05-29 DIAGNOSIS — Z419 Encounter for procedure for purposes other than remedying health state, unspecified: Secondary | ICD-10-CM | POA: Diagnosis not present

## 2024-06-01 ENCOUNTER — Encounter (HOSPITAL_BASED_OUTPATIENT_CLINIC_OR_DEPARTMENT_OTHER): Admitting: Physical Therapy

## 2024-06-03 ENCOUNTER — Other Ambulatory Visit: Payer: Self-pay | Admitting: Medical

## 2024-06-17 ENCOUNTER — Encounter (HOSPITAL_BASED_OUTPATIENT_CLINIC_OR_DEPARTMENT_OTHER): Admitting: Physical Therapy

## 2024-06-22 ENCOUNTER — Emergency Department
Admission: EM | Admit: 2024-06-22 | Discharge: 2024-06-22 | Disposition: A | Discharge status: Home / Self Care | Attending: Emergency Medicine | Admitting: Emergency Medicine

## 2024-06-22 ENCOUNTER — Encounter (HOSPITAL_BASED_OUTPATIENT_CLINIC_OR_DEPARTMENT_OTHER): Admitting: Physical Therapy

## 2024-06-22 DIAGNOSIS — Z23 Encounter for immunization: Secondary | ICD-10-CM | POA: Insufficient documentation

## 2024-06-22 DIAGNOSIS — S91311A Laceration without foreign body, right foot, initial encounter: Secondary | ICD-10-CM | POA: Diagnosis not present

## 2024-06-22 DIAGNOSIS — W232XXA Caught, crushed, jammed or pinched between a moving and stationary object, initial encounter: Secondary | ICD-10-CM | POA: Diagnosis not present

## 2024-06-22 MED ORDER — IBUPROFEN 600 MG PO TABS
600.0000 mg | ORAL_TABLET | Freq: Once | ORAL | Status: AC
  Administered 2024-06-22: 600 mg via ORAL
  Filled 2024-06-22: qty 1

## 2024-06-22 MED ORDER — LIDOCAINE HCL (PF) 1 % IJ SOLN
5.0000 mL | Freq: Once | INTRAMUSCULAR | Status: AC
  Administered 2024-06-22: 5 mL
  Filled 2024-06-22: qty 5

## 2024-06-22 MED ORDER — CEPHALEXIN 500 MG PO CAPS: 500.0000 mg | ORAL_CAPSULE | Freq: Two times a day (BID) | ORAL | 0 refills | Status: AC

## 2024-06-22 MED ORDER — IBUPROFEN 100 MG/5ML PO SUSP: 600.0000 mg | Freq: Once | ORAL | Status: DC

## 2024-06-22 MED ORDER — TETANUS-DIPHTH-ACELL PERTUSSIS 5-2.5-18.5 LF-MCG/0.5 IM SUSY
0.5000 mL | PREFILLED_SYRINGE | Freq: Once | INTRAMUSCULAR | Status: AC
  Administered 2024-06-22: 0.5 mL via INTRAMUSCULAR
  Filled 2024-06-22: qty 0.5

## 2024-06-22 NOTE — Discharge Instructions (Signed)
 Follow-up with your primary care provider in 7 to 10 days for suture removal.    Keep sutures dry for first 24 hrs. Keep suture site of clean & dry. Gently use soap & water after first 24 hrs. DO NOT USE alcohol, hydrogen peroxide etc, to clean skin. You may cover the incision with clean gauze & replace it after your daily shower for your comfort. If you have skin tapes (Steristrips) or skin glue (Dermabond) on your incision, leave them in place. They will fall off on their own like a scab in a few weeks.  You may trim any edges that curl up with clean scissors.

## 2024-06-22 NOTE — ED Triage Notes (Signed)
 Pt here for laceration on left heal. Pt has it bandaged. Heal got caught on door which ripped skin off.

## 2024-06-22 NOTE — ED Provider Notes (Signed)
 [facility] Emergency Department Provider Note     Event Date/Time   First MD Initiated Contact with Patient 06/22/24 1954     (approximate)   History   Laceration   HPI  Brandi Morris is a 48 y.o. female presents to the ED for a laceration to her left foot.  Patient reports she caught her heel on her screen door at home sustaining a laceration.  Tetanus not up-to-date.  No other complaints.      Physical Exam   Triage Vital Signs: ED Triage Vitals  Encounter Vitals Group     BP 06/22/24 1647 (!) 127/90     Girls Systolic BP Percentile --      Girls Diastolic BP Percentile --      Boys Systolic BP Percentile --      Boys Diastolic BP Percentile --      Pulse Rate 06/22/24 1647 78     Resp 06/22/24 1647 20     Temp 06/22/24 1647 98.8 F (37.1 C)     Temp Source 06/22/24 1647 Oral     SpO2 06/22/24 1647 99 %     Weight --      Height --      Head Circumference --      Peak Flow --      Pain Score 06/22/24 1648 0     Pain Loc --      Pain Education --      Exclude from Growth Chart --     Most recent vital signs: Vitals:   06/22/24 1647  BP: (!) 127/90  Pulse: 78  Resp: 20  Temp: 98.8 F (37.1 C)  SpO2: 99%    General Awake, no distress.  HEENT NCAT.  CV:  Good peripheral perfusion.  RESP:  Normal effort.  ABD:  No distention.  Other:  Approximately 3 cm irregular shaped laceration to the lateral distal portion of the Achilles tendon.  No involvement of the Achilles tendon.  Full range of motion with plantarflexion and dorsiflexion.  Neurovascular status intact all throughout.  Gait steady.   ED Results / Procedures / Treatments   Labs (all labs ordered are listed, but only abnormal results are displayed) Labs Reviewed - No data to display  No results found.  PROCEDURES:  Critical Care performed: No  .Laceration Repair  Date/Time: 06/22/2024 11:49 PM  Performed by: Margrette Monte A, PA-C Authorized by:  Margrette Monte A, PA-C   Consent:    Consent obtained:  Verbal   Consent given by:  Patient   Risks, benefits, and alternatives were discussed: yes     Risks discussed:  Infection, pain, need for additional repair and poor cosmetic result Universal protocol:    Patient identity confirmed:  Verbally with patient Anesthesia:    Anesthesia method:  Local infiltration   Local anesthetic:  Lidocaine  1% WITH epi Laceration details:    Location:  Foot   Foot location:  L heel   Length (cm):  3   Depth (mm):  1 Exploration:    Wound exploration: wound explored through full range of motion     Wound extent: no tendon damage   Treatment:    Area cleansed with:  Povidone-iodine and saline   Amount of cleaning:  Standard   Irrigation solution:  Sterile saline   Irrigation method:  Pressure wash Skin repair:    Repair method:  Sutures   Suture size:  4-0   Suture material:  Nylon   Suture technique:  Simple interrupted   Number of sutures:  7 Approximation:    Approximation:  Close Repair type:    Repair type:  Simple Post-procedure details:    Dressing:  Non-adherent dressing   Procedure completion:  Tolerated    MEDICATIONS ORDERED IN ED: Medications  ibuprofen  (ADVIL ) tablet 600 mg (600 mg Oral Given 06/22/24 1835)  lidocaine  (PF) (XYLOCAINE ) 1 % injection 5 mL (5 mLs Infiltration Given 06/22/24 2006)  Tdap (BOOSTRIX) injection 0.5 mL (0.5 mLs Intramuscular Given 06/22/24 2126)     IMPRESSION / MDM / ASSESSMENT AND PLAN / ED COURSE  I reviewed the triage vital signs and the nursing notes.                               47 y.o. female presents to the emergency department for evaluation and treatment of laceration. See HPI for further details.   Differential diagnosis includes, but is not limited to laceration, abrasion, tendon tear less likely  Patient's presentation is most consistent with acute complicated illness / injury requiring diagnostic workup.  Patient is alert and  oriented.  She is hemodynamically stable.  Physical exam findings are stated above.  Laceration repaired performed successfully.  Advised to return to ED or follow-up with PCP in 7 to 10 days for suture removal.  Tetanus administered during this ED visit.  Short course of antibiotics sent to pharmacy.  Patient stable condition for discharge home.  ED return precaution discussed.  FINAL CLINICAL IMPRESSION(S) / ED DIAGNOSES   Final diagnoses:  Foot laceration, right, initial encounter   Rx / DC Orders   ED Discharge Orders          Ordered    cephALEXin  (KEFLEX ) 500 MG capsule  2 times daily        06/22/24 2123           Note:  This document was prepared using Dragon voice recognition software and may include unintentional dictation errors.    Margrette, Shyonna Carlin A, PA-C 06/22/24 2353    Clarine Ozell LABOR, MD 06/27/24 [phone]

## 2024-06-22 NOTE — ED Notes (Signed)
 Pt state that she takes ibuprofen  every day and is asking for some for the pain in her foot

## 2024-06-24 ENCOUNTER — Encounter (HOSPITAL_BASED_OUTPATIENT_CLINIC_OR_DEPARTMENT_OTHER): Admitting: Physical Therapy

## 2024-06-29 ENCOUNTER — Encounter (HOSPITAL_BASED_OUTPATIENT_CLINIC_OR_DEPARTMENT_OTHER): Admitting: Physical Therapy

## 2024-06-29 DIAGNOSIS — Z419 Encounter for procedure for purposes other than remedying health state, unspecified: Secondary | ICD-10-CM | POA: Diagnosis not present

## 2024-07-02 ENCOUNTER — Encounter (HOSPITAL_BASED_OUTPATIENT_CLINIC_OR_DEPARTMENT_OTHER): Admitting: Physical Therapy

## 2024-07-03 ENCOUNTER — Ambulatory Visit
Admission: RE | Admit: 2024-07-03 | Discharge: 2024-07-03 | Disposition: A | Discharge status: Home / Self Care | Source: Ambulatory Visit

## 2024-07-03 VITALS — BP 128/83 | HR 77 | Temp 98.2°F | Resp 18 | Ht 62.0 in | Wt 176.0 lb

## 2024-07-03 DIAGNOSIS — Z4802 Encounter for removal of sutures: Secondary | ICD-10-CM

## 2024-07-03 NOTE — ED Triage Notes (Signed)
 Remove stitches from heel of right foot - Entered by patient  See ED Note below: Patient reported she caught her heel on her screen door at home sustaining a laceration. Approximately 3 cm irregular shaped laceration to the lateral distal portion of the Achilles tendon. Skin repair:    Repair method:  Sutures   Suture size:  4-0   Suture material:  Nylon   Suture technique:  Simple interrupted   Number of sutures:  7 By: Margrette Monte A, PA-C 06/22/24 2353

## 2024-07-03 NOTE — ED Provider Notes (Signed)
 [facility]  Note:  This document was prepared using Conservation officer, historic buildings and may include unintentional dictation errors.  MRN: [id number] DOB: 09/02/1976  Subjective:   Brandi Morris is a 48 y.o. female presenting for wound reevaluation and suture removal in urgent care.  Patient denies any secondary signs of infection.  Having mild discomfort secondary to initial injury.  No secondary medical concerns at this time.  No current facility-administered medications for this encounter.  Current Outpatient Medications:    dicyclomine  (BENTYL ) 10 MG capsule, Take 10 mg by mouth 4 (four) times daily., Disp: , Rfl:    sulfamethoxazole-trimethoprim (BACTRIM DS) 800-160 MG tablet, Take 1 tablet by mouth 3 (three) times a week., Disp: , Rfl:    albuterol  (VENTOLIN  HFA) 108 (90 Base) MCG/ACT inhaler, Inhale 1-2 puffs into the lungs every 6 (six) hours as needed., Disp: 18 g, Rfl: 0   bismuth  subsalicylate (PEPTO-BISMOL) 262 MG chewable tablet, Chew 2 tablets (524 mg total) by mouth in the morning and at bedtime., Disp: 60 tablet, Rfl: 0   colestipol  (COLESTID ) 1 g tablet, Take 2 tablets (2 g total) by mouth daily., Disp: 60 tablet, Rfl: 5   diphenhydrAMINE  (BENADRYL ) 25 MG tablet, Take 50 mg by mouth every 6 (six) hours as needed for itching or allergies. , Disp: , Rfl:    EPINEPHrine  (EPIPEN  2-PAK) 0.3 mg/0.3 mL IJ SOAJ injection, Inject 0.3 mg into the muscle once as needed (for severe allergic reaction)., Disp: , Rfl:    meloxicam  (MOBIC ) 7.5 MG tablet, Take 1 tablet by mouth 2 (two) times daily with a meal., Disp: , Rfl:    methylPREDNISolone  (MEDROL  DOSEPAK) 4 MG TBPK tablet, Take per packet instructions (Patient not taking: Reported on 03/30/2024), Disp: 1 each, Rfl: 0   omeprazole  (PRILOSEC) 40 MG capsule, Take 1 capsule (40 mg total) by mouth daily., Disp: 30 capsule, Rfl: 1   ondansetron  (ZOFRAN ) 4 MG tablet, Take 1 tablet (4 mg total) by mouth every 8 (eight) hours as  needed for nausea or vomiting., Disp: 30 tablet, Rfl: 1   oxyCODONE  (ROXICODONE ) 5 MG immediate release tablet, Take 1 tablet (5 mg total) by mouth every 4 (four) hours as needed for severe pain (pain score 7-10) or breakthrough pain., Disp: 15 tablet, Rfl: 0   traMADol  (ULTRAM ) 50 MG tablet, Take 1 tablet (50 mg total) by mouth every 6 (six) hours as needed for severe pain (pain score 7-10)., Disp: 30 tablet, Rfl: 0   Allergies  Allergen Reactions   Bee Venom Anaphylaxis   Flexeril [Cyclobenzaprine] Other (See Comments)    Reaction:  Makes pt violent    Lavender Oil Anaphylaxis and Other (See Comments)    Pt states that her lungs and sinuses fill-up with fluid.     Robaxin [Methocarbamol] Other (See Comments)    Reaction:  Makes pt violent    Shellfish Allergy Hives   Amitriptyline Other (See Comments)    Reaction:  Hallucinations    Entex Other (See Comments)    Reaction:  Dizziness    Singulair  [Montelukast  Sodium] Other (See Comments)    Reaction:  Agitation  Pt states that her sinuses fill-up with fluid.     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     Past Surgical History:  Procedure Laterality Date   ABDOMINAL HYSTERECTOMY     ABLATION     BIOPSY  08/28/2023   Procedure: BIOPSY;  Surgeon: Therisa Bi, MD;  Location: [facility] ENDOSCOPY;  Service: Gastroenterology;;   CERVICAL BIOPSY  W/ LOOP ELECTRODE EXCISION     CHOLECYSTECTOMY     COLONOSCOPY WITH PROPOFOL  N/A 08/28/2023   Procedure: COLONOSCOPY WITH PROPOFOL ;  Surgeon: Therisa Bi, MD;  Location: [facility] ENDOSCOPY;  Service: Gastroenterology;   Laterality: N/A;   COLPOSCOPY     CYSTOSTOMY W/ BLADDER BIOPSY     ESOPHAGOGASTRODUODENOSCOPY (EGD) WITH PROPOFOL  N/A 08/28/2023   Procedure: ESOPHAGOGASTRODUODENOSCOPY (EGD) WITH PROPOFOL ;  Surgeon: Therisa Bi, MD;  Location: [facility] ENDOSCOPY;  Service: Gastroenterology;  Laterality: N/A;   HEMOSTASIS CLIP PLACEMENT  08/28/2023   Procedure: HEMOSTASIS CLIP PLACEMENT;  Surgeon: Therisa Bi, MD;  Location: [facility] ENDOSCOPY;  Service: Gastroenterology;;   KNEE ARTHROSCOPY WITH MENISCAL REPAIR Left 04/06/2024   Procedure: ARTHROSCOPY, KNEE, WITH MENISCUS REPAIR;  Surgeon: Genelle Standing, MD;  Location: [facility];  Service: Orthopedics;  Laterality: Left;   KNEE SURGERY     LEEP     POLYPECTOMY  08/28/2023   Procedure: POLYPECTOMY;  Surgeon: Therisa Bi, MD;  Location: [facility] ENDOSCOPY;  Service: Gastroenterology;;   RHINOPLASTY     TENDON REPAIR Left 07/26/2015   Procedure: REPAIR WITH RECONSTRUCTION LEFT THUMB;  Surgeon: Donnice Robinsons, MD;  Location: [facility] OR;  Service: Orthopedics;  Laterality: Left;   TONSILLECTOMY     TUBAL LIGATION      Family History  Problem Relation Age of Onset   Heart disease Mother    COPD Mother    Diabetes Father    Hypertension Father    Stroke Father    Heart disease Father    Breast cancer Maternal Grandmother    Cancer Maternal Grandfather        melanoma   Heart disease Paternal Grandmother    Stroke Paternal Grandmother    Heart attack Paternal Grandmother    Heart disease Paternal Grandfather    Stroke Paternal Grandfather    Heart attack Paternal Grandfather     Social History   Tobacco Use   Smoking status: Former    Current packs/day: 1.00    Average packs/day: 1 pack/day for 25.0 years (25.0 ttl pk-yrs)    Types: Cigarettes   Smokeless tobacco: Never  Vaping Use   Vaping status: Never Used  Substance Use Topics   Alcohol use: Not Currently   Drug use: No    ROS Refer to HPI for ROS details.  Objective:    Vitals: BP 128/83 (BP Location: Left Arm)   Pulse 77   Temp 98.2 F (36.8 C) (Oral)   Resp 18   Ht 5' 2 (1.575 m)   Wt 176 lb (79.8 kg)   LMP 03/03/2007   SpO2 98%   BMI 32.19 kg/m   Physical Exam Vitals and nursing note reviewed.  Constitutional:      General: She is not in acute distress.    Appearance: Normal appearance. She is well-developed. She is not ill-appearing or toxic-appearing.  HENT:     Head: Normocephalic and atraumatic.  Cardiovascular:  Rate and Rhythm: Normal rate.  Pulmonary:     Effort: Pulmonary effort is normal. No respiratory distress.  Skin:    General: Skin is warm and dry.     Capillary Refill: Capillary refill takes less than 2 seconds.     Findings: Bruising, erythema and wound present.  Neurological:     General: No focal deficit present.     Mental Status: She is alert and oriented to person, place, and time.  Psychiatric:        Mood and Affect: Mood normal.        Behavior: Behavior normal.     Procedures  No results found for this or any previous visit (from the past 24 hours).  No results found.   Assessment and Plan :     Discharge Instructions       1. Visit for suture removal (Primary) - Sutures removed by Redell CMA in UC, no secondary signs of infection, no complications with removal. - 7 sutures in place and removed by CMA, wound site evaluated by provider prior to suture removal.       Ethel NOVAK Aurea Aurea, Jo Cerone B, NP 07/03/24 1403

## 2024-07-03 NOTE — Discharge Instructions (Addendum)
  1. Visit for suture removal (Primary) - Sutures removed by Redell CMA in UC, no secondary signs of infection, no complications with removal. - 7 sutures in place and removed by CMA, wound site evaluated by provider prior to suture removal.

## 2024-07-06 ENCOUNTER — Encounter (HOSPITAL_BASED_OUTPATIENT_CLINIC_OR_DEPARTMENT_OTHER): Admitting: Physical Therapy

## 2024-07-09 ENCOUNTER — Encounter (HOSPITAL_BASED_OUTPATIENT_CLINIC_OR_DEPARTMENT_OTHER): Admitting: Physical Therapy

## 2024-07-13 ENCOUNTER — Encounter (HOSPITAL_BASED_OUTPATIENT_CLINIC_OR_DEPARTMENT_OTHER): Admitting: Physical Therapy

## 2024-07-16 ENCOUNTER — Encounter (HOSPITAL_BASED_OUTPATIENT_CLINIC_OR_DEPARTMENT_OTHER): Admitting: Physical Therapy

## 2024-07-30 DIAGNOSIS — Z419 Encounter for procedure for purposes other than remedying health state, unspecified: Secondary | ICD-10-CM | POA: Diagnosis not present

## 2024-08-11 ENCOUNTER — Ambulatory Visit: Admitting: Medical

## 2024-08-11 VITALS — BP 122/78 | HR 77 | Wt 176.6 lb

## 2024-08-11 DIAGNOSIS — H9201 Otalgia, right ear: Secondary | ICD-10-CM

## 2024-08-11 DIAGNOSIS — J019 Acute sinusitis, unspecified: Secondary | ICD-10-CM | POA: Diagnosis not present

## 2024-08-11 DIAGNOSIS — H6121 Impacted cerumen, right ear: Secondary | ICD-10-CM

## 2024-08-11 MED ORDER — ONDANSETRON 4 MG PO TBDP: 4.0000 mg | ORAL_TABLET | Freq: Three times a day (TID) | ORAL | 0 refills | Status: AC | PRN

## 2024-08-11 MED ORDER — AMOXICILLIN-POT CLAVULANATE 875-125 MG PO TABS: 1.0000 | ORAL_TABLET | Freq: Two times a day (BID) | ORAL | 0 refills | Status: DC

## 2024-08-11 NOTE — Progress Notes (Signed)
 Subjective: Chief Complaint  Patient presents with   Acute Visit    Ear pain, stopped up ears, sinus pain, ear ringing for a month.     History of Present Illness [name] is a 48 year old female who presents with sinus and ear pain for three weeks.  She has been experiencing sinus pain and right ear pain for the past three weeks. Initially, she thought it was swimmer's ear after swimming with her children, as her ears started bothering her. She managed to get some water out, which temporarily relieved the symptoms, but the ear closed back off a few days later and has remained painful since then.  she notes right facial and sinus pain.    She describes intermittent fever-like symptoms, feeling 'burning up and pouring sweat' followed by 'skin cold and clammy'. She has also experienced nausea, vomiting, and diarrhea, which she attributes to the decongestants she has been taking. She has been using Tylenol  and ibuprofen  together, along with Dayquil and Mucinex Max, which provide some relief but do not completely alleviate the symptoms.  No coughing or sore throat. Her ear pain has worsened since yesterday, preventing her from sleeping last night. Laying on one side increases the pressure, while laying on the other side helps relieve it.  She has a history of producing a lot of ear wax and has had three sets of tubes in her ears. She cleaned her ear two days ago, but she reports that this ear produces a lot of wax and believes this contributes to her symptoms.  She quit smoking after a colonoscopy revealed seven precancerous lesions in her belly, and she now vapes instead of smoking cigarettes.  No other aggravating or relieving factors. No other complaint.   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 18 g 0   bismuth  subsalicylate (PEPTO-BISMOL) 262 MG chewable tablet Chew 2 tablets (524 mg total) by mouth in the morning and at bedtime. 60 tablet 0   diphenhydrAMINE  (BENADRYL ) 25 MG tablet Take 50 mg by mouth every 6 (six) hours as needed for itching or allergies.      EPINEPHrine  (EPIPEN  2-PAK) 0.3 mg/0.3 mL IJ SOAJ injection Inject 0.3 mg into the muscle once as needed (for severe allergic reaction).     meloxicam  (MOBIC ) 7.5 MG tablet Take 1 tablet by mouth 2 (two) times daily with a meal.     omeprazole  (PRILOSEC) 40 MG capsule Take 1 capsule (40 mg total) by mouth daily. 30 capsule 1   ondansetron  (ZOFRAN ) 4 MG tablet Take 1 tablet (4 mg total) by mouth every 8 (eight) hours as needed for nausea or vomiting. 30 tablet 1   colestipol  (COLESTID ) 1 g tablet Take 2 tablets (2 g total) by mouth daily. (Patient not taking: Reported on 08/11/2024) 60 tablet 5   dicyclomine  (BENTYL ) 10 MG capsule Take 10 mg by mouth 4 (four) times daily. (Patient not taking: Reported on 08/11/2024)  methylPREDNISolone  (MEDROL  DOSEPAK) 4 MG TBPK tablet Take per packet instructions (Patient not taking: Reported on 03/30/2024) 1 each 0   oxyCODONE  (ROXICODONE ) 5 MG immediate release tablet Take 1 tablet (5 mg total) by mouth every 4 (four) hours as needed for severe pain (pain score 7-10) or breakthrough pain. (Patient not taking: Reported on 08/11/2024) 15 tablet 0   sulfamethoxazole-trimethoprim (BACTRIM DS) 800-160 MG tablet Take 1 tablet by mouth 3 (three) times a week. (Patient not taking: Reported on 08/11/2024)     traMADol  (ULTRAM ) 50 MG tablet Take 1 tablet (50 mg total) by mouth every 6 (six) hours as needed for severe pain (pain score  7-10). 30 tablet 0   No current facility-administered medications on file prior to visit.    ROS as in subjective   Objective: BP 122/78   Pulse 77   Wt 176 lb 9.6 oz (80.1 kg)   LMP 03/03/2007   SpO2 98%   BMI 32.30 kg/m   General appearence: alert, no distress, WD/WN,  HEENT: normocephalic, sclerae anicteric, impacted cerumen right ear canal, moderate cerumen left canal but still can visualize TM, nares patent, no discharge or erythema, pharynx normal Oral cavity: MMM, no lesions Neck: supple, no lymphadenopathy, no thyromegaly, no masses Lungs: CTA bilaterally, no wheezes, rhonchi, or rales   Assessment: Encounter Diagnoses  Name Primary?   Acute non-recurrent sinusitis, unspecified location Yes   Ear pain, right    Impacted cerumen, right ear      Plan:  Acute right otalgia and possible otitis externa with impacted cerumen Right ear pain with impacted cerumen obstructing eardrum view. Possible otitis externa considered. - Attempt cerumen removal to visualize eardrum and relieve symptoms. -prescribed Augmentin   - Continue supportive measures over-the-counter with good hydration and Mucinex max a few more days to help with symptom  Acute sinus pain with nasal congestion Sinus pain and congestion for three weeks, worsening recently. Possible sinus infection considered. -prescribed Augmentin  -Continue supportive measures over-the-counter with good hydration and Mucinex max a few more days to help with symptom  Nausea with vomiting and diarrhea Nausea, vomiting, and diarrhea possibly related to decongestant use. - Prescribe Zofran  for as needed use  Tobacco use, current vaping quit smoking in past year, switched to vaping   Discussed findings.  Discussed risk/benefits of procedure and patient agrees to procedure. Successfully used warm water lavage to remove impacted cerumen from right ear canal. Patient tolerated procedure well. Advised they avoid using any cotton  swabs or other devices to clean the ear canals.  Use basic hygiene as discussed.  Follow up prn.    Brandi Morris was seen today for acute visit.  Diagnoses and all orders for this visit:  Acute non-recurrent sinusitis, unspecified location  Ear pain, right  Impacted cerumen, right ear  Other orders -     amoxicillin -clavulanate (AUGMENTIN ) 875-125 MG tablet; Take 1 tablet by mouth 2 (two) times daily. -     ondansetron  (ZOFRAN -ODT) 4 MG disintegrating tablet; Take 1 tablet (4 mg total) by mouth every 8 (eight) hours as needed for nausea or vomiting.    F/u prn

## 2024-08-16 ENCOUNTER — Ambulatory Visit: Admitting: Medical

## 2024-09-02 ENCOUNTER — Ambulatory Visit: Admitting: Medical

## 2024-09-02 VITALS — BP 122/80 | HR 73 | Wt 182.4 lb

## 2024-09-02 DIAGNOSIS — Z803 Family history of malignant neoplasm of breast: Secondary | ICD-10-CM | POA: Diagnosis not present

## 2024-09-02 DIAGNOSIS — N951 Menopausal and female climacteric states: Secondary | ICD-10-CM | POA: Diagnosis not present

## 2024-09-02 DIAGNOSIS — K219 Gastro-esophageal reflux disease without esophagitis: Secondary | ICD-10-CM | POA: Diagnosis not present

## 2024-09-02 DIAGNOSIS — R454 Irritability and anger: Secondary | ICD-10-CM | POA: Diagnosis not present

## 2024-09-02 DIAGNOSIS — L989 Disorder of the skin and subcutaneous tissue, unspecified: Secondary | ICD-10-CM

## 2024-09-02 DIAGNOSIS — Z90710 Acquired absence of both cervix and uterus: Secondary | ICD-10-CM | POA: Diagnosis not present

## 2024-09-02 DIAGNOSIS — G4700 Insomnia, unspecified: Secondary | ICD-10-CM | POA: Diagnosis not present

## 2024-09-02 DIAGNOSIS — Z8249 Family history of ischemic heart disease and other diseases of the circulatory system: Secondary | ICD-10-CM | POA: Diagnosis not present

## 2024-09-02 DIAGNOSIS — Z720 Tobacco use: Secondary | ICD-10-CM

## 2024-09-02 MED ORDER — OMEPRAZOLE 40 MG PO CPDR: 40.0000 mg | DELAYED_RELEASE_CAPSULE | Freq: Every day | ORAL | 1 refills | Status: AC

## 2024-09-02 MED ORDER — EPINEPHRINE 0.3 MG/0.3ML IJ SOAJ: 0.3000 mg | Freq: Once | INTRAMUSCULAR | 1 refills | Status: AC | PRN

## 2024-09-02 MED ORDER — DICYCLOMINE HCL 10 MG PO CAPS: 10.0000 mg | ORAL_CAPSULE | Freq: Four times a day (QID) | ORAL | 2 refills | Status: AC

## 2024-09-02 MED ORDER — HYDROXYZINE HCL 10 MG PO TABS: 10.0000 mg | ORAL_TABLET | Freq: Two times a day (BID) | ORAL | 0 refills | Status: DC

## 2024-09-02 MED ORDER — VENLAFAXINE HCL ER 37.5 MG PO CP24: 37.5000 mg | ORAL_CAPSULE | Freq: Every day | ORAL | 1 refills | Status: DC

## 2024-09-02 NOTE — Patient Instructions (Addendum)
 Begin Effexor/venlafaxine daily in the morning Begin Hydroxyzine at lunch and at bedtime to help with sleep, irritability, anxiety   Menopause: What to Know Menopause is the time in your life when your menstrual periods stop. It marks the end of your ability to get pregnant. It can be defined as not having a period for 12 months without another medical cause. The time when you start to move into menopause is called perimenopause. It often happens between ages 85-55. It can last for many years. During perimenopause, hormone levels change in your body. This can cause symptoms and affect your health. Menopause may make you more likely to have: Bones that are weak and break more easily. Depression. This is when you feel sad or hopeless. Arteries that harden and get narrow. These can cause heart attacks and strokes. What are the causes? In most cases, menopause is a natural change to your body and hormone levels that happens as you get older. But in some cases, it may be caused by changes that aren't natural. These include: Surgery to take out both ovaries. Side effects from some medicines. What increases the risk? You're more likely to go through menopause early if: You have an abnormal growth (tumor) of the pituitary gland in your brain. You have a disease that affects your ovaries. You've had certain treatments for cancer. These include: Chemotherapy. Hormone therapy. Radiation therapy on the area between your hips (pelvis). You smoke a lot or drink a lot of alcohol. Other people in your family have gone through menopause early. You're very thin. What are the signs or symptoms? You may have: Hot flashes. Irregular periods. Night sweats. Changes in how you feel about sex. You may: Have less of a sex drive. Feel more discomfort around your sexuality. Vaginal dryness and thinning of the vaginal walls. This may make it hurt to have sex. Skin changes, such as: Dry skin. New  wrinkles. Headaches. Other symptoms may include: Trouble sleeping. Mood swings. Memory problems. Weight gain. Hair growth on your face and chest. Bladder infections or trouble peeing. How is this diagnosed? You may be diagnosed based on: Your medical history. An exam. Your age. Your history of menstrual periods. Your symptoms. Hormone tests. How is this treated? In some cases, no treatment is needed. Talk with your health care provider about if you should get treated. Treatments may include: Menopausal hormone therapy (MHT). Medicines to treat certain symptoms. Acupuncture. Vitamin or herbal supplements. Before you start treatment, let your provider know if you or anyone in your family has or has had: Heart disease. Breast cancer. Blood clots. Diabetes. Osteoporosis. Follow these instructions at home: Eating and drinking  Eat a balanced diet. It should include: Fresh fruits and vegetables. Whole grains. Lean protein. Low-fat dairy. Eat lots of foods that have calcium and vitamin D in them. These can help keep your bones healthy. Foods and drinks that are rich in calcium include: Yogurt and low-fat milk. Beans. Almonds. Sardines. Broccoli and kale. To help prevent hot flashes, stay away from: Alcohol. Drinks with caffeine in them. Spicy foods. Lifestyle Do not smoke, vape, or use nicotine or tobacco. Get 7-8 hours of sleep each night. If you have hot flashes, you may want to: Dress in layers. Avoid things that may trigger hot flashes, like warm places or stress. Take slow, deep breaths when a hot flash starts. Keep a fan in your home and office. Find ways to manage stress. You may want to try: Deep breathing. Meditation. Writing in  a journal. Ask your provider about going to group therapy. Therapy can help you get support from others who are going through menopause. General instructions  Talk with your provider before you take any herbal  supplements. Keep track of your symptoms. Track: When they start. How often you have them. How long they last. Use vaginal lubricants or moisturizers. These can help with: Vaginal dryness. Comfort during sex. Contact a health care provider if: You're older than 55 and still get periods. You have pain during sex. You haven't had a period for 12 months and then start to bleed from your vagina. It hurts to pee. You get very bad headaches. Get help right away if: You're very depressed. You have a lot of bleeding from your vagina. Your heart is beating too fast. You have very bad belly pain or indigestion that doesn't go away with medicines. This information is not intended to replace advice given to you by your health care provider. Make sure you discuss any questions you have with your health care provider. Document Revised: 07/10/2023 Document Reviewed: 07/10/2023 Elsevier Patient Education  2024 [employer].

## 2024-09-02 NOTE — Progress Notes (Signed)
 Subjective:   Name: Brandi Morris   Date of Visit: 09/02/24   Date of last visit with me: 08/11/2024   CHIEF COMPLAINT:  Chief Complaint  Patient presents with   Acute Visit    Menopause x 5 months. Can't control emotions at all. Bruising easy       HPI:  Discussed the use of AI scribe software for clinical note transcription with the patient, who gave verbal consent to proceed.  History of Present Illness   Brandi Morris is a 48 year old female who presents with menopausal symptoms.  She experiences severe hot sweats, necessitating two showers a day and daily changes of bed sheets. She describes these symptoms as 'absolutely miserable.'  She reports significant emotional instability, with her emotions being 'all over the place' and her temper affecting household dynamics. She feels unable to control her emotions, experiencing sudden shifts from calm to shaking with anger over minor issues.  She has noticed significant hair loss, describing it as falling out in 'huge clumps' when she brushes her hair, which led her to cut her hair.  Her last menstrual period was in April 2008 following an ablation and hysterectomy, but she retains her ovaries.  She quit smoking in October after discovering seven precancerous lesions but continues to vape due to nicotine dependence.  She reports poor sleep, averaging about three hours per night, and describes her sleep as part of the problem.  She experiences constant knee pain following multiple failed surgeries, which resulted in job loss. She is currently seeking employment.  She takes Bentyl  and omeprazole  as needed for stomach issues, with Bentyl  used once or twice a week when her stomach is 'on fire.'  She has a significant family history of cancer, with her maternal grandmother and two maternal aunts having had breast and ovarian cancer, and her maternal grandfather having died of melanoma. Her mother was healthy but died from a  UTI.  She has a family history of heart disease, with her father having died at 43 from heart-related issues, including strokes and heart attacks.  She tries to maintain a healthy diet, avoiding fried foods and pork, and does not consume alcohol or drugs due to a family history of addiction. Her only vice is nicotine.  She has tried some antidepressants before, Zoloft  did not help all that much.  She has also tried Wellbutrin that did not work out well for her.  She is not sure about other medication trials for mental health medicines in the past.  She would like a referral to dermatology for 2 skin lesions of the face 1 on the left cheek on the right nose.  She needs refills on EpiPen , acid reflux medicine and stomach medicine Bentyl   No other aggravating or relieving factors. No other complaint.   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 18 g 0   bismuth  subsalicylate (PEPTO-BISMOL) 262 MG chewable tablet Chew 2 tablets (524  mg total) by mouth in the morning and at bedtime. 60 tablet 0   colestipol  (COLESTID ) 1 g tablet Take 2 tablets (2 g total) by mouth daily. 60 tablet 5   diphenhydrAMINE  (BENADRYL ) 25 MG tablet Take 50 mg by mouth every 6 (six) hours as needed for itching or allergies.      ondansetron  (ZOFRAN -ODT) 4 MG disintegrating tablet Take 1 tablet (4 mg total) by mouth every 8 (eight) hours as needed for nausea or vomiting. 20 tablet 0   No current facility-administered medications on file prior to visit.   Family  History  Problem Relation Age of Onset   Heart disease Mother    COPD Mother    Diabetes Father    Hypertension Father    Stroke Father    Heart disease Father    Breast cancer Maternal Grandmother    Cancer Maternal Grandfather        melanoma   Heart disease Paternal Grandmother    Stroke Paternal Grandmother    Heart attack Paternal Grandmother    Heart disease Paternal Grandfather    Stroke Paternal Grandfather    Heart attack Paternal Grandfather     ROS as in subjective    Objective: BP 122/80   Pulse 73   Wt 182 lb 6.4 oz (82.7 kg)   LMP 03/03/2007   BMI 33.36 kg/m   Gen: wd, wn ,nad Psych: tearful at times, anxious at times Irritated purplish raised lesion on left cheeck, small pearly fleshy leasion right nare     Assessment: Encounter Diagnoses  Name Primary?   Perimenopausal Yes   Skin lesion    Anger    Irritability    S/P hysterectomy    Gastroesophageal reflux disease, unspecified whether esophagitis present    Vapes nicotine containing substance    Family history of breast cancer    Family history of heart disease    Insomnia, unspecified type      Plan: perimenopausal syndrome, anger, irriability, insomnia Symptoms consistent with hormonal changes post-hysterectomy. Family history and nicotine use increase hormone therapy risks. - Order blood work for hormone levels: FSH, LH, estrogen. - Prescribe Effexor 37.5 mg extended release daily. - Prescribe hydroxyzine 10 mg twice daily as needed. - we will avoid HRT given her underlying risks with family history and vaping use - Recommend telehealth counseling, referral placed. - Provide menopause educational handout. - Encourage regular exercise, hydration, healthy diet. - Discuss deep breathing exercises and stress management.  Facial skin lesion Painful lesion causing discomfort and inability to wear makeup. - Refer to dermatology for evaluation and management.  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 No further surgical intervention planned.  Gastroesophageal reflux disease GERD managed with omeprazole . - Prescribe omeprazole .  Irritable bowel syndrome IBS with intermittent symptoms managed with Bentyl . - Prescribe Bentyl  as needed for abdominal cramping.  Hx/o allergic reaction. Requires EpiPen . - Prescribe EpiPen  for emergency use.  Nicotine dependence, currently vaping Nicotine dependence with current vaping. History of smoking cessation.   Royce was seen today for acute visit.  Diagnoses and all orders for this visit:  Perimenopausal -     Ambulatory referral to Psychology -     FSH/LH -     Estrogens, Total -     TSH  Skin lesion -     Ambulatory referral to Dermatology  Anger -     Ambulatory referral to Psychology  Irritability -     Ambulatory  referral to Psychology -     TSH  S/P hysterectomy  Gastroesophageal reflux disease, unspecified whether esophagitis present  Vapes nicotine containing substance  Family history of breast cancer  Family history of heart disease  Insomnia, unspecified type  Other orders -     dicyclomine  (BENTYL ) 10 MG capsule; Take 1 capsule (10 mg total) by mouth 4 (four) times daily. -     EPINEPHrine  (EPIPEN  2-PAK) 0.3 mg/0.3 mL IJ SOAJ injection; Inject 0.3 mg into the muscle once as needed (for severe allergic reaction). -     omeprazole  (PRILOSEC) 40 MG capsule; Take 1 capsule (40 mg total) by mouth daily. -     venlafaxine XR (EFFEXOR XR) 37.5 MG 24 hr capsule; Take 1 capsule (37.5 mg total) by mouth daily with breakfast. -     hydrOXYzine (ATARAX) 10 MG tablet; Take 1 tablet (10 mg total) by mouth in the morning and at bedtime.    F/u 69mo

## 2024-09-03 ENCOUNTER — Ambulatory Visit: Payer: Self-pay | Admitting: Medical

## 2024-09-03 ENCOUNTER — Other Ambulatory Visit: Payer: Self-pay | Admitting: Medical

## 2024-09-03 DIAGNOSIS — B9689 Other specified bacterial agents as the cause of diseases classified elsewhere: Secondary | ICD-10-CM

## 2024-09-03 LAB — SPECIMEN STATUS REPORT

## 2024-09-03 LAB — ESTROGENS, TOTAL: Estrogen: 64 pg/mL

## 2024-09-03 LAB — FSH/LH
FSH: 86.9 m[IU]/mL
LH: 61.6 m[IU]/mL

## 2024-09-03 LAB — TSH: TSH: 0.77 u[IU]/mL (ref 0.450–4.500)

## 2024-09-03 MED ORDER — ALBUTEROL SULFATE HFA 108 (90 BASE) MCG/ACT IN AERS: 1.0000 | INHALATION_SPRAY | Freq: Four times a day (QID) | RESPIRATORY_TRACT | 0 refills | Status: AC | PRN

## 2024-09-03 NOTE — Progress Notes (Signed)
 Results to MyChart

## 2024-09-08 ENCOUNTER — Other Ambulatory Visit: Payer: Self-pay | Admitting: Medical Genetics

## 2024-09-08 DIAGNOSIS — Z006 Encounter for examination for normal comparison and control in clinical research program: Secondary | ICD-10-CM

## 2024-09-20 ENCOUNTER — Encounter: Payer: Self-pay | Admitting: Radiology

## 2024-09-30 ENCOUNTER — Telehealth (INDEPENDENT_AMBULATORY_CARE_PROVIDER_SITE_OTHER): Payer: Self-pay | Admitting: Medical

## 2024-09-30 VITALS — BP 118/69

## 2024-09-30 DIAGNOSIS — N951 Menopausal and female climacteric states: Secondary | ICD-10-CM

## 2024-09-30 DIAGNOSIS — L989 Disorder of the skin and subcutaneous tissue, unspecified: Secondary | ICD-10-CM | POA: Diagnosis not present

## 2024-09-30 DIAGNOSIS — G43809 Other migraine, not intractable, without status migrainosus: Secondary | ICD-10-CM | POA: Diagnosis not present

## 2024-09-30 DIAGNOSIS — R61 Generalized hyperhidrosis: Secondary | ICD-10-CM

## 2024-09-30 DIAGNOSIS — G4700 Insomnia, unspecified: Secondary | ICD-10-CM

## 2024-09-30 DIAGNOSIS — R5383 Other fatigue: Secondary | ICD-10-CM

## 2024-09-30 DIAGNOSIS — R454 Irritability and anger: Secondary | ICD-10-CM | POA: Diagnosis not present

## 2024-09-30 DIAGNOSIS — Z90710 Acquired absence of both cervix and uterus: Secondary | ICD-10-CM

## 2024-09-30 MED ORDER — FLUOXETINE HCL 10 MG PO TABS: 10.0000 mg | ORAL_TABLET | Freq: Every day | ORAL | 1 refills | Status: AC

## 2024-09-30 NOTE — Progress Notes (Signed)
 Sent to [facility] referral neighborhood team to handle and check the status of referral

## 2024-09-30 NOTE — Progress Notes (Signed)
 Name: Brandi Morris   Date of Visit: 09/30/24   Date of last visit with me: 09/02/2024   CHIEF COMPLAINT:  Chief Complaint  Patient presents with   Follow-up    Follow-up on perimenopausal, couldn't take the medication that was prescribe due to severe migraines that lasted 2 weeks, both medications caused the migraines, once stopped medications- migraine went away. She was wondering if she can get placed on Buspar         This visit is being conducted as a virtual visit.  Patient is at home, I am present in my office   HPI:   Brandi Morris is a 48 year old female who presents with menopausal symptoms.  At her last visit she was complaining of menopausal symptoms, severe hot sweats, necessitating two showers a day and daily changes of bed sheets. She describes these symptoms as 'absolutely miserable.'  She described emotional instability, with her emotions being 'all over the place' and her temper affecting household dynamics. She feels unable to control her emotions, experiencing sudden shifts from calm to shaking with anger over minor issues.  She has noticed significant hair loss, describing it as falling out in 'huge clumps' when she brushes her hair, which led her to cut her hair. Her last menstrual period was in April 2008 following an ablation and hysterectomy, but she retains her ovaries. She quit smoking in October after discovering seven precancerous lesions but continues to vape due to nicotine dependence. She reports poor sleep, averaging about three hours per night, and describes her sleep as part of the problem.  She tries to maintain a healthy diet, avoiding fried foods and pork, and does not consume alcohol or drugs due to a family history of addiction. Her only vice is nicotine.  She has tried some antidepressants before, Zoloft  did not help all that much.  She has also tried Wellbutrin that did not work out well for her.  She is not sure about other medication  trials for mental health medicines in the past.  Since last visit we initiated a trial of Effexor and a trial of hydroxyzine.  She notes a history of migraines but both medicines seem to really put her migraines in overdrive.  She was not having headaches that frequently prior to the medicines last visit.  She cannot tolerate Effexor or hydroxyzine.  She tried it for 2 weeks and continue to have migraines.  She eventually weaned herself off and the migraines went away  She has not heard yet back about referral to counseling from last visit  She did hear back about referral to dermatology at her request for skin lesions on the face but the soonest appointment was well into next year.  They are looking into another option for her.  No other aggravating or relieving factors. No other complaint.   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 18 g 0   bismuth  subsalicylate (PEPTO-BISMOL) 262  MG chewable tablet Chew 2 tablets (524 mg total) by mouth in the morning and at bedtime. 60 tablet 0   colestipol  (COLESTID ) 1 g tablet Take 2 tablets (2 g total) by mouth daily. 60 tablet 5   dicyclomine  (BENTYL ) 10 MG capsule Take 1 capsule (10 mg total) by mouth 4 (four) times daily. 30 capsule 2   diphenhydrAMINE  (BENADRYL ) 25 MG tablet Take 50 mg by mouth every 6 (six) hours as needed for itching or allergies.      EPINEPHrine  (EPIPEN  2-PAK) 0.3 mg/0.3 mL IJ SOAJ injection Inject 0.3 mg into the muscle once as needed (for severe allergic reaction). 1 each 1    omeprazole  (PRILOSEC) 40 MG capsule Take 1 capsule (40 mg total) by mouth daily. 90 capsule 1   ondansetron  (ZOFRAN -ODT) 4 MG disintegrating tablet Take 1 tablet (4 mg total) by mouth every 8 (eight) hours as needed for nausea or vomiting. 20 tablet 0   No current facility-administered medications on file prior to visit.   Family History  Problem Relation Age of Onset   Heart disease Mother    COPD Mother    Diabetes Father    Hypertension Father    Stroke Father    Heart disease Father    Breast cancer Maternal Grandmother    Cancer Maternal Grandfather        melanoma   Heart disease Paternal Grandmother    Stroke Paternal Grandmother    Heart attack Paternal Grandmother    Heart disease Paternal Grandfather    Stroke Paternal Grandfather    Heart attack Paternal Grandfather     ROS as in subjective    Objective: BP 118/69   LMP 03/03/2007   Gen: wd, wn ,nad Psych: Pleasant, answers questions appropriately   Assessment: Encounter Diagnoses  Name Primary?   Fatigue, unspecified type Yes   Perimenopausal    Irritability    Insomnia, unspecified type    S/P hysterectomy    Night sweat    Other migraine without status migrainosus, not intractable    Skin lesion       Plan: Perimenopausal, anger, irritability, insomnia Symptoms consistent with hormonal changes post-hysterectomy. Family history and nicotine use increase hormone therapy risks. - She did not tolerate Effexor or hydroxyzine from last visit - we will avoid HRT given her underlying risks with family history and vaping use - Recommend telehealth counseling, referral placed. - Begin trial of fluoxetine Prozac.  Will take it slowly and start out with a low dose and work her way up -If for some reason if she does not tolerate this however we may try clonidine twice daily to help with hot flashes specifically in sleep - Encourage regular exercise, hydration, healthy diet. - Discuss deep breathing  exercises and stress management.  Facial skin lesion Painful lesion causing discomfort and inability to wear makeup. - She is waiting on the referral coordinator to get her a different appointment since the first available was too far out  Migraines - Consider Nurtec oral tablet or Aimovig monthly injection to help with prevention if she starts getting more frequent migraines  Fatigue, lack of energy - Begin trial of fluoxetine Prozac for this symptom as well as perimenopausal symptoms -Advise she begin daily multivitamin, daily 1000 unit vitamin D and daily B12 1000 mcg supplement over-the-counter -She can start each of these one at a time in the event of side effects so we can track down which will not cause the side effect  Kynlea  was seen today for follow-up.  Diagnoses and all orders for this visit:  Fatigue, unspecified type  Perimenopausal  Irritability  Insomnia, unspecified type  S/P hysterectomy  Night sweat  Other migraine without status migrainosus, not intractable  Skin lesion  Other orders -     FLUoxetine (PROZAC) 10 MG tablet; Take 1 tablet (10 mg total) by mouth daily.     F/u-with call report in 2 weeks

## 2024-10-04 ENCOUNTER — Other Ambulatory Visit (HOSPITAL_COMMUNITY): Payer: Self-pay

## 2024-10-18 ENCOUNTER — Other Ambulatory Visit (HOSPITAL_COMMUNITY): Payer: Self-pay

## 2024-10-18 ENCOUNTER — Telehealth: Payer: Self-pay

## 2024-10-18 NOTE — Telephone Encounter (Signed)
 Pharmacy Patient Advocate Encounter  Insurance verification completed.   The patient is insured through [facility] MEDICAID   Ran test claim for FLUoxetine  (PROZAC ) 10 MG tablet . Currently a quantity of 30 is a 30 day supply and the co-pay is 4.00 . No P/a is needed and Medicaid only covers (Capsule form). I have contacted the pts pharmacy and the Rx has been filled multiple times and returned due to Rx not being picked up.     This test claim was processed through [facility]- copay amounts may vary at other pharmacies due to pharmacy/plan contracts, or as the patient moves through the different stages of their insurance plan.

## 2024-12-21 ENCOUNTER — Telehealth: Admitting: Physician Assistant

## 2024-12-21 DIAGNOSIS — H6993 Unspecified Eustachian tube disorder, bilateral: Secondary | ICD-10-CM | POA: Diagnosis not present

## 2024-12-21 DIAGNOSIS — H8113 Benign paroxysmal vertigo, bilateral: Secondary | ICD-10-CM

## 2024-12-21 MED ORDER — MECLIZINE HCL 25 MG PO TABS: 25.0000 mg | ORAL_TABLET | Freq: Three times a day (TID) | ORAL | 0 refills | Status: AC | PRN

## 2024-12-21 MED ORDER — IPRATROPIUM BROMIDE 0.03 % NA SOLN: 2.0000 | Freq: Two times a day (BID) | NASAL | 0 refills | Status: AC
